# Patient Record
Sex: Male | Born: 1995 | Hispanic: Yes | Marital: Single | Smoking: Former smoker
Health system: Southern US, Community
[De-identification: ages and names within clinical notes are randomized; demographics above are authoritative.]

## PROBLEM LIST (undated history)

## (undated) DIAGNOSIS — R519 Headache, unspecified: Secondary | ICD-10-CM

## (undated) DIAGNOSIS — F319 Bipolar disorder, unspecified: Secondary | ICD-10-CM

---
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
## 2015-06-22 ENCOUNTER — Ambulatory Visit (INDEPENDENT_AMBULATORY_CARE_PROVIDER_SITE_OTHER): Payer: Self-pay | Admitting: Family Medicine

## 2015-06-22 VITALS — BP 108/64 | HR 91 | Temp 98.4°F | Resp 16 | Ht 68.0 in | Wt 144.8 lb

## 2015-06-22 DIAGNOSIS — N179 Acute kidney failure, unspecified: Secondary | ICD-10-CM

## 2015-06-22 DIAGNOSIS — R5381 Other malaise: Secondary | ICD-10-CM

## 2015-06-22 DIAGNOSIS — J029 Acute pharyngitis, unspecified: Secondary | ICD-10-CM

## 2015-06-22 LAB — POCT URINALYSIS DIPSTICK
Glucose, UA: NEGATIVE
KETONES UA: 15
Leukocytes, UA: NEGATIVE
Nitrite, UA: NEGATIVE
PROTEIN UA: 30
UROBILINOGEN UA: 1
pH, UA: 5

## 2015-06-22 LAB — POCT CBC
Granulocyte percent: 69.8 %G (ref 37–80)
HEMATOCRIT: 53.4 % (ref 43.5–53.7)
Hemoglobin: 17.4 g/dL (ref 14.1–18.1)
LYMPH, POC: 2.6 (ref 0.6–3.4)
MCH, POC: 30.2 pg (ref 27–31.2)
MCHC: 32.7 g/dL (ref 31.8–35.4)
MCV: 92.4 fL (ref 80–97)
MID (CBC): 1.1 — AB (ref 0–0.9)
MPV: 8.1 fL (ref 0–99.8)
PLATELET COUNT, POC: 296 10*3/uL (ref 142–424)
POC GRANULOCYTE: 8.4 — AB (ref 2–6.9)
POC LYMPH %: 21.4 % (ref 10–50)
POC MID %: 8.8 % (ref 0–12)
RBC: 5.77 M/uL (ref 4.69–6.13)
RDW, POC: 14.1 %
WBC: 12.1 10*3/uL — AB (ref 4.6–10.2)

## 2015-06-22 LAB — POCT RAPID STREP A (OFFICE): RAPID STREP A SCREEN: NEGATIVE

## 2015-06-22 NOTE — Progress Notes (Addendum)
[facility] [address], [place] [address] [phone]  Date:  06/22/2015   Name:  Todd [name]   DOB:  11/12/1996   MRN:  [id number]  PCP:  No primary care provider on file.    Chief Complaint: Dizziness; Generalized Body Aches; and Sore Throat   History of Present Illness:  Todd [name] is a 19 y.o. very pleasant male patient who presents with the following:  Here today with illness- he notes onset of ST since this am, and notes "cramps everywhere."  He also noted popping in his right shoulder when he moved it today.  His muscles will cramp up intermittently It hurts to swallow or spit out mucus due ot his ST No vomiting, no diarrhea He did eat today- appetite seemed to be ok He is generally in good health No medications tried at home so far.   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  It was quite hot.   He notes some tenderness in his epigastrium and RUQ  There are no active problems to display for this patient.   History reviewed. No pertinent past medical history.  History reviewed. No pertinent past surgical history.  History  Substance Use Topics  . Smoking status: Light Tobacco Smoker  . Smokeless tobacco: Not on file  . Alcohol Use: Not on file    Family History  Problem Relation Age of Onset  . Hyperlipidemia Father   . Diabetes Maternal Grandmother     No Known Allergies  Medication list has been reviewed and updated.  No current outpatient prescriptions on file prior to visit.   No current facility-administered medications on file prior to visit.    Review of Systems:  As per HPI- otherwise negative.   Physical Examination: Filed Vitals:   06/22/15 1746  BP: 108/64  Pulse: 91  Temp: 98.4 F (36.9 C)  Resp: 16   Filed Vitals:   06/22/15 1746  Height:  (1.727 m)  Weight: 144 lb 12.8 oz (65.681 kg)   Body mass index is  22.02 kg/(m^2). Ideal Body Weight: Weight in (lb) to have BMI = 25: 164.1  GEN: WDWN, NAD, Non-toxic, A & O x 3, looks well, here with his mom today HEENT: Atraumatic, Normocephalic. Neck supple. No masses, No LAD.  Bilateral TM wnl, oropharynx normal.  PEERL,EOMI.   Ears and Nose: No external deformity. CV: RRR, No M/G/R. No JVD. No thrill. No extra heart sounds. PULM: CTA B, no wheezes, crackles, rhonchi. No retractions. No resp. distress. No accessory muscle use. ABD: S, ND, +BS. No rebound. No HSM.  He indicates minimal tenderness in the epigastric area and RUQ EXTR: No c/c/e NEURO Normal gait.  PSYCH: Normally interactive. Conversant. Not depressed or anxious appearing.  Calm demeanor.   Results for orders placed or performed in visit on 06/22/15  POCT rapid strep A  Result Value Ref Range   Rapid Strep A Screen Negative Negative  POCT CBC  Result Value Ref Range   WBC 12.1 (A) 4.6 - 10.2 K/uL   Lymph, poc 2.6 0.6 - 3.4   POC LYMPH PERCENT 21.4 10 - 50 %L   MID (cbc) 1.1 (A) 0 - 0.9   POC MID % 8.8 0 - 12 %M   POC Granulocyte 8.4 (A) 2 - 6.9   Granulocyte percent 69.8 37 - 80 %G   RBC 5.77 4.69 -  6.13 M/uL   Hemoglobin 17.4 14.1 - 18.1 g/dL   HCT, POC 16.153.4 09.643.5 - 53.7 %   MCV 92.4 80 - 97 fL   MCH, POC 30.2 27 - 31.2 pg   MCHC 32.7 31.8 - 35.4 g/dL   RDW, POC 04.514.1 %   Platelet Count, POC 296 142 - 424 K/uL   MPV 8.1 0 - 99.8 fL  POCT urinalysis dipstick  Result Value Ref Range   Color, UA yellow    Clarity, UA cloudy    Glucose, UA neg    Bilirubin, UA mod    Ketones, UA 15    Spec Grav, UA >=1.030    Blood, UA trace    pH, UA 5.0    Protein, UA 30    Urobilinogen, UA 1.0    Nitrite, UA neg    Leukocytes, UA Negative Negative    Assessment and Plan: Sore throat - Plan: POCT rapid strep A  Malaise - Plan: POCT CBC, POCT urinalysis dipstick, Basic metabolic panel  Here today with ST, as well as muscle cramps. 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  He is taking PO normally so IV not needed, but encouraged hydration.   Check BMP for renal function Note to be OOW tomorrow- if not doing well in the am they are to RTC to see me  Signed Abbe AmsterdamJessica Camari Wisham, MD  Called 7/7- received his BMP, called him,  He is overall feeling better.  Rested today, still has a ST.  Asked him to come and see me Saturday am so I can recheck his BMP and he agreed     Chemistry      Component Value Date/Time   NA 140 06/22/2015 1826   K 4.9 06/22/2015 1826   CL 97 06/22/2015 1826   CO2 28 06/22/2015 1826   BUN 24* 06/22/2015 1826   CREATININE 1.88* 06/22/2015 1826      Component Value Date/Time   CALCIUM 10.8* 06/22/2015 1826     He came in for a recheck on 7/9- feeling much better.  Aches and cramps are gone. He is trying to hydrate and get enough rest  Repeat BMP is normal- called to let him know. Follow-up if any further concerns     Chemistry      Component Value Date/Time   NA 139 06/25/2015 1403   K 4.5 06/25/2015 1403   CL 105 06/25/2015 1403   CO2 26 06/25/2015 1403   BUN 11 06/25/2015 1403   CREATININE 0.85 06/25/2015 1403      Component Value Date/Time   CALCIUM 9.1 06/25/2015 1403

## 2015-06-22 NOTE — Patient Instructions (Signed)
I think that you are dehydrated- please rest and drink plenty of fluids including gatorade. If you are not feeling better tomorrow please come back and see me in the morning Otherwise I will be in touch when I get the rest of your lab results

## 2015-06-23 ENCOUNTER — Telehealth: Payer: Self-pay

## 2015-06-23 LAB — BASIC METABOLIC PANEL
BUN: 24 mg/dL — AB (ref 6–23)
CHLORIDE: 97 meq/L (ref 96–112)
CO2: 28 meq/L (ref 19–32)
Calcium: 10.8 mg/dL — ABNORMAL HIGH (ref 8.4–10.5)
Creat: 1.88 mg/dL — ABNORMAL HIGH (ref 0.50–1.35)
GLUCOSE: 74 mg/dL (ref 70–99)
POTASSIUM: 4.9 meq/L (ref 3.5–5.3)
Sodium: 140 mEq/L (ref 135–145)

## 2015-06-23 NOTE — Telephone Encounter (Signed)
Patient would like to be called on his mothers phone as soon as his lab results are in because his phone is currently turned off. Her number is [phone].

## 2015-06-25 ENCOUNTER — Other Ambulatory Visit: Payer: Self-pay

## 2015-06-25 DIAGNOSIS — N179 Acute kidney failure, unspecified: Secondary | ICD-10-CM

## 2015-06-25 LAB — BASIC METABOLIC PANEL
BUN: 11 mg/dL (ref 6–23)
CHLORIDE: 105 meq/L (ref 96–112)
CO2: 26 meq/L (ref 19–32)
CREATININE: 0.85 mg/dL (ref 0.50–1.35)
Calcium: 9.1 mg/dL (ref 8.4–10.5)
GLUCOSE: 129 mg/dL — AB (ref 70–99)
POTASSIUM: 4.5 meq/L (ref 3.5–5.3)
Sodium: 139 mEq/L (ref 135–145)

## 2015-06-25 NOTE — Addendum Note (Signed)
Addended by: Abbe AmsterdamOPLAND, Adonus Uselman C on: 06/25/2015 01:29 PM   Modules accepted: Orders

## 2016-02-16 ENCOUNTER — Ambulatory Visit (INDEPENDENT_AMBULATORY_CARE_PROVIDER_SITE_OTHER): Payer: Self-pay | Admitting: Internal Medicine

## 2016-02-16 ENCOUNTER — Encounter: Payer: Self-pay | Admitting: Internal Medicine

## 2016-02-16 VITALS — BP 116/70 | HR 82 | Ht 67.0 in | Wt 150.0 lb

## 2016-02-16 DIAGNOSIS — Z23 Encounter for immunization: Secondary | ICD-10-CM

## 2016-02-16 NOTE — Patient Instructions (Addendum)
Call [employer] and see if you can get in to learn an instrument. Introduced to [employer] for possible mentoring.
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
## 2016-02-20 ENCOUNTER — Ambulatory Visit: Payer: No Typology Code available for payment source | Admitting: Internal Medicine

## 2016-02-20 ENCOUNTER — Encounter: Payer: Self-pay | Admitting: Internal Medicine

## 2016-03-01 ENCOUNTER — Other Ambulatory Visit (INDEPENDENT_AMBULATORY_CARE_PROVIDER_SITE_OTHER): Payer: Self-pay

## 2016-03-01 DIAGNOSIS — Z0000 Encounter for general adult medical examination without abnormal findings: Secondary | ICD-10-CM

## 2016-03-02 LAB — LIPID PANEL W/O CHOL/HDL RATIO
CHOLESTEROL TOTAL: 164 mg/dL (ref 100–169)
HDL: 48 mg/dL (ref >39)
LDL Calculated: 106 mg/dL (ref 0–109)
TRIGLYCERIDES: 48 mg/dL (ref 0–89)
VLDL CHOLESTEROL CAL: 10 mg/dL (ref 5–40)

## 2016-03-15 ENCOUNTER — Encounter (HOSPITAL_COMMUNITY): Payer: Self-pay | Admitting: *Deleted

## 2016-03-15 ENCOUNTER — Ambulatory Visit (INDEPENDENT_AMBULATORY_CARE_PROVIDER_SITE_OTHER): Payer: Self-pay | Admitting: Licensed Clinical Social Worker

## 2016-03-15 ENCOUNTER — Emergency Department (HOSPITAL_COMMUNITY)
Admission: EM | Admit: 2016-03-15 | Discharge: 2016-03-16 | Disposition: A | Discharge status: Other Acute Inpatient Hospital | Payer: Self-pay

## 2016-03-15 DIAGNOSIS — F23 Brief psychotic disorder: Secondary | ICD-10-CM

## 2016-03-15 DIAGNOSIS — F29 Unspecified psychosis not due to a substance or known physiological condition: Secondary | ICD-10-CM

## 2016-03-15 DIAGNOSIS — F17210 Nicotine dependence, cigarettes, uncomplicated: Secondary | ICD-10-CM | POA: Insufficient documentation

## 2016-03-15 LAB — CBC WITH DIFFERENTIAL/PLATELET
Basophils Absolute: 0 10*3/uL (ref 0.0–0.1)
Basophils Relative: 0 %
EOS ABS: 0.1 10*3/uL (ref 0.0–0.7)
Eosinophils Relative: 2 %
HEMATOCRIT: 42.7 % (ref 39.0–52.0)
HEMOGLOBIN: 14.4 g/dL (ref 13.0–17.0)
LYMPHS ABS: 3.5 10*3/uL (ref 0.7–4.0)
Lymphocytes Relative: 49 %
MCH: 30.7 pg (ref 26.0–34.0)
MCHC: 33.7 g/dL (ref 30.0–36.0)
MCV: 91 fL (ref 78.0–100.0)
MONOS PCT: 8 %
Monocytes Absolute: 0.5 10*3/uL (ref 0.1–1.0)
NEUTROS ABS: 2.9 10*3/uL (ref 1.7–7.7)
NEUTROS PCT: 41 %
Platelets: 234 10*3/uL (ref 150–400)
RBC: 4.69 MIL/uL (ref 4.22–5.81)
RDW: 12.3 % (ref 11.5–15.5)
WBC: 7 10*3/uL (ref 4.0–10.5)

## 2016-03-15 LAB — COMPREHENSIVE METABOLIC PANEL
ALBUMIN: 4.4 g/dL (ref 3.5–5.0)
ALK PHOS: 60 U/L (ref 38–126)
ALT: 15 U/L — ABNORMAL LOW (ref 17–63)
AST: 21 U/L (ref 15–41)
Anion gap: 9 (ref 5–15)
BUN: 12 mg/dL (ref 6–20)
CO2: 25 mmol/L (ref 22–32)
Calcium: 9.1 mg/dL (ref 8.9–10.3)
Chloride: 104 mmol/L (ref 101–111)
Creatinine, Ser: 1 mg/dL (ref 0.61–1.24)
GFR calc non Af Amer: >60 mL/min (ref >60)
GLUCOSE: 79 mg/dL (ref 65–99)
POTASSIUM: 3.5 mmol/L (ref 3.5–5.1)
SODIUM: 138 mmol/L (ref 135–145)
TOTAL PROTEIN: 6.8 g/dL (ref 6.5–8.1)
Total Bilirubin: 0.8 mg/dL (ref 0.3–1.2)

## 2016-03-15 LAB — RAPID URINE DRUG SCREEN, HOSP PERFORMED
Amphetamines: NOT DETECTED
BARBITURATES: NOT DETECTED
Benzodiazepines: NOT DETECTED
Cocaine: NOT DETECTED
Opiates: NOT DETECTED
TETRAHYDROCANNABINOL: NOT DETECTED

## 2016-03-15 LAB — ACETAMINOPHEN LEVEL

## 2016-03-15 LAB — SALICYLATE LEVEL: Salicylate Lvl: <4 mg/dL (ref 2.8–30.0)

## 2016-03-15 LAB — ETHANOL: Alcohol, Ethyl (B): <5 mg/dL (ref <5)

## 2016-03-15 MED ORDER — IBUPROFEN 400 MG PO TABS: 600.0000 mg | ORAL_TABLET | Freq: Three times a day (TID) | ORAL | Status: DC | PRN

## 2016-03-15 MED ORDER — ACETAMINOPHEN 325 MG PO TABS: 650.0000 mg | ORAL_TABLET | ORAL | Status: DC | PRN

## 2016-03-15 MED ORDER — ZOLPIDEM TARTRATE 5 MG PO TABS: 5.0000 mg | ORAL_TABLET | Freq: Every evening | ORAL | Status: DC | PRN

## 2016-03-15 MED ORDER — LORAZEPAM 1 MG PO TABS: 1.0000 mg | ORAL_TABLET | Freq: Three times a day (TID) | ORAL | Status: DC | PRN

## 2016-03-15 NOTE — ED Notes (Addendum)
Pt is accompanied by his mother and the patient states that he has been hearing voices and the voice of God and states that he is an Chief Technology Officerangel.  Pt states the voices are telling him to be happy.  Pt denies SI or HI.   Pt states he has been hearing voices for a while. Per interpretor, mother states that for one week her son has stated he has been hearing GOD and telling them he is the angel and they are demons.  No violence

## 2016-03-15 NOTE — ED Provider Notes (Signed)
CSN: [id number]     Arrival date & time 03/15/16  1833 History   First MD Initiated Contact with Patient 03/15/16 1938     Chief Complaint  Patient presents with  . Hallucinations     (Consider location/radiation/quality/duration/timing/severity/associated sxs/prior Treatment) HPI  20 year old male presents with his mother for psychiatric evaluation. History taken from the patient who speaks [place] as well as the mother who speaks Spanish through the interpreter phone. Patient has apparently been seeing God over the last 1 week. He states that he himself is an Chief Technology Officer. He has been describing his family as demons. Patient has been hearing voices, both God and other voices. Mom states this is all new over the last 1 week. No anger or violence. No suicidal or homicidal thoughts. No prior psychiatric issues. Has not had fevers, chills, headaches, or other infectious symptoms.  History reviewed. No pertinent past medical history. History reviewed. No pertinent past surgical history. Family History  Problem Relation Age of Onset  . Hyperlipidemia Father   . Diabetes Maternal Grandmother   . Alcohol abuse Paternal Grandfather    Social History  Substance Use Topics  . Smoking status: Light Tobacco Smoker -- 0.00 packs/day    Types: Cigarettes  . Smokeless tobacco: Never Used  . Alcohol Use: 0.0 oz/week    0 Standard drinks or equivalent per week     Comment: Beer and Tequila on weekends.  3 beers and 4 shots of Tequila over 2 weekends per month    Review of Systems  Constitutional: Negative for fever.  Respiratory: Negative for shortness of breath.   Cardiovascular: Negative for chest pain.  Gastrointestinal: Negative for vomiting and abdominal pain.  Musculoskeletal: Negative for neck pain.  Neurological: Negative for headaches.  Psychiatric/Behavioral: Positive for hallucinations. Negative for suicidal ideas and self-injury.  All other systems reviewed and are  negative.     Allergies  Review of patient's allergies indicates no known allergies.  Home Medications   Prior to Admission medications   Not on File   BP 128/70 mmHg  Pulse 67  Temp(Src) 98 F (36.7 C) (Oral)  Resp 20  SpO2 100% Physical Exam  Constitutional: He is oriented to person, place, and time. He appears well-developed and well-nourished.  HENT:  Head: Normocephalic and atraumatic.  Right Ear: External ear normal.  Left Ear: External ear normal.  Nose: Nose normal.  Eyes: EOM are normal. Pupils are equal, round, and reactive to light. Right eye exhibits no discharge. Left eye exhibits no discharge.  Neck: Normal range of motion. Neck supple.  Cardiovascular: Normal rate, regular rhythm, normal heart sounds and intact distal pulses.   Pulmonary/Chest: Effort normal and breath sounds normal.  Abdominal: Soft. There is no tenderness.  Musculoskeletal: He exhibits no edema.  Neurological: He is alert and oriented to person, place, and time.  Skin: Skin is warm and dry.  Psychiatric: He has a normal mood and affect. His speech is normal. He expresses no homicidal and no suicidal ideation.  Patient is not having active hallucinations  Nursing note and vitals reviewed.   ED Course  Procedures (including critical care time) Labs Review Labs Reviewed  ACETAMINOPHEN LEVEL - Abnormal; Notable for the following:    Acetaminophen (Tylenol), Serum <10 (*)    All other components within normal limits  COMPREHENSIVE METABOLIC PANEL - Abnormal; Notable for the following:    ALT 15 (*)    All other components within normal limits  ETHANOL  SALICYLATE LEVEL  CBC  WITH DIFFERENTIAL/PLATELET  URINE RAPID DRUG SCREEN, HOSP PERFORMED    Imaging Review No results found. I have personally reviewed and evaluated these images and lab results as part of my medical decision-making.   EKG Interpretation None      MDM   Final diagnoses:  Acute psychosis    Patient is  currently calm and resting comfortably in the stretcher. However his history from patient and mother is concerning for an acute psychotic break. This appears to be his first episode. Lab work is unremarkable. Very low suspicion for a brain tumor, meningitis, or other acute infection or medical cause. Will consult psychiatry and he will likely need to be admitted for acute psychosis. He is currently voluntary.    Pricilla LovelessScott Eldridge Marcott, MD 03/15/16 [phone]
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
## 2016-03-15 NOTE — ED Notes (Signed)
Provided patient with blue paper scrubs, Malaw[place]urkey sandwich meal with applesauce, water, and juice. Mother had left for a while, but has returned. Guidelines for [facility] given to patient. Patient shared with mother.

## 2016-03-15 NOTE — ED Notes (Addendum)
Brought pt back to triage room and ask him "whats going on today"  And pt stated "dont worry about it."  Advised pt he could either work with us and allow us to help him or we could get others involved.

## 2016-03-15 NOTE — ED Notes (Signed)
TTS at bedside at this time. Patient agreeable to mother and sister being in room during psych evaluation.

## 2016-03-15 NOTE — ED Notes (Signed)
Dr. Goldston at bedside at this time.  

## 2016-03-16 ENCOUNTER — Inpatient Hospital Stay
Admission: RE | Admit: 2016-03-16 | Discharge: 2016-03-24 | DRG: 885 | Disposition: A | Discharge status: Home / Self Care | Payer: Self-pay | Source: Intra-hospital | Attending: Psychiatry | Admitting: Psychiatry

## 2016-03-16 DIAGNOSIS — R441 Visual hallucinations: Secondary | ICD-10-CM | POA: Diagnosis present

## 2016-03-16 DIAGNOSIS — F32A Depression, unspecified: Secondary | ICD-10-CM

## 2016-03-16 DIAGNOSIS — F17210 Nicotine dependence, cigarettes, uncomplicated: Secondary | ICD-10-CM | POA: Diagnosis present

## 2016-03-16 DIAGNOSIS — F302 Manic episode, severe with psychotic symptoms: Secondary | ICD-10-CM | POA: Diagnosis present

## 2016-03-16 DIAGNOSIS — F17200 Nicotine dependence, unspecified, uncomplicated: Secondary | ICD-10-CM | POA: Diagnosis present

## 2016-03-16 DIAGNOSIS — Z62810 Personal history of physical and sexual abuse in childhood: Secondary | ICD-10-CM | POA: Diagnosis present

## 2016-03-16 DIAGNOSIS — Z833 Family history of diabetes mellitus: Secondary | ICD-10-CM

## 2016-03-16 DIAGNOSIS — F329 Major depressive disorder, single episode, unspecified: Secondary | ICD-10-CM

## 2016-03-16 DIAGNOSIS — Z818 Family history of other mental and behavioral disorders: Secondary | ICD-10-CM

## 2016-03-16 DIAGNOSIS — F1290 Cannabis use, unspecified, uncomplicated: Secondary | ICD-10-CM | POA: Diagnosis present

## 2016-03-16 DIAGNOSIS — F312 Bipolar disorder, current episode manic severe with psychotic features: Principal | ICD-10-CM | POA: Diagnosis present

## 2016-03-16 DIAGNOSIS — F1210 Cannabis abuse, uncomplicated: Secondary | ICD-10-CM | POA: Insufficient documentation

## 2016-03-16 DIAGNOSIS — R440 Auditory hallucinations: Secondary | ICD-10-CM | POA: Diagnosis present

## 2016-03-16 DIAGNOSIS — Z811 Family history of alcohol abuse and dependence: Secondary | ICD-10-CM

## 2016-03-16 DIAGNOSIS — F29 Unspecified psychosis not due to a substance or known physiological condition: Secondary | ICD-10-CM

## 2016-03-16 MED ORDER — MAGNESIUM HYDROXIDE 400 MG/5ML PO SUSP: 30.0000 mL | Freq: Every day | ORAL | Status: DC | PRN

## 2016-03-16 MED ORDER — RISPERIDONE 1 MG PO TABS
2.0000 mg | ORAL_TABLET | Freq: Every day | ORAL | Status: DC
  Administered 2016-03-17 – 2016-03-18 (×3): 2 mg via ORAL
  Filled 2016-03-16 (×4): qty 2

## 2016-03-16 MED ORDER — HALOPERIDOL 2 MG PO TABS: 2.0000 mg | ORAL_TABLET | Freq: Four times a day (QID) | ORAL | Status: DC | PRN

## 2016-03-16 MED ORDER — LORAZEPAM 1 MG PO TABS: 1.0000 mg | ORAL_TABLET | Freq: Four times a day (QID) | ORAL | Status: DC | PRN

## 2016-03-16 MED ORDER — ALUM & MAG HYDROXIDE-SIMETH 200-200-20 MG/5ML PO SUSP: 30.0000 mL | ORAL | Status: DC | PRN

## 2016-03-16 MED ORDER — ACETAMINOPHEN 325 MG PO TABS
650.0000 mg | ORAL_TABLET | Freq: Four times a day (QID) | ORAL | Status: DC | PRN
  Administered 2016-03-22: 650 mg via ORAL
  Filled 2016-03-16: qty 2

## 2016-03-16 NOTE — ED Notes (Signed)
Patient instructed to change into paper scrubs.

## 2016-03-16 NOTE — BH Assessment (Signed)
Patient has been accepted to [facility].  Accepting physician is Dr. Lucianne MussKumar.  Attending Physician will be Dr. Jennet MaduroPucilowska.  Patient has been assigned to room 304, by [facility] Charge Nurse [place]hy[place]is .  Call report to [phone].  Representative/Transfer Coordinator is Dyanne Ihaalvin  [facility] Staff Lindie Spruce(Meghan, TTS/Social Worker) made aware of acceptance.

## 2016-03-16 NOTE — ED Notes (Signed)
Faxed IVC paperwork, called [facility] for transport.

## 2016-03-16 NOTE — ED Notes (Signed)
Called [employer] to order regular meal breakfast tray for patient.

## 2016-03-16 NOTE — Progress Notes (Signed)
   THERAPY PROGRESS NOTE  Session Time: 29mn  Participation Level: Minimal  Behavioral Response: Casual and Well GroomedAlertEuthymic  Type of Therapy: Family Therapy  Treatment Goals addressed: Diagnosis: Brief assessment for hallucinations/delusions  Interventions: Supportive  Summary: Todd Schoffstallis a 20y.o. male who presents with a euthymic mood and flat affect. He arrived at the appointment with his mother and then his father later joined the session. Mother reported that she had tricked JLatviainto attending the appointment by lying about the appointment purpose. She shared that she has seen "major changes" in Todd Canyonand that she is worried about his recent behaviors. She shared that he has been withdrawn and "in his own world" over the past few months. She reported that starting on Friday, March 24, he began acting very strange, saying that he was an aBuilding services engineerand that he can see demons inside of people. Mother shared that he said that he can see God. Mother expressed concerns that these behaviors are due to drugs, as JCaswellis currently on probation for possession of marijuana and Xanax. Todd [name] and Todd [name] then spoke separately. JVedhconfirmed that he thinks he is an aBuilding services engineer sees God, and can see demons inside of others. He reported that he hears voices, which he recognizes as people that he knows, both alive and dead. He did not answer in regards to whether or not the voices are giving him commands. Todd [name] that he "feels the power" and that he feels very good. He denied feeling upset or scared about these new feelings and experiences. Todd [name] not answer questions about suicidality or homicidality, saying only, "Don't worry about it." Todd [name] held constant eye contact with Todd [name] and occasionally smiled, although he had mostly very flat affect. Mother and father then spoke with Todd [name] separately with Todd [name]'s permission. Mother and father both asserted that they felt this was a result of  drugs but appeared receptive to Todd [name] feedback that Todd [name]'s current symptoms are more likely a psychotic episode or the beginning of schizophrenia. Parents agreed to take Todd [name] Todd Pineto be evaluated.  After the session, Todd [name] family returned to clinic because Todd Sessionshad recommended that JRhyderbe taken to the ED. Todd [name] confirmed that he should be evaluated at ED, and mother confirmed that she would take him.  Suicidal/Homicidal: NA: Uknown, refused to answer  Therapist Response: Todd [name] first met with JGenerosoand his mother. Todd [name] used basic supportive counseling techniques as mother shared about her concerns. Todd [name] then met separately with JSpenserto assess symptoms. Todd [name] assessed for visual and auditory hallucinations, both confirmed. Todd [name] assessed for suicidal and homicidal ideations, which Todd [name] not directly answer. Todd [name] assessed for drug use, which JShylerwould not answer. Todd [name] met with mother and father. Todd [name] shared concerns that JLeviwas experiencing a crisis and [facility]   Plan: Return again in ? weeks.  Diagnosis: Axis I: Psychotic Disorder NOS    Axis II: No diagnosis    Todd [name] Todd [name] 03/16/2016

## 2016-03-16 NOTE — ED Notes (Signed)
Security wanding patient at this time 

## 2016-03-16 NOTE — Progress Notes (Signed)
Writer faxed patient's IVC papers [facility]  Melbourne Abtsatia Kempton Milne, LCSWA Disposition staff 03/16/2016 5:41 PM

## 2016-03-16 NOTE — ED Notes (Signed)
Mother at bedside.

## 2016-03-16 NOTE — ED Notes (Addendum)
Patient is being non-compliant. Patient had put clothes in bags, and was wearing paper scrubs. Clothes remained in the room. Patient redressed. When RN attempted to discuss with patient that patient needs to change back into paper scrubs, patient refuses to speak. Remains nonverbal.  Attempted to call mother. Phone number listed in chart goes to voicemail. Message left.

## 2016-03-16 NOTE — ED Notes (Signed)
[facility] has arrived to [facility]; IVC paper work has been turned over to [employer]Sheriff Perry on arrival.

## 2016-03-17 LAB — HEMOGLOBIN A1C: Hgb A1c MFr Bld: 5.2 % (ref 4.0–6.0)

## 2016-03-17 NOTE — Tx Team (Signed)
Initial Interdisciplinary Treatment Plan   PATIENT STRESSORS: "parents think I'm not acting normal"   PATIENT STRENGTHS: Active sense of humor Average or above average intelligence Supportive family/friends   PROBLEM LIST: Problem List/Patient Goals Date to be addressed Date deferred Reason deferred Estimated date of resolution  Visual and Auditory Hallucinations 03/16/16     Psychosis 03/16/16     "I want time to write and be alone" 03/16/16                                          DISCHARGE CRITERIA:  Improved stabilization in mood, thinking, and/or behavior Motivation to continue treatment in a less acute level of care  PRELIMINARY DISCHARGE PLAN: Outpatient therapy  PATIENT/FAMIILY INVOLVEMENT: This treatment plan has been presented to and reviewed with the patient, Edman CircleJuan Francisco Hernandez Avalos.  The patient and family have been given the opportunity to ask questions and make suggestions.  Montgomery Favor B Crandall Harvel 03/17/2016, 3:26 AM
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
## 2016-03-17 NOTE — Progress Notes (Signed)
D: Pt received from [facility]. Pt had no skin issues. Gym shorts with string was found on pt under scrubs during assessment.  Patient alert and oriented x4. Patient denies SI/HI. Pt endorses visual hallucinations " God, he's beautiful, bright and shiny." Pt also mentioned see "Brooke DareKing little G" who pt identified as a rapper. Pt indicated he was hearing voices but when asked stated "don't worry about it." Pt affect is flat. Pt avoids eye contact, and seems distant when communicating. Pt possibly thought blocking, as pt has delayed responses to questions. Pt also laughs at inappropriate times, and does not identify the source. Pt gives answer mostly in the for of "yes/no" and elaborated very little beyond it. Pt was preoccupied with being able to have his notebook when he first came on the unit. Pt appears to have some insight into the visual hallucinations, but also stated "I'll still see them" when talking about treatment. When questioned about having access to firearms pt stated "not yet, but I will" and pt would not elaborate on what the meant. Pt had no complaints A: Skin and contraband performed with Loyola MastKristen RN. String was removed from pt shorts, and placed with belongings to go into locker. Reviewed admission information with pt. Educated pt on unit policies. Oriented pt to unit. Offered sandwich tray. Offered active listening and support. Provided therapeutic communication. Administered scheduled medications.  R: Pt calm pleasant and cooperative. Pt continues to have no complaints. Pt medication compliant. Will continue Q15 min. checks. Safety maintained.

## 2016-03-17 NOTE — Plan of Care (Signed)
Problem: Alteration in thought process Goal: STG-Patient is able to follow short directions Outcome: Progressing Patient was able to talk about his meals and asked about snack

## 2016-03-17 NOTE — Progress Notes (Signed)
D:  Patient is alert but questionably oriented on the unit this shift.  Patient's orientation is difficult to assess as he is unwilling or unable to respond appropriately to this writer's questions.  Patient did not attend group this shift but did go outside during social work group.  Patient denies suicidal ideation or homicidal ideation currently.  Patient endorses "a little" auditory or visual hallucinations at the present time.   A:  Emotional support and encouragement are provided.  Patient is maintained on q.15 minute safety checks.  Patient is informed to notify staff with questions or concerns. R:  Patient is cooperative with treatment plan today.  Patient is calm and cooperative on the unit at this time.  Patient interacts minimally with others on the unit this shift.  Patient contracts for safety at this time.  Patient remains safe at this time.
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
## 2016-03-17 NOTE — [facility] Group Notes (Signed)
[facility] Group Notes:  (Nursing/MHT/Case Management/Adjunct)  Date:  03/17/2016  Time:  9:19 AM  Type of Therapy:  Community   Participation Level:  Did Not Attend  Todd [name] De'Chelle Abdoulaye Drum 03/17/2016, 9:19 AM 

## 2016-03-17 NOTE — [facility] Group Notes (Signed)
[facility] LCSW Group Therapy  03/17/2016 3:10 PM  Type of Therapy:  Group Therapy  Participation Level:  Did Not Attend  Modes of Intervention:  Discussion, Education, Socialization and Support  Summary of Progress/Problems: Pt will identify unhealthy thoughts and how they impact their emotions and behavior. Pt will be encouraged to discuss these thoughts, emotions and behaviors with the group.   Cristie Mckinney L Alford Gamero MSW, LCSWA  03/17/2016, 3:10 PM  

## 2016-03-17 NOTE — Plan of Care (Signed)
Problem: Alteration in thought process Goal: LTG-Patient behavior demonstrates decreased signs psychosis (Patient behavior demonstrates decreased signs of psychosis to the point the patient is safe to return home and continue treatment in an outpatient setting.)  Outcome: Not Progressing Pt continues to endorse visual hallucinations and auditory hallucinations        

## 2016-03-17 NOTE — Plan of Care (Signed)
Problem: Ineffective individual coping Goal: LTG: Patient will report a decrease in negative feelings Outcome: Progressing Patient reports feeling better today than he did yesterday     

## 2016-03-18 ENCOUNTER — Encounter: Payer: Self-pay | Admitting: Psychiatry

## 2016-03-18 DIAGNOSIS — F32A Depression, unspecified: Secondary | ICD-10-CM

## 2016-03-18 DIAGNOSIS — F1210 Cannabis abuse, uncomplicated: Secondary | ICD-10-CM | POA: Insufficient documentation

## 2016-03-18 DIAGNOSIS — F329 Major depressive disorder, single episode, unspecified: Secondary | ICD-10-CM

## 2016-03-18 DIAGNOSIS — F29 Unspecified psychosis not due to a substance or known physiological condition: Secondary | ICD-10-CM

## 2016-03-18 LAB — PROLACTIN: PROLACTIN: 34.6 ng/mL — AB (ref 4.0–15.2)

## 2016-03-18 MED ORDER — RISPERIDONE 1 MG PO TABS
1.0000 mg | ORAL_TABLET | Freq: Every day | ORAL | Status: DC
  Administered 2016-03-18 – 2016-03-19 (×2): 1 mg via ORAL
  Filled 2016-03-18 (×2): qty 1

## 2016-03-18 MED ORDER — CITALOPRAM HYDROBROMIDE 20 MG PO TABS
20.0000 mg | ORAL_TABLET | Freq: Every day | ORAL | Status: DC
  Administered 2016-03-18 – 2016-03-23 (×6): 20 mg via ORAL
  Filled 2016-03-18 (×6): qty 1
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
## 2016-03-18 NOTE — [facility] Group Notes (Signed)
[facility] LCSW Group Therapy  03/18/2016 3:23 PM  Type of Therapy:  Group Therapy  Participation Level:  Minimal  Participation Quality:  Attentive  Affect:  Flat  Cognitive:  Alert  Insight:  Limited  Engagement in Therapy:  Limited  Modes of Intervention:  Discussion, Education, Socialization and Support  Summary of Progress/Problems: Self esteem: Patients discussed self esteem and how it impacts them. They discussed what aspects in their lives has influenced their self esteem. They were challenged to identify changes that are needed in order to improve self esteem. Pt attended a group and stayed the entire time. Pt sat quietly and listened to other group members.   Giomar Gusler L Shaconda Hajduk MSW, LCSWA  03/18/2016, 3:23 PM   

## 2016-03-18 NOTE — [facility] Group Notes (Signed)
[facility] Group Notes:  (Nursing/MHT/Case Management/Adjunct)  Date:  03/18/2016  Time:  5:50 PM  Type of Therapy:  Psychoeducational Skills  Participation Level:  Did Not Attend  Caleb Decock Travis Niall Illes 03/18/2016, 5:50 PM 

## 2016-03-18 NOTE — Plan of Care (Signed)
Problem: Alteration in thought process Goal: LTG-Patient behavior demonstrates decreased signs psychosis (Patient behavior demonstrates decreased signs of psychosis to the point the patient is safe to return home and continue treatment in an outpatient setting.)  Outcome: Not Progressing Pt continues to endorse visual hallucinations and auditory hallucinations

## 2016-03-18 NOTE — [facility] Group Notes (Signed)
[facility] Group Notes:  (Nursing/MHT/Case Management/Adjunct)  Date:  03/18/2016  Time:  2:59 AM  Type of Therapy:  Group Therapy  Participation Level:  Did Not Attend  Summary of Progress/Problems:  Todd [name] [name] 03/18/2016, 2:59 AM

## 2016-03-18 NOTE — Plan of Care (Signed)
Problem: Ineffective individual coping Goal: LTG: Patient will report a decrease in negative feelings Outcome: Progressing Patient with sad affect, cooperative behavior. Patient heard talking to self in room.

## 2016-03-18 NOTE — Progress Notes (Signed)
D: Observed pt in day room visiting with mother and sister. Patient alert and oriented x4. Patient denies SI/HI. Pt endorses hallucinations of "God and little king G." Pt endorses auditory hallucinations stating that say "nothing bad" and when asked to elaborate stated "Don't worry about it." Pt affect is flat. [name] mother had questions about pt medication and discharge plan. Pt mother speaks Spanish , but pt's sister was able to translate for mother. Pt gave permission to writer to share information with mother. Pt stayed in room most of evening. Pt stated that "pills are making me tired." Pt rated depression 6/10 and anxiety 6/10 related to "being alone."  A: Offered active listening and support. Provided therapeutic communication. Administered scheduled medications. Discussed medication with mother and gave patient education handout about risperdal. Gave mother number to unit and advised to call during week when treatment plan would be further developed. Encouraged to call unit with any questions or concerns.  R: Pt pleasant and cooperative. Pt interacted minimally with staff and peers. Pt medication compliant. Will continue Q15 min. checks. Safety maintained.

## 2016-03-19 ENCOUNTER — Encounter: Payer: Self-pay | Admitting: Psychiatry

## 2016-03-19 DIAGNOSIS — F17200 Nicotine dependence, unspecified, uncomplicated: Secondary | ICD-10-CM | POA: Diagnosis present

## 2016-03-19 DIAGNOSIS — F302 Manic episode, severe with psychotic symptoms: Secondary | ICD-10-CM

## 2016-03-19 MED ORDER — RISPERIDONE 3 MG PO TABS
3.0000 mg | ORAL_TABLET | Freq: Two times a day (BID) | ORAL | Status: DC
  Administered 2016-03-20 – 2016-03-22 (×5): 3 mg via ORAL
  Filled 2016-03-19 (×5): qty 1

## 2016-03-19 MED ORDER — RISPERIDONE 1 MG PO TABS
2.0000 mg | ORAL_TABLET | Freq: Once | ORAL | Status: AC
  Administered 2016-03-19: 2 mg via ORAL
  Filled 2016-03-19: qty 2

## 2016-03-19 NOTE — Progress Notes (Signed)
Recreation Therapy Notes  At approximately 2:50 pm, LRT attempted assessment. Patient sleeping and would not wake up when name was called.  Todd [name],Todd [name], LRT/CTRS 03/19/2016 3:15 PM

## 2016-03-19 NOTE — Progress Notes (Signed)
Recreation Therapy Notes  Date: 04.03.17 Time: 9:55 am Location: Craft Room  Group Topic: Coping Skills  Goal Area(s) Addresses:  Patient will verbalize one positive emotion experienced in group. Patient will participate in healthy coping skill.  Behavioral Response: Attentive, Interactive  Intervention: Coloring  Activity: Patients were given coloring sheets and instructed to color. While they were coloring, LRT asked patients to think about the emotions they were experiencing and what they were thinking about.  Education: LRT educated patient on healthy coping skills.  Education Outcome: Acknowledges education/In group clarification offered  Clinical Observations/Feedback: Patient colored coloring sheet. Patient contributed to group discussion by stating what emotion he felt while he was coloring.  Jacquelynn CreeGreene,Donnie Gedeon M, LRT/CTRS 03/19/2016 4:34 PM

## 2016-03-19 NOTE — Progress Notes (Signed)
D: Patient appears flat. He denies SI/HI. He does endorse AH but can not explain them at this time. When asked about VH he stated not at this time. He denies any pain.  A: Medication given with education. Encouragement was provided.  R: Patient was compliant with medication. He has remained calm and cooperative. Safety maintained with 15 min checks.

## 2016-03-19 NOTE — Plan of Care (Signed)
Problem: Ineffective individual coping Goal: STG: Patient will remain free from self harm Outcome: Progressing Patient has remained free from harm since admission.      

## 2016-03-19 NOTE — Progress Notes (Signed)
Patient noted with flat affect, poor eye contact and was isolative to room most of the day except for meals and medications. Prompted patient three times to attend groups but he did not respond and remained under covers in bed. He stated that he was still hearing voices but refused to tell writer what the voices were about/saying. He however denied SI and contracted for safety.

## 2016-03-19 NOTE — Plan of Care (Signed)
Problem: Ineffective individual coping Goal: STG: Pt will be able to identify effective and ineffective STG: Pt will be able to identify effective and ineffective coping patterns  Outcome: Not Progressing Patient laid in bed and did not attend groups even after much encouragement.

## 2016-03-19 NOTE — Progress Notes (Signed)
[facility] MD Progress Note  03/19/2016 5:14 PM Todd [name] Todd [name]  MRN:  [id number]  Subjective: [name] is still psychotic, delusional, and hallucinating. He continues his conversations with God and believes has special powers. He does admit that with medication her thinking is somewhat clearer. He tolerates Risperdal well. He participates in programming some. There are no somatic complaints. His appetite is good and he is asking for double portions.   Principal Problem: <principal problem not specified> Diagnosis:   Patient Active Problem List   Diagnosis Date Noted  . Depression [F32.9] 03/18/2016  . Cannabis abuse [F12.10] 03/18/2016  . Psychoses [F29]   . Psychosis [F29] 03/16/2016   Total Time spent with patient: 20 minutes  Past Psychiatric History:  none.  Past Medical History: History reviewed. No pertinent past medical history. History reviewed. No pertinent past surgical history. Family History:  Family History  Problem Relation Age of Onset  . Hyperlipidemia Father   . Diabetes Maternal Grandmother   . Alcohol abuse Paternal Grandfather    Family Psychiatric  History:  father with bipolar. Social History:  History  Alcohol Use  . 0.0 oz/week  . 0 Standard drinks or equivalent per week    Comment: Beer and Tequila on weekends.  3 beers and 4 shots of Tequila over 2 weekends per month     History  Drug Use  . Yes    Comment: Marijuana:  starts and stops.  When smoking, daily.  Tested every 2 weeks due to taking prescription drug abuse--stopped in [place].  Goes throught 05/2016. Has used MJ since 6th grade.  Went to [employer] a lot daily.    Social History   Social History  . Marital Status: Single    Spouse Name: N/A  . Number of Children: 0  . 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 Smoking status: Light Tobacco Smoker -- 0.00 packs/day    Types:  Cigarettes  . Smokeless tobacco: Never Used     Comment: Smokes 1 pack every few months  . Alcohol Use: 0.0 oz/week    0 Standard drinks or equivalent per week     Comment: Beer and Tequila on weekends.  3 beers and 4 shots of Tequila over 2 weekends per month  . Drug Use: Yes     Comment: Marijuana:  starts and stops.  When smoking, daily.  Tested every 2 weeks due to taking prescription drug abuse--stopped in [place].  Goes throught 05/2016. Has used MJ since 6th grade.  Went to [employer] a lot daily.  Marland Kitchen Sexual Activity: Yes    Birth Control/ Protection: Condom   Other Topics Concern  . None   Social History Narrative   Originally from [place]   Came to [employer]. When 20 years old.     Lives with parents and 20 yo sister.    Additional Social History:                         Sleep: Fair  Appetite:  Good  Current Medications: Current Facility-Administered Medications  Medication Dose Route Frequency Provider Last Rate Last Dose  . acetaminophen (TYLENOL) tablet 650 mg  650 mg Oral Q6H PRN Darliss Ridgel, MD      . alum & mag hydroxide-simeth (MAALOX/MYLANTA) 200-200-20 MG/5ML suspension 30 mL  30 mL Oral Q4H PRN Darliss Ridgel,  MD      . citalopram (CELEXA) tablet 20 mg  20 mg Oral Daily Darliss Ridgel, MD   20 mg at 03/19/16 0905  . haloperidol (HALDOL) tablet 2 mg  2 mg Oral Q6H PRN Darliss Ridgel, MD      . LORazepam (ATIVAN) tablet 1 mg  1 mg Oral Q6H PRN Darliss Ridgel, MD      . magnesium hydroxide (MILK OF MAGNESIA) suspension 30 mL  30 mL Oral Daily PRN Darliss Ridgel, MD      . risperiDONE (RISPERDAL) tablet 1 mg  1 mg Oral Daily Darliss Ridgel, MD   1 mg at 03/19/16 0905  . risperiDONE (RISPERDAL) tablet 2 mg  2 mg Oral QHS Darliss Ridgel, MD   2 mg at 03/18/16 2203    Lab Results: No results found for this or any previous visit (from the past 48 hour(s)).  Blood Alcohol level:  Lab Results  Component Value Date   ETH <5 03/15/2016    Physical  Findings: AIMS: Facial and Oral Movements Muscles of Facial Expression: None, normal Lips and Perioral Area: None, normal Jaw: None, normal Tongue: None, normal,Extremity Movements Upper (arms, wrists, hands, fingers): None, normal Lower (legs, knees, ankles, toes): None, normal, Trunk Movements Neck, shoulders, hips: None, normal, Overall Severity Severity of abnormal movements (highest score from questions above): None, normal Incapacitation due to abnormal movements: None, normal Patient's awareness of abnormal movements (rate only patient's report): No Awareness, Dental Status Current problems with teeth and/or dentures?: No Does patient usually wear dentures?: No  CIWA:  CIWA-Ar Total: 0 COWS:  COWS Total Score: 0  Musculoskeletal: Strength & Muscle Tone: within normal limits Gait & Station: normal Patient leans: N/A  Psychiatric Specialty Exam: Review of Systems  Psychiatric/Behavioral: Positive for hallucinations.  All other systems reviewed and are negative.   Blood pressure 109/70, pulse 80, temperature 97.9 F (36.6 C), temperature source Oral, resp. rate 20, height  (1.727 m), weight 63.504 kg (140 lb).Body mass index is 21.29 kg/(m^2).  General Appearance: Fairly Groomed  Patent attorney::  Fair  Speech:  Clear and Coherent  Volume:  Normal  Mood:  Anxious  Affect:  Appropriate  Thought Process:  Disorganized  Orientation:  Full (Time, Place, and Person)  Thought Content:  Delusions, Hallucinations: Auditory and Paranoid Ideation  Suicidal Thoughts:  No  Homicidal Thoughts:  No  Memory:  Immediate;   Fair Recent;   Fair Remote;   Fair  Judgement:  Impaired  Insight:  Lacking  Psychomotor Activity:  Normal  Concentration:  Fair  Recall:  [employer] of Knowledge:Fair  Language: Fair  Akathisia:  No  Handed:  Right  AIMS (if indicated):     Assets:  Communication Skills Desire for Improvement Housing Physical Health Resilience Social  Support Vocational/Educational  ADL's:  Intact  Cognition: WNL  Sleep:  Number of Hours: 6.75   Treatment Plan Summary: Daily contact with patient to assess and evaluate symptoms and progress in treatment and Medication management   [name] is a 20 year old single Hispanic male with no significant past psychiatric history who was brought to the emergency room after being seen at [facility] with active psychotic symptoms. Toxicology screen was negative in the emergency room. The patient has never been hospitalized before and has no past psychiatric history.  1. Psychosis. The patient was started on Risperdal 2 mg by mouth nightly for psychosis. We will increase Risperdal to 3 mg  twice daily as he continues to be psychotic.  2. Metabolic syndrome screening. Total cholesterol and Hemoglobin A1c are normal. Prolactin 34.6.   3. Cannabis use disorder, Alcohol use disorder, mild, R/O Benzo Use Disorder: Toxicology screen was negative in the emergency room and the patient was not intoxicated at the time of admission. The patient was advised to abstain from alcohol and all illicit drugs as they may worsen psychosis and mood symptoms. We'll need to address outpatient substance abuse treatment at the time of discharge. He is currently being drug tested on a regular basis for probation.  4. Disposition: The patient does have a stable living situation with his parents in the [place] area. He will need psychotropic medication management follow-up appointment after discharge.   Kristine LineaJolanta Rindi Beechy, MD 03/19/2016, 5:14 PM

## 2016-03-20 MED ORDER — PALIPERIDONE PALMITATE 156 MG/ML IM SUSP
156.0000 mg | Freq: Once | INTRAMUSCULAR | Status: AC
  Administered 2016-03-24: 156 mg via INTRAMUSCULAR
  Filled 2016-03-20 (×2): qty 1

## 2016-03-20 MED ORDER — PALIPERIDONE PALMITATE 234 MG/1.5ML IM SUSP
234.0000 mg | INTRAMUSCULAR | Status: DC
  Administered 2016-03-20: 234 mg via INTRAMUSCULAR
  Filled 2016-03-20 (×2): qty 1.5

## 2016-03-20 NOTE — [facility] Group Notes (Signed)
[facility]  03/19/2016 5:13 PM  Type of Therapy:  Group Therapy  Participation Level:  Did Not Attend   Glennon MacLaws, Kimorah Ridolfi P, MSW, LCSW 03/20/2016, 5:13 PM

## 2016-03-20 NOTE — Progress Notes (Signed)
D: Information concrete. Limited insight into behavior  Patient stated slept fairlast night .Stated appetitefairand energy level hyper. Stated concentration is good . Stated on Depression scale 5, hopeless 5 and anxiety 8 . ( low 0-10 high) Denies suicidal  homicidal ideations  .  No auditory hallucinations  No pain concerns . Appropriate ADL'S. Interacting with peers and staff.  A: Encourage patient participation with unit programming . Instruction  Given on  Medication , verbalize understanding. R: Voice no other concerns. Staff continue to monitor

## 2016-03-20 NOTE — Plan of Care (Signed)
Problem: Alteration in thought process Goal: LTG-Patient has not harmed self or others in at least 2 days Outcome: Progressing Appropriate behavior  Noted on unit. attending  Unit programing

## 2016-03-20 NOTE — [facility] Group Notes (Signed)
[facility]  03/20/2016 5:13 PM  Type of Therapy:  Group Therapy  Participation Level:  Did Not Attend    Glennon MacLaws, Taelyn Broecker P, MSW, LCSW 03/20/2016, 5:13 PM

## 2016-03-20 NOTE — Progress Notes (Signed)
Recreation Therapy Notes  Date: 04.04.17 Time: 3:10 pm Location: Craft Room  Group Topic: Goal Setting  Goal Area(s) Addresses:  Patient will be able to identify one supportive statement per peer in group. Patient will verbalize benefit of setting goals. Patient will verbalize benefit of supporting peers. Patient will identify benefit of feeling supported.  Behavioral Response: Did not attend  Intervention: Step By Step  Activity: Patients were given a foot worksheet and instructed to write a goal towards their recovery inside the foot. Patients then passed their papers around and wrote supportive statements on their peer's papers.  Education: LRT educated patients on healthy ways to celebrate achieving their goals.  Education Outcome: Patient did not attend group.  Clinical Observations/Feedback: Patient did not attend group.  Jacquelynn CreeGreene,Seabron Iannello M, LRT/CTRS 03/20/2016 4:32 PM

## 2016-03-20 NOTE — Progress Notes (Signed)
D: Patient denies SI/HI/AVH.  Patient affect flat but his mood is pleasant.  Patient did not attend evening group. Patient remained in his room throughout the shift. No distress noted. A: Support and encouragement offered. Scheduled medications given to pt. Q 15 min checks continued for patient safety. R: Patient receptive. Patient remains safe on the unit.

## 2016-03-20 NOTE — Progress Notes (Signed)
[facility] MD Progress Note  03/20/2016 1:43 PM Antonino Nienhuis Kervin Bones  MRN:  [id number]  Subjective:  Mr. [name] is still psychotic. He continues to have conversations with God and is on a mission to save [place] all. His thinking however is much better. Today he is able to hold a sensible conversation to discuss his medications and follow up. I offered the injectable Gean Birchwood to assure compliance and to lower the cost as he has no health insurance. Today for the first time he is out of bed and out of his room. His hygiene is adequate. He does not interact with peers yet. He agreed to take [place] injection and ask about side effects. Second injection will be on Saturday and is stable she can be discharged then.  Principal Problem: Bipolar I disorder, single manic episode, severe, with psychosis (HCC) Diagnosis:   Patient Active Problem List   Diagnosis Date Noted  . Tobacco use disorder [F17.200] 03/19/2016  . Cannabis use disorder, mild, abuse [F12.10] 03/18/2016  . Bipolar I disorder, single manic episode, severe, with psychosis (HCC) [F30.2] 03/16/2016   Total Time spent with patient: 20 minutes  Past Psychiatric History: New-onset psychosis.  Past Medical History: History reviewed. No pertinent past medical history. History reviewed. No pertinent past surgical history. Family History:  Family History  Problem Relation Age of Onset  . Hyperlipidemia Father   . Diabetes Maternal Grandmother   . Alcohol abuse Paternal Grandfather    Family Psychiatric  History: Father with bipolar. Social History:  History  Alcohol Use  . 0.0 oz/week  . 0 Standard drinks or equivalent per week    Comment: Beer and Tequila on weekends.  3 beers and 4 shots of Tequila over 2 weekends per month     History  Drug Use  . Yes    Comment: Marijuana:  starts and stops.  When smoking, daily.  Tested every 2 weeks due to taking prescription drug abuse--stopped in [place].  Goes  throught 05/2016. Has used MJ since 6th grade.  Went to [employer] a lot daily.    Social History   Social History  . Marital Status: Single    Spouse Name: N/A  . Number of Children: 0  . 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 Smoking status: Light Tobacco Smoker -- 0.00 packs/day    Types: Cigarettes  . Smokeless tobacco: Never Used     Comment: Smokes 1 pack every few months  . Alcohol Use: 0.0 oz/week    0 Standard drinks or equivalent per week     Comment: Beer and Tequila on weekends.  3 beers and 4 shots of Tequila over 2 weekends per month  . Drug Use: Yes     Comment: Marijuana:  starts and stops.  When smoking, daily.  Tested every 2 weeks due to taking prescription drug abuse--stopped in [place].  Goes throught 05/2016. Has used MJ since 6th grade.  Went to [employer] a lot daily.  Marland Kitchen Sexual Activity: Yes    Birth Control/ Protection: Condom   Other Topics Concern  . None   Social History Narrative   Originally from [place]   Came to [employer]. When 20 years old.     Lives with parents and 110 yo sister.    Additional Social History:  Sleep: Fair  Appetite:  Good  Current Medications: Current Facility-Administered Medications  Medication Dose Route Frequency Provider Last Rate Last Dose  . acetaminophen (TYLENOL) tablet 650 mg  650 mg Oral Q6H PRN Darliss RidgelAarti K Kapur, MD      . alum & mag hydroxide-simeth (MAALOX/MYLANTA) 200-200-20 MG/5ML suspension 30 mL  30 mL Oral Q4H PRN Darliss RidgelAarti K Kapur, MD      . citalopram (CELEXA) tablet 20 mg  20 mg Oral Daily Darliss RidgelAarti K Kapur, MD   20 mg at 03/20/16 0852  . magnesium hydroxide (MILK OF MAGNESIA) suspension 30 mL  30 mL Oral Daily PRN Darliss RidgelAarti K Kapur, MD      . paliperidone (INVEGA SUSTENNA) injection 234 mg  234 mg Intramuscular Q28 days Nicholis Stepanek B Jakylan Ron, MD      . risperiDONE (RISPERDAL) tablet 3 mg  3 mg  Oral BID Shari ProwsJolanta B Yvonnie Schinke, MD   3 mg at 03/20/16 [id number]    Lab Results: No results found for this or any previous visit (from the past 48 hour(s)).  Blood Alcohol level:  Lab Results  Component Value Date   ETH <5 03/15/2016    Physical Findings: AIMS: Facial and Oral Movements Muscles of Facial Expression: None, normal Lips and Perioral Area: None, normal Jaw: None, normal Tongue: None, normal,Extremity Movements Upper (arms, wrists, hands, fingers): None, normal Lower (legs, knees, ankles, toes): None, normal, Trunk Movements Neck, shoulders, hips: None, normal, Overall Severity Severity of abnormal movements (highest score from questions above): None, normal Incapacitation due to abnormal movements: None, normal Patient's awareness of abnormal movements (rate only patient's report): No Awareness, Dental Status Current problems with teeth and/or dentures?: No Does patient usually wear dentures?: No  CIWA:  CIWA-Ar Total: 0 COWS:  COWS Total Score: 0  Musculoskeletal: Strength & Muscle Tone: within normal limits Gait & Station: normal Patient leans: N/A  Psychiatric Specialty Exam: Review of Systems  Psychiatric/Behavioral: Positive for hallucinations.  All other systems reviewed and are negative.   Blood pressure 107/57, pulse 68, temperature 97.8 F (36.6 C), temperature source Oral, resp. rate 20, height 5\' 8"  (1.727 m), weight 63.504 kg (140 lb).Body mass index is 21.29 kg/(m^2).  General Appearance: Casual  Eye Contact::  Fair  Speech:  Clear and Coherent  Volume:  Normal  Mood:  Euphoric  Affect:  Appropriate  Thought Process:  Disorganized  Orientation:  Full (Time, Place, and Person)  Thought Content:  Delusions, Hallucinations: Auditory and Paranoid Ideation  Suicidal Thoughts:  No  Homicidal Thoughts:  No  Memory:  Immediate;   Fair Recent;   Fair Remote;   Fair  Judgement:  Poor  Insight:  Shallow  Psychomotor Activity:  Normal  Concentration:   Fair  Recall:  [employer]  Fund of Knowledge:Fair  Language: Fair  Akathisia:  No  Handed:  Right  AIMS (if indicated):     Assets:  Communication Skills Desire for Improvement Housing Physical Health Resilience Social Support Vocational/Educational  ADL's:  Intact  Cognition: WNL  Sleep:  Number of Hours: 6.75   Treatment Plan Summary: Daily contact with patient to assess and evaluate symptoms and progress in treatment and Medication management   Mr. Pasty ArchHernandez Avalos is a 20 year old single Hispanic male with no significant past psychiatric history who was brought to the emergency room [facility] symptoms. Toxicology screen was negative in the emergency room. The patient has never been hospitalized before and has no past psychiatric history.  1. Psychosis.  The patient was started on Risperdal. We will give Invega sustenna 234 mg today to improve compliance. Next dose on 03/23/2016. We continue oral Risperdal.   2. Metabolic syndrome screening. Total cholesterol and Hemoglobin A1c are normal. Prolactin 34.6.   3. Substance abuse. Toxicology screen was negative in the emergency room and the patient was not intoxicated at the time of admission. The patient was advised to abstain from alcohol and all illicit drugs as they may worsen psychosis and mood symptoms. We'll need to address outpatient substance abuse treatment at the time of discharge. He is currently being drug tested on a regular basis for probation.  4. Disposition. He will be discharged to home with his parents. He will follow up with [facility].   Kristine Linea, MD 03/20/2016, 1:43 PM
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
## 2016-03-20 NOTE — BHH Suicide Risk Assessment (Signed)
B[facility] INPATIENT:  Family/Significant Other Suicide Prevention Education  Suicide Prevention Education:  Patient Refusal for Family/Significant Other Suicide Prevention Education: The patient Todd [name] has refused to provide written consent for family/significant other to be provided Family/Significant Other Suicide Prevention Education during admission and/or prior to discharge.  Physician notified.  Sallee LangeCunningham, Miroslav Gin C 03/20/2016, 3:22 PM

## 2016-03-20 NOTE — Progress Notes (Signed)
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 - sometimes doens't want to go to work, wants to do something he loves, dad's behavior sometimes isn't good; doesn't know what to do with life)  Coping Skills:   Isolate, Substance Abuse, Avoidance, Exercise, Art/Dance, Talking, Music, Sports  Personal Challenges: Communication, Concentration, Decision-Making, Expressing Yourself, Problem-Solving, Relationships, Self-Esteem/Confidence, Stress Management, Time Management, Trusting Others, Work Nutritional therapisterformance  Leisure Interests (2+):  Music - Listen, Individual - Other (Comment) (Have a good talk or laugh, play soccer)  Awareness of Community Resources:  Yes  Community Resources:  Gym, Other (Comment) Therapist, music(Recreation Center)  Current Use: No  If no, Barriers?: Other (Comment) (Time)  Patient Strengths:  Smile, personality  Patient Identified Areas of Improvement:  Personality  Current Recreation Participation:  Playing soccer  Patient Goal for Hospitalization:  To get himself together  [place]  [place]idence[place]   Current [place] (including self-harm):  No  Current HI:  No  Consent to Intern Participation: N/A   Jacquelynn CreeGreene,Rosali Augello M, LRT/CTRS 03/20/2016, 1:44 PM

## 2016-03-21 MED ORDER — PSEUDOEPHEDRINE HCL 30 MG PO TABS: 30.0000 mg | ORAL_TABLET | Freq: Two times a day (BID) | ORAL | Status: DC

## 2016-03-21 MED ORDER — FLUTICASONE PROPIONATE 50 MCG/ACT NA SUSP
2.0000 | Freq: Two times a day (BID) | NASAL | Status: DC
  Administered 2016-03-21 – 2016-03-24 (×7): 2 via NASAL
  Filled 2016-03-21: qty 16

## 2016-03-21 MED ORDER — PSEUDOEPHEDRINE HCL 30 MG PO TABS
30.0000 mg | ORAL_TABLET | Freq: Two times a day (BID) | ORAL | Status: DC
  Administered 2016-03-21 – 2016-03-24 (×6): 30 mg via ORAL
  Filled 2016-03-21 (×8): qty 1

## 2016-03-21 NOTE — Progress Notes (Signed)
[name] MD Progress Note  03/21/2016 1:40 PM Todd [name]  MRN:  [id number]  Subjective:  [name] is still hallucinate and the believes has special powers but is able to discuss it with some insight. He is sedated from morning Risperdal today. I will discontinue morning dose and continue afternoon dose. He is awaiting second injection of [place] on Saturday. He is mostly in his room. He does not interact with his peers. He does not participate in programming much.  Principal Problem: Bipolar I disorder, single manic episode, severe, with psychosis (HCC) Diagnosis:   Patient Active Problem List   Diagnosis Date Noted  . Tobacco use disorder [F17.200] 03/19/2016  . Cannabis use disorder, mild, abuse [F12.10] 03/18/2016  . Bipolar I disorder, single manic episode, severe, with psychosis (HCC) [F30.2] 03/16/2016   Total Time spent with patient: 20 minutes  Past Psychiatric History: Present episode psychosis.  Past Medical History: History reviewed. No pertinent past medical history. History reviewed. No pertinent past surgical history. Family History:  Family History  Problem Relation Age of Onset  . Hyperlipidemia Father   . Diabetes Maternal Grandmother   . Alcohol abuse Paternal Grandfather    Family Psychiatric  History: Father with bipolar. Social History:  History  Alcohol Use  . 0.0 oz/week  . 0 Standard drinks or equivalent per week    Comment: Beer and Tequila on weekends.  3 beers and 4 shots of Tequila over 2 weekends per month     History  Drug Use  . Yes    Comment: Marijuana:  starts and stops.  When smoking, daily.  Tested every 2 weeks due to taking prescription drug abuse--stopped in [place].  Goes throught 05/2016. Has used MJ since 6th grade.  Went to [employer] a lot daily.    Social History   Social History  . Marital Status: Single    Spouse Name: N/A  . Number of Children: 0  . 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 Smoking status: Light Tobacco Smoker -- 0.00 packs/day    Types: Cigarettes  . Smokeless tobacco: Never Used     Comment: Smokes 1 pack every few months  . Alcohol Use: 0.0 oz/week    0 Standard drinks or equivalent per week     Comment: Beer and Tequila on weekends.  3 beers and 4 shots of Tequila over 2 weekends per month  . Drug Use: Yes     Comment: Marijuana:  starts and stops.  When smoking, daily.  Tested every 2 weeks due to taking prescription drug abuse--stopped in [place].  Goes throught 05/2016. Has used MJ since 6th grade.  Went to [employer] a lot daily.  Marland Kitchen Sexual Activity: Yes    Birth Control/ Protection: Condom   Other Topics Concern  . None   Social History Narrative   Originally from [place]   Came to [employer]. When 20 years old.     Lives with parents and 90 yo sister.    Additional Social History:                         Sleep: Fair  Appetite:  Fair  Current Medications: Current Facility-Administered Medications  Medication Dose Route Frequency Provider Last Rate Last Dose  . acetaminophen (TYLENOL) tablet 650 mg  650 mg Oral Q6H PRN Darliss Ridgel,  MD      . alum & mag hydroxide-simeth (MAALOX/MYLANTA) 200-200-20 MG/5ML suspension 30 mL  30 mL Oral Q4H PRN Darliss RidgelAarti K Kapur, MD      . citalopram (CELEXA) tablet 20 mg  20 mg Oral Daily Darliss RidgelAarti K Kapur, MD   20 mg at 03/21/16 0936  . magnesium hydroxide (MILK OF MAGNESIA) suspension 30 mL  30 mL Oral Daily PRN Darliss RidgelAarti K Kapur, MD      . Melene Muller[START ON 03/24/2016] paliperidone (INVEGA SUSTENNA) injection 156 mg  156 mg Intramuscular Once Jaskirat Zertuche B Miray Mancino, MD      . paliperidone (INVEGA SUSTENNA) injection 234 mg  234 mg Intramuscular Q28 days Shari ProwsJolanta B Jevon Shells, MD   234 mg at 03/20/16 1659  . risperiDONE (RISPERDAL) tablet 3 mg  3 mg Oral BID Shari ProwsJolanta B Keano Guggenheim, MD   3 mg at 03/21/16 [id number]    Lab Results: No  results found for this or any previous visit (from the past 48 hour(s)).  Blood Alcohol level:  Lab Results  Component Value Date   ETH <5 03/15/2016    Physical Findings: AIMS: Facial and Oral Movements Muscles of Facial Expression: None, normal Lips and Perioral Area: None, normal Jaw: None, normal Tongue: None, normal,Extremity Movements Upper (arms, wrists, hands, fingers): None, normal Lower (legs, knees, ankles, toes): None, normal, Trunk Movements Neck, shoulders, hips: None, normal, Overall Severity Severity of abnormal movements (highest score from questions above): None, normal Incapacitation due to abnormal movements: None, normal Patient's awareness of abnormal movements (rate only patient's report): No Awareness, Dental Status Current problems with teeth and/or dentures?: No Does patient usually wear dentures?: No  CIWA:  CIWA-Ar Total: 0 COWS:  COWS Total Score: 0  Musculoskeletal: Strength & Muscle Tone: within normal limits Gait & Station: normal Patient leans: N/A  Psychiatric Specialty Exam: Review of Systems  Psychiatric/Behavioral: Positive for hallucinations.  All other systems reviewed and are negative.   Blood pressure 110/64, pulse 83, temperature 98.2 F (36.8 C), temperature source Oral, resp. rate 20, height 5\' 8"  (1.727 m), weight 63.504 kg (140 lb).Body mass index is 21.29 kg/(m^2).  General Appearance: Casual  Eye Contact::  Minimal  Speech:  Slow  Volume:  Decreased  Mood:  Dysphoric  Affect:  Blunt  Thought Process:  Disorganized  Orientation:  Full (Time, Place, and Person)  Thought Content:  Delusions, Hallucinations: Auditory and Paranoid Ideation  Suicidal Thoughts:  No  Homicidal Thoughts:  No  Memory:  Immediate;   Fair Recent;   Fair Remote;   Fair  Judgement:  Poor  Insight:  Lacking  Psychomotor Activity:  Decreased  Concentration:  Fair  Recall:  Fis[employer]Fair  Fund of Knowledge:Fair  Language: Fair  Akathisia:  No  Handed:   Right  AIMS (if indicated):     Assets:  Communication Skills Desire for Improvement Housing Physical Health Resilience Social Support  ADL's:  Intact  Cognition: WNL  Sleep:  Number of Hours: 7.5   Treatment Plan Summary: Daily contact with patient to assess and evaluate symptoms and progress in treatment and Medication management   Mr. Pasty ArchHernandez Avalos is a 20 year old single Hispanic male with no significant past psychiatric history who was brought to the emergency room after being seen [name] with active psychotic symptoms. Toxicology screen was negative in the emergency room. The patient has never been hospitalized before and has no past psychiatric history.  1. Psychosis. The patient was started on Risperdal. We will give Invega sustenna 234 mg today  to improve compliance. Next dose on 03/23/2016. We discontinue morning Risperdal and continue 3 mg at bedtime.   2. Metabolic syndrome screening. Total cholesterol and Hemoglobin A1c are normal. Prolactin 34.6.   3. Substance abuse. Toxicology screen was negative in the emergency room and the patient was not intoxicated at the time of admission. The patient was advised to abstain from alcohol and all illicit drugs as they may worsen psychosis and mood symptoms. We'll need to address outpatient substance abuse treatment at the time of discharge. He is currently being drug tested on a regular basis for probation.  4. Disposition. He will be discharged to home with his parents. He will follow up with [facility].  Kristine Linea, MD 03/21/2016, 1:40 PM

## 2016-03-21 NOTE — Plan of Care (Signed)
Problem: Ineffective individual coping Goal: LTG: Patient will report a decrease in negative feelings Outcome: Progressing Appropriate behavior  On unit

## 2016-03-21 NOTE — Progress Notes (Signed)
D: Observed pt in room. Patient alert and oriented x4. Patient denies SI/HI. Pt states that he only sees god now "when he wants." Pt went on to vaguely describe that he has visions of god "when I picture it" but not at other times. Pt stated auditory hallucinations "are going away" and have not been present this evening. Pt affect is appropriate with Clinical research associatewriter. Pt joked appropriately with Clinical research associatewriter. Pt is mostly isolative to room and does not socialize with peers. When asked, pt stated "Just how I am." Pt talked with writer more than previous nights, and elaborated more than previous yes/no answers. Pt stated his mood is "good..I feel more alive." Pt rated depression 4/10 and anxiety 4/10 due to "missing my mom, dad, and work and being bored." Pt had no other complaints other than desire to leave. A: Offered active listening and support. Provided therapeutic communication. Administered scheduled medications. Encouraged pt to try to attend 3 groups tomorrow. R: Pt stated "I'll try" in response to going to groups. Pt pleasant and cooperative. Pt medication compliant. Will continue Q15 min. checks. Safety maintained.

## 2016-03-21 NOTE — [facility] Group Notes (Signed)
[facility] LCSW Group Therapy  03/21/2016 2:46 PM  Type of Therapy:  Group Therapy  Participation Level:  Did Not Attend  Modes of Intervention:  Activity, Discussion, Education, Socialization and Support  Summary of Progress/Problems: Emotional Regulation: Patients will identify both negative and positive emotions. They will discuss emotions they have difficulty regulating and how they impact their lives. Patients will be asked to identify healthy coping skills to combat unhealthy reactions to negative emotions.     Todd [name] MSW, LCSWA  03/21/2016, 2:46 PM

## 2016-03-21 NOTE — [facility] Group Notes (Signed)
[facility] Group Notes:  (Nursing/MHT/Case Management/Adjunct)  Date:  03/21/2016  Time:  6:23 AM  Type of Therapy:  Group Therapy  Participation Level:  Did Not Attend    Summary of Progress/Problems:  Todd [name] 03/21/2016, 6:23 AM

## 2016-03-21 NOTE — Plan of Care (Signed)
Problem: Alteration in thought process Goal: LTG-Patient has not harmed self or others in at least 2 days Outcome: Progressing Patient remains appropriate  Behavior

## 2016-03-21 NOTE — [facility] Group Notes (Signed)
[facility] LCSW Aftercare Discharge Planning Group Note   03/21/2016 11:18 AM  Participation Quality: Did not attend.   Todd [name] L Marcus Schwandt MSW, LCSWA    

## 2016-03-21 NOTE — Progress Notes (Signed)
Recreation Therapy Notes  Date: 04.05.17 Time: 3:00 pm Location: Craft Room  Group Topic: Self-esteem  Goal Area(s) Addresses:  Patient will be able to identify benefit of self-esteem. Patient will be able to identify ways to increase self-esteem.  Behavioral Response: Attentive, Interactive  Intervention: Self-Portrait  Activity: Patients were given a blank face worksheet and instructed to draw their self-portrait of how they were feeling. Patients got a different worksheet and were instructed to write their first name and something positive about themselves. Patients passed their worksheets around and wrote positive traits about peers. Patient drew their self-portrait of how they felt after they read the positive comments from peers.  Education: LRT educated patients on ways to increase their self-esteem.  Education Outcome: Acknowledges education/In group clarification offered   Clinical Observations/Feedback: Patient completed activity by drawing both self-portraits, and writing positive trait about self and peers. Patient contributed to group discussion by stating ways he can increase his self-esteem.  Jacquelynn CreeGreene,Evi Mccomb M, LRT/CTRS 03/21/2016 2:33 PM

## 2016-03-21 NOTE — Tx Team (Addendum)
Interdisciplinary Treatment Plan Update (Adult)  Date:  03/21/2016 Time Reviewed:  4:11 PM  Progress in Treatment: Attending groups: No. Participating in groups:  No. Taking medication as prescribed:  Yes. Tolerating medication:  Yes. Family/Significant othe contact made:  No, will contact:  if patient provides consent. Patient understands diagnosis:  Yes. Discussing patient identified problems/goals with staff:  Yes. Medical problems stabilized or resolved:  Yes. Denies suicidal/homicidal ideation: Yes. Issues/concerns per patient self-inventory:  Yes. Other:  New problem(s) identified: No, Describe:  none reported  Discharge Plan or Barriers: Patient will stabilize on medication and discharge home with outpatient mental health follow up.   Reason for Continuation of Hospitalization: Hallucinations Mania Medication stabilization  Comments: Patient is started on Abilify and will receive his second injection Saturday 03/24/16 and discharge  Estimated length of stay: up to 4 days, expected discharge Saturday 03/24/16  New goal(s):  Review of initial/current patient goals per problem list:   1.  Goal(s): Participate in aftercare plan   Met:  Yes  Target date: by discharge  As evidenced by: patient will participate in aftercare plan AEB aftercare provider and housing plan identified at discharge 03/20/16: Patient can return home once stabilized on meds and will follow up at [facility]. Goal met.   2.  Goal (s): Decrease psychosis    Met:  No  Target date: by discharge  As evidenced by: patient demonstrates decreased symptoms of psychosis 03/20/16: Patient starting meds and will continue to be monitored.  3.  Goal(s): Decrease mania   Met:  No  Target date: by discharge  As evidenced by: patient demonstrates decreased symptoms of mania 03/20/16: Patient starting Abilify and continues to be monitored for symptoms of mania    Attendees: Physician:  Orson Slick, MD  4/5/20174:11 PM  Nursing:   Polly Cobia, RN 4/5/20174:11 PM  Other:  Carmell Austria, LCSW 4/5/20174:11 PM  Other:  Garald Braver, Psych D 4/5/20174:11 PM  Other:  Everitt Amber, Max 4/5/20174:11 PM  Other:  4/5/20174:11 PM  Other:  4/5/20174:11 PM  Other:  4/5/20174:11 PM  Other:  4/5/20174:11 PM  Other:  4/5/20174:11 PM  Other:  4/5/20174:11 PM  Other:   4/5/20174:11 PM   Scribe for Treatment Team:   Keene Breath, MSW, LCSW  03/21/2016, 4:11 PM

## 2016-03-21 NOTE — Plan of Care (Signed)
Problem: [facility] Participation in Recreation Therapeutic Interventions Goal: STG-Patient will demonstrate improved self esteem by identif STG: Self-Esteem - Within 4 treatment sessions, patient will verbalize at least 5 positive affirmation statements in each of 2 treatment sessions to increase self-esteem post d/c.  Outcome: Progressing Treatment Session 1; Completed 1 out of 2: At approximately 2:20 pm, LRT met with patient in patient room. Patient verbalized 5 positive affirmation statements. Patient reported it felt "good". LRT encouraged patient to continue saying positive affirmation statements. Intervention Used: I Am statements  Leonette [facility], LRT/CTRS 04.05.17 2:49 pm Goal: STG-Other Recreation Therapy Goal (Specify) STG: Stress Management - Within 4 treatment sessions, patient will verbalize understanding of the stress management techniques in each of 2 treatment sessions to increase stress management skills post d/c.  Outcome: Progressing Treatment Session 1; Completed 1 out of 2: At approximately 2:20 pm, LRT met with patient in patient room. LRT educated and provided patient with handouts on stress management techniques. Patient verbalized understanding. LRT encouraged patient to read over and practice the stress management techniques. Intervention Used: Stress Management handouts  Leonette [facility], LRT/CTRS 04.05.17 2:50 pm

## 2016-03-21 NOTE — Plan of Care (Signed)
Problem: Alteration in thought process Goal: LTG-Patient behavior demonstrates decreased signs psychosis (Patient behavior demonstrates decreased signs of psychosis to the point the patient is safe to return home and continue treatment in an outpatient setting.)  Outcome: Progressing Pt stated that he hasn't had any auditory hallucinations, but sometimes see's "god...when I want to."

## 2016-03-21 NOTE — Progress Notes (Signed)
D: Continue to voice of wanting to go home . Limited interaction with his peers and staff. Affect cheerful on approach Appetite good and voice no concerns around  Sleep    Denies Depression  And auditory  Hallucinations. No Religious preoccupation noted A: Encourage patient participation with unit programming . Instruction  Given on  Medication , verbalize understanding. R: Voice no other concerns. Staff continue to monitor

## 2016-03-21 NOTE — [facility] Group Notes (Signed)
[facility] Group Notes:  (Nursing/MHT/Case Management/Adjunct)  Date:  03/21/2016  Time:  4:02 PM  Type of Therapy:  Psychoeducational Skills  Participation Level:  Active  Participation Quality:  Appropriate  Affect:  Appropriate  Cognitive:  Appropriate  Insight:  Appropriate  Engagement in Group:  Engaged and Improving  Modes of Intervention:  Discussion and Education  Summary of Progress/Problems:  Todd [name] M Todd [name] 03/21/2016, 4:02 PM

## 2016-03-22 MED ORDER — RISPERIDONE 3 MG PO TABS
3.0000 mg | ORAL_TABLET | Freq: Every day | ORAL | Status: DC
  Administered 2016-03-23: 3 mg via ORAL
  Filled 2016-03-22: qty 1

## 2016-03-22 MED ORDER — DIPHENHYDRAMINE HCL 25 MG PO CAPS
50.0000 mg | ORAL_CAPSULE | Freq: Once | ORAL | Status: AC
Start: 2016-03-22 — End: 2016-03-22
  Administered 2016-03-22: 50 mg via ORAL
  Filled 2016-03-22: qty 2

## 2016-03-22 NOTE — Tx Team (Signed)
Interdisciplinary Treatment Plan Update (Adult)  Date:  03/22/2016 Time Reviewed:  3:37 PM  Progress in Treatment: Attending groups: Yes. Participating in groups:  Yes. Taking medication as prescribed:  Yes. Tolerating medication:  Yes. Family/Significant othe contact made:  No, will contact:  if patient provides consent. Patient understands diagnosis:  Yes. Discussing patient identified problems/goals with staff:  Yes. Medical problems stabilized or resolved:  Yes. Denies suicidal/homicidal ideation: Yes. Issues/concerns per patient self-inventory:  Yes. Other:  New problem(s) identified: No, Describe:  none reported  Discharge Plan or Barriers: Patient will stabilize on medication and discharge home with outpatient mental health follow up.   Reason for Continuation of Hospitalization: Hallucinations Mania Medication stabilization  Comments: Patient is started on Abilify and will receive his second injection Saturday 03/24/16 and discharge  Estimated length of stay: up to 2 days, expected discharge Saturday 03/24/16  New goal(s):  Review of initial/current patient goals per problem list:   1.  Goal(s): Participate in aftercare plan   Met:  Yes  Target date: by discharge  As evidenced by: patient will participate in aftercare plan AEB aftercare provider and housing plan identified at discharge 03/20/16: Patient can return home once stabilized on meds and will follow up at [facility]. Goal met.   2.  Goal (s): Decrease psychosis    Met:  No  Target date: by discharge  As evidenced by: patient demonstrates decreased symptoms of psychosis 03/20/16: Patient starting meds and will continue to be monitored. 03/22/16: Patient plans to receive second injectable on Saturday and discharge home.   3.  Goal(s): Decrease mania   Met:  No  Target date: by discharge  As evidenced by: patient demonstrates decreased symptoms of mania 03/20/16: Patient starting Abilify and continues to be  monitored for symptoms of mania 03/22/16: Patient reports he is feeling better and agreeable to injectable medication with 2nd injection on Saturday and possible discharge.    Attendees: Physician:  Orson Slick, MD 4/6/20173:37 PM  Nursing:   Elige Radon, RN 4/6/20173:37 PM  Other:  Carmell Austria, LCSW 4/6/20173:37 PM  Other:  Abran Cantor, RN  4/6/20173:37 PM  Other:  Everitt Amber, Nichols 4/6/20173:37 PM  Patient: Todd [name] 4/6/20173:37 PM  Other:  4/6/20173:37 PM  Other:  4/6/20173:37 PM  Other:  4/6/20173:37 PM  Other:  4/6/20173:37 PM  Other:  4/6/20173:37 PM  Other:   4/6/20173:37 PM   Scribe for Treatment Team:   Keene Breath, MSW, LCSW  03/22/2016, 3:37 PM

## 2016-03-22 NOTE — Plan of Care (Signed)
Problem: Alteration in thought process Goal: STG-Patient is able to discuss thoughts with staff Outcome: Progressing Encourage participation  With unit programing

## 2016-03-22 NOTE — Progress Notes (Signed)
D: Pt denies SI/HI/AVH. Pt is pleasant and cooperative affect is bright. Pt appears less anxious and he is interacting with peers and staff appropriately.  A: Pt was offered support and encouragement. Pt was given scheduled medications. Pt was encouraged to attend groups. Q 15 minute checks were done for safety.  R:Pt attends groups and interacts well with peers and staff. Pt is taking medication. Pt has no complaints.Pt receptive to treatment and safety maintained on unit.

## 2016-03-22 NOTE — Progress Notes (Signed)
Recreation Therapy Notes  Date: 04.06.17 Time: 1:00 pm Location: Craft Room  Group Topic: Leisure Education  Goal Area(s) Addresses:  Patient will identify things they are grateful for. Patient will identify how being grateful can influence decision making.  Behavioral Response: Attentive, Interactive  Intervention: Grateful Wheel  Activity: Patients were given an I Am Grateful For worksheet and instructed to write 2-3 things they are grateful for under each category.  Education:LRT educated patients on why it is important to think about things they are grateful for.  Education Outcome: In group clarification offered  Clinical Observations/Feedback: Patient completed activity by writing things he was grateful for. Patient contributed to group discussion by stating things he was grateful for.  Jacquelynn CreeGreene,Lonita Debes M, LRT/CTRS 03/22/2016 2:31 PM

## 2016-03-22 NOTE — [facility] Group Notes (Signed)
[facility] Group Notes:  (Nursing/MHT/Case Management/Adjunct)  Date:  03/22/2016  Time:  10:13 PM  Type of Therapy:  Evening Wrap-up Group  Participation Level:  Active  Participation Quality:  Appropriate and Attentive  Affect:  Appropriate  Cognitive:  Alert and Appropriate  Insight:  Appropriate, Good and Improving  Engagement in Group:  Developing/Improving, Engaged and Improving  Modes of Intervention:  Discussion  Summary of Progress/Problems: Patient appeared very bright in group, states he met his goal to attend all groups today and is looking forward to discharge.  Josue Kass Nanta Annalysia Willenbring 03/22/2016, 10:13 PM

## 2016-03-22 NOTE — [facility] Group Notes (Signed)
[facility] Group Notes:  (Nursing/MHT/Case Management/Adjunct)  Date:  03/22/2016  Time:  4:54 AM  Type of Therapy:  Group Therapy  Participation Level:  Did Not Attend   Summary of Progress/Problems:  Todd [name] 03/22/2016, 4:54 AM

## 2016-03-22 NOTE — [facility] Group Notes (Signed)
[facility] Group Notes:  (Nursing/MHT/Case Management/Adjunct)  Date:  03/22/2016  Time:  3:50 PM  Type of Therapy:  Movement Therapy  Participation Level:  Did Not Attend  Arnez Stoneking De'Chelle Paisyn Guercio 03/22/2016, 3:50 PM

## 2016-03-22 NOTE — Progress Notes (Signed)
[facility] MD Progress Note  03/22/2016 2:19 PM Todd [name]  MRN:  [id number]  Subjective:  Todd [name] reports further improvement. Hallucinations almost disappeared. He is no longer preoccupied with religious themes or grandiose plans. He sleeps and eats well. She accepts medications and tolerates them well. There are no somatic complaints. His family visited yesterday again and they are very happy with his progress. At this point the patient is awaiting second injection of [place] on Saturday. The patient participated in treatment team meeting today. He was able to discuss discharge planning and follow-up treatment. He has been participating in groups now.  Principal Problem: Bipolar I disorder, single manic episode, severe, with psychosis (HCC) Diagnosis:   Patient Active Problem List   Diagnosis Date Noted  . Tobacco use disorder [F17.200] 03/19/2016  . Cannabis use disorder, mild, abuse [F12.10] 03/18/2016  . Bipolar I disorder, single manic episode, severe, with psychosis (HCC) [F30.2] 03/16/2016   Total Time spent with patient: 20 minutes  Past Psychiatric History: New onset psychosis.  Past Medical History: History reviewed. No pertinent past medical history. History reviewed. No pertinent past surgical history. Family History:  Family History  Problem Relation Age of Onset  . Hyperlipidemia Father   . Diabetes Maternal Grandmother   . Alcohol abuse Paternal Grandfather    Family Psychiatric  History: Father with bipolar. Social History:  History  Alcohol Use  . 0.0 oz/week  . 0 Standard drinks or equivalent per week    Comment: Beer and Tequila on weekends.  3 beers and 4 shots of Tequila over 2 weekends per month     History  Drug Use  . Yes    Comment: Marijuana:  starts and stops.  When smoking, daily.  Tested every 2 weeks due to taking prescription drug abuse--stopped in [place].  Goes throught 05/2016. Has used MJ since 6th grade.  Went  to [employer] a lot daily.    Social History   Social History  . Marital Status: Single    Spouse Name: N/A  . Number of Children: 0  . 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 Smoking status: Light Tobacco Smoker -- 0.00 packs/day    Types: Cigarettes  . Smokeless tobacco: Never Used     Comment: Smokes 1 pack every few months  . Alcohol Use: 0.0 oz/week    0 Standard drinks or equivalent per week     Comment: Beer and Tequila on weekends.  3 beers and 4 shots of Tequila over 2 weekends per month  . Drug Use: Yes     Comment: Marijuana:  starts and stops.  When smoking, daily.  Tested every 2 weeks due to taking prescription drug abuse--stopped in [place].  Goes throught 05/2016. Has used MJ since 6th grade.  Went to [employer] a lot daily.  Marland Kitchen Sexual Activity: Yes    Birth Control/ Protection: Condom   Other Topics Concern  . None   Social History Narrative   Originally from [place]   Came to [employer]. When 20 years old.     Lives with parents and 71 yo sister.    Additional Social History:                         Sleep: Fair  Appetite:  Good  Current Medications: Current Facility-Administered Medications  Medication Dose Route  Frequency Provider Last Rate Last Dose  . acetaminophen (TYLENOL) tablet 650 mg  650 mg Oral Q6H PRN Darliss RidgelAarti K Kapur, MD      . alum & mag hydroxide-simeth (MAALOX/MYLANTA) 200-200-20 MG/5ML suspension 30 mL  30 mL Oral Q4H PRN Darliss RidgelAarti K Kapur, MD      . citalopram (CELEXA) tablet 20 mg  20 mg Oral Daily Darliss RidgelAarti K Kapur, MD   20 mg at 03/22/16 0846  . fluticasone (FLONASE) 50 MCG/ACT nasal spray 2 spray  2 spray Each Nare BID Shari ProwsJolanta B Axtyn Woehler, MD   2 spray at 03/22/16 0846  . magnesium hydroxide (MILK OF MAGNESIA) suspension 30 mL  30 mL Oral Daily PRN Darliss RidgelAarti K Kapur, MD      . Melene Muller[START ON 03/24/2016] paliperidone (INVEGA SUSTENNA) injection 156 mg  156 mg  Intramuscular Once Tahtiana Rozier B Kimberlin Scheel, MD      . paliperidone (INVEGA SUSTENNA) injection 234 mg  234 mg Intramuscular Q28 days Shari ProwsJolanta B Jamye Balicki, MD   234 mg at 03/20/16 1659  . pseudoephedrine (SUDAFED) tablet 30 mg  30 mg Oral BID AC & HS Shawnie Nicole B Marchetta Navratil, MD   30 mg at 03/22/16 0846  . risperiDONE (RISPERDAL) tablet 3 mg  3 mg Oral BID Shari ProwsJolanta B Moses Odoherty, MD   3 mg at 03/22/16 0846    Lab Results: No results found for this or any previous visit (from the past 48 hour(s)).  Blood Alcohol level:  Lab Results  Component Value Date   ETH <5 03/15/2016    Physical Findings: AIMS: Facial and Oral Movements Muscles of Facial Expression: None, normal Lips and Perioral Area: None, normal Jaw: None, normal Tongue: None, normal,Extremity Movements Upper (arms, wrists, hands, fingers): None, normal Lower (legs, knees, ankles, toes): None, normal, Trunk Movements Neck, shoulders, hips: None, normal, Overall Severity Severity of abnormal movements (highest score from questions above): None, normal Incapacitation due to abnormal movements: None, normal Patient's awareness of abnormal movements (rate only patient's report): No Awareness, Dental Status Current problems with teeth and/or dentures?: No Does patient usually wear dentures?: No  CIWA:  CIWA-Ar Total: 0 COWS:  COWS Total Score: 0  Musculoskeletal: Strength & Muscle Tone: within normal limits Gait & Station: normal Patient leans: N/A  Psychiatric Specialty Exam: Review of Systems  Psychiatric/Behavioral: Positive for hallucinations.  All other systems reviewed and are negative.   Blood pressure 116/56, pulse 79, temperature 98.2 F (36.8 C), temperature source Oral, resp. rate 18, height 5\' 8"  (1.727 m), weight 63.504 kg (140 lb).Body mass index is 21.29 kg/(m^2).  General Appearance: Casual  Eye Contact::  Good  Speech:  Clear and Coherent  Volume:  Normal  Mood:  Euphoric  Affect:  Appropriate  Thought  Process:  Goal Directed  Orientation:  Full (Time, Place, and Person)  Thought Content:  Delusions, Hallucinations: Auditory and Paranoid Ideation  Suicidal Thoughts:  No  Homicidal Thoughts:  No  Memory:  Immediate;   Fair Recent;   Fair Remote;   Fair  Judgement:  Poor  Insight:  Shallow  Psychomotor Activity:  Normal  Concentration:  Fair  Recall:  [employer]  Fund of Knowledge:Fair  Language: Fair  Akathisia:  No  Handed:  Right  AIMS (if indicated):     Assets:  Communication Skills Desire for Improvement Housing Physical Health Resilience Social Support Transportation Vocational/Educational  ADL's:  Intact  Cognition: WNL  Sleep:  Number of Hours: 7   Treatment Plan Summary: Daily contact with patient to assess and  evaluate symptoms and progress in treatment and Medication management   Mr. Ricahrd Schwager is a 20 year old single Hispanic male with no significant past psychiatric history who was brought to the emergency room after being seen at [facility] with active psychotic symptoms. Toxicology screen was negative in the emergency room. The patient has never been hospitalized before and has no past psychiatric history.  1. Psychosis. The patient was started on Risperdal. We will give Invega sustenna 234 mg today to improve compliance. Next dose on 03/23/2016. We discontinue morning Risperdal and continue 3 mg at bedtime.   2. Metabolic syndrome screening. Total cholesterol and Hemoglobin A1c are normal. Prolactin 34.6.   3. Substance abuse. Toxicology screen was negative in the emergency room and the patient was not intoxicated at the time of admission. The patient was advised to abstain from alcohol and all illicit drugs as they may worsen psychosis and mood symptoms. We'll need to address outpatient substance abuse treatment at the time of discharge. He is currently being drug tested on a regular basis for probation.  4. Disposition. He will be discharged to home with his  parents. He will follow up with [facility].   Kristine Linea, MD 03/22/2016, 2:19 PM

## 2016-03-22 NOTE — [facility] Group Notes (Signed)
[facility] LCSW Group Therapy  03/22/2016 3:47 PM  Type of Therapy:  Group Therapy  Participation Level:  Minimal  Participation Quality:  Attentive   Affect:  Flat    Cognitive:  Alert   Insight:  Limited   Engagement in Therapy: Limited   Modes of Intervention:  Discussion, Education, Socialization and Support  Summary of Progress/Problems: Balance in life: Patients will discuss the concept of balance and how it looks and feels to be unbalanced. Pt will identify areas in their life that is unbalanced and ways to become more balanced. Pt attended grop and stayed the entire time. Pt sat quietly and listened to other group members share.     Daisy Floroandace L Connie Lasater MSW, LCSWA  03/22/2016, 3:47 PM

## 2016-03-22 NOTE — Plan of Care (Signed)
Problem: [facility] Participation in Recreation Therapeutic Interventions Goal: STG-Patient will demonstrate improved self esteem by identif STG: Self-Esteem - Within 4 treatment sessions, patient will verbalize at least 5 positive affirmation statements in each of 2 treatment sessions to increase self-esteem post d/c.  Outcome: Completed/Met Date Met:  03/22/16 Treatment Session 2; Completed 2 out of 2: At approximately 12:00 pm, LRT met with patient in patient room. Patient verbalized 5 positive affirmation statements. Patient reported it felt "good". LRT encouraged patient to continue saying positive affirmation statements. Intervention Used: I Am statements  Leonette Monarch, LRT/CTRS 04.06.17 12:11 pm Goal: STG-Other Recreation Therapy Goal (Specify) STG: Stress Management - Within 4 treatment sessions, patient will verbalize understanding of the stress management techniques in each of 2 treatment sessions to increase stress management skills post d/c.  Outcome: Completed/Met Date Met:  03/22/16 Treatment Session 2; Completed 2 out of 2: At approximately 12:00 pm, LRT met with patient in patient room. Patient reported he read over and practiced the stress management techniques. Patient verbalized understanding and reported the techniques were helpful. LRT encouraged patient to continue practicing the stress management techniques. Intervention Used: Stress Management handouts  Leonette Monarch, LRT/CTRS 04.06.17 12:13 pm

## 2016-03-22 NOTE — Progress Notes (Signed)
D: Patient stated slept good last night .Stated appetite fair energy level  Is highl. Stated concentration is good . Stated on Depression scale 2 , hopeless 0 and anxiety 1 .( low 0-10 high) Denies suicidal  homicidal ideations  .  No auditory hallucinations  No pain concerns . Appropriate ADL'S. Interacting with peers and staff.  Voice of wanting to be more active and less sleeping in room . Statted  He needed to get out of bed and attend group A: Encourage patient participation with unit programming . Instruction  Given on  Medication , verbalize understanding. R: Voice no other concerns. Staff continue to monitor

## 2016-03-22 NOTE — Plan of Care (Signed)
Problem: Alteration in thought process Goal: LTG-Patient behavior demonstrates decreased signs psychosis (Patient behavior demonstrates decreased signs of psychosis to the point the patient is safe to return home and continue treatment in an outpatient setting.)  Outcome: Progressing Decreased psychosis.      

## 2016-03-23 MED ORDER — RISPERIDONE 3 MG PO TABS: 3.0000 mg | ORAL_TABLET | Freq: Every day | ORAL | Status: DC

## 2016-03-23 MED ORDER — PALIPERIDONE PALMITATE 234 MG/1.5ML IM SUSP: 234.0000 mg | INTRAMUSCULAR | Status: DC

## 2016-03-23 NOTE — Progress Notes (Signed)
[facility]  03/23/2016 11:12 AM Todd FisherJuan Francisco Pasty ArchHernandez [name]  MRN:  [id number]  Subjective:  Todd [name] reports further improvement. Very infrequently he hears voices. He is no longer preoccupied with religion. He started participating in group. He is awaiting second injection of [place] Sustenna tomorrow. Anticipated discharge tomorrow  Principal Problem: Bipolar I disorder, single manic episode, severe, with psychosis (HCC) Diagnosis:   Patient Active Problem List   Diagnosis Date Noted  . Tobacco use disorder [F17.200] 03/19/2016  . Cannabis use disorder, mild, abuse [F12.10] 03/18/2016  . Bipolar I disorder, single manic episode, severe, with psychosis (HCC) [F30.2] 03/16/2016   Total Time spent with patient: 20 minutes  Past Psychiatric History: New onset psychosis.  Past Medical History: History reviewed. No pertinent past medical history. History reviewed. No pertinent past surgical history. Family History:  Family History  Problem Relation Age of Onset  . Hyperlipidemia Father   . Diabetes Maternal Grandmother   . Alcohol abuse Paternal Grandfather    Family Psychiatric  History: Father with bipolar. Social History:  History  Alcohol Use  . 0.0 oz/week  . 0 Standard drinks or equivalent per week    Comment: Beer and Tequila on weekends.  3 beers and 4 shots of Tequila over 2 weekends per month     History  Drug Use  . Yes    Comment: Marijuana:  starts and stops.  When smoking, daily.  Tested every 2 weeks due to taking prescription drug abuse--[place] in [place].  Goes throught 05/2016. Has used MJ since 6th grade.  Went to [employer]--smoked a lot daily.    Social History   Social History  . Marital Status: Single    Spouse Name: N/A  . Number of Children: 0  . 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 Smoking status: Light Tobacco Smoker -- 0.00 packs/day    Types:  Cigarettes  . Smokeless tobacco: Never Used     Comment: Smokes 1 pack every few months  . Alcohol Use: 0.0 oz/week    0 Standard drinks or equivalent per week     Comment: Beer and Tequila on weekends.  3 beers and 4 shots of Tequila over 2 weekends per month  . Drug Use: Yes     Comment: Marijuana:  starts and stops.  When smoking, daily.  Tested every 2 weeks due to taking prescription drug abuse--[place] in [place].  Goes throught 05/2016. Has used MJ since 6th grade.  Went to [employer]--smoked a lot daily.  Marland Kitchen. Sexual Activity: Yes    Birth Control/ Protection: Condom   Other Topics Concern  . None   Social History Narrative   [place]   [place] 20 [place]     Lives with parents and 20 yo sister.    Additional Social History:                         Sleep: Fair  Appetite:  Fair  Current Medications: Current Facility-Administered Medications  Medication Dose Route Frequency Provider Last Rate Last Dose  . acetaminophen (TYLENOL) tablet 650 mg  650 mg Oral Q6H PRN Darliss RidgelAarti K Kapur, MD   650 mg at 03/22/16 2137  . alum & mag hydroxide-simeth (MAALOX/MYLANTA) 200-200-20 MG/5ML suspension 30 mL  30 mL Oral Q4H PRN Darliss RidgelAarti K Kapur, MD      .  citalopram (CELEXA) tablet 20 mg  20 mg Oral Daily Darliss Ridgel, MD   20 mg at 03/23/16 0931  . fluticasone (FLONASE) 50 MCG/ACT nasal spray 2 spray  2 spray Each Nare BID Shari Prows, MD   2 spray at 03/23/16 0931  . magnesium hydroxide (MILK OF MAGNESIA) suspension 30 mL  30 mL Oral Daily PRN Darliss Ridgel, MD      . Melene Muller ON 03/24/2016] paliperidone (INVEGA SUSTENNA) injection 156 mg  156 mg Intramuscular Once Akosua Constantine B Zvi Duplantis, MD      . paliperidone (INVEGA SUSTENNA) injection 234 mg  234 mg Intramuscular Q28 days Shari Prows, MD   234 mg at 03/20/16 1659  . pseudoephedrine (SUDAFED) tablet 30 mg  30 mg Oral BID AC & HS Krystian Younglove B Davinity Fanara, MD   30 mg at 03/23/16 0820  .  risperiDONE (RISPERDAL) tablet 3 mg  3 mg Oral QHS Brieana Shimmin B Geraldo Haris, MD        Lab Results: No results found for this or any previous visit (from the past 48 hour(s)).  Blood Alcohol level:  Lab Results  Component Value Date   ETH <5 03/15/2016    Physical Findings: AIMS: Facial and Oral Movements Muscles of Facial Expression: None, normal Lips and Perioral Area: None, normal Jaw: None, normal Tongue: None, normal,Extremity Movements Upper (arms, wrists, hands, fingers): None, normal Lower (legs, knees, ankles, toes): None, normal, Trunk Movements Neck, shoulders, hips: None, normal, Overall Severity Severity of abnormal movements (highest score from questions above): None, normal Incapacitation due to abnormal movements: None, normal Patient's awareness of abnormal movements (rate only patient's report): No Awareness, Dental Status Current problems with teeth and/or dentures?: No Does patient usually wear dentures?: No  CIWA:  CIWA-Ar Total: 0 COWS:  COWS Total Score: 0  Musculoskeletal: Strength & Muscle Tone: within normal limits Gait & Station: normal Patient leans: N/A  Psychiatric Specialty Exam: Review of Systems  Psychiatric/Behavioral: Positive for hallucinations.  All other systems reviewed and are negative.   Blood pressure 110/57, pulse 67, temperature 98.9 F (37.2 C), temperature source Oral, resp. rate 18, height  (1.727 m), weight 63.504 kg (140 lb).Body mass index is 21.29 kg/(m^2).  General Appearance: Casual  Eye Contact::  Good  Speech:  Clear and Coherent  Volume:  Normal  Mood:  Euthymic  Affect:  Appropriate  Thought Process:  Goal Directed  Orientation:  Full (Time, Place, and Person)  Thought Content:  Hallucinations: Auditory  Suicidal Thoughts:  No  Homicidal Thoughts:  No  Memory:  Immediate;   Fair Recent;   Fair Remote;   Fair  Judgement:  Impaired  Insight:  Shallow  Psychomotor Activity:  Normal  Concentration:  Fair   Recall:  [employer] of Knowledge:Fair  Language: Fair  Akathisia:  No  Handed:  Right  AIMS (if indicated):     Assets:  Communication Skills Desire for Improvement Housing Physical Health Resilience Social Support Transportation Vocational/Educational  ADL's:  Intact  Cognition: WNL  Sleep:  Number of Hours: 7   Treatment Plan Summary: Daily contact with patient to assess and evaluate symptoms and progress in treatment and Medication management   Todd [name] is a 20 year old single Hispanic male with no significant past psychiatric history who was brought to the emergency room after being seen at [facility] with active psychotic symptoms. Toxicology screen was negative in the emergency room. The patient has never been hospitalized before and has no past psychiatric  history.   1. Mood/Psychosis. The patient was started on Celexa for presumed depression and Risperdal for psychosis. We gave Invega sustenna 234 mg to improve compliance. Next dose on 03/24/2016. We discontinue morning Risperdal and continue 3 mg at bedtime. We discontinued Celexa as there are no depressive symptoms present.  2. Metabolic syndrome screening. Total cholesterol and Hemoglobin A1c are normal. Prolactin 34.6.   3. Substance abuse. Toxicology screen was negative in the emergency room and the patient was not intoxicated at the time of admission. The patient was advised to abstain from alcohol and all illicit drugs as they may worsen psychosis and mood symptoms. We'll need to address outpatient substance abuse treatment at the time of discharge. He is currently being drug tested on a regular basis for probation.   4. Disposition. He will be discharged to home with his parents. He will follow up with [facility].   Kristine Linea, MD 03/23/2016, 11:12 AM

## 2016-03-23 NOTE — Plan of Care (Signed)
Problem: Ineffective individual coping Goal: STG: Patient will remain free from self harm Outcome: Progressing Patient remains free from self harm currently     

## 2016-03-23 NOTE — Progress Notes (Signed)
D:  Patient is alert and oriented on the unit this shift.  Patient attended and minimally participated in groups today.  Patient denies suicidal ideation or homicidal ideation currently.  When asked about auditory or visual hallucinations, patient replies "not really."   A:  Scheduled medications are administered to patient as per MD orders.  Emotional support and encouragement are provided.  Patient is maintained on q.15 minute safety checks.  Patient is informed to notify staff with questions or concerns. R:  No adverse medication reactions are noted.  Patient is cooperative with medication administration and treatment plan today.  Patient is receptive, calm and cooperative on the unit at this time.  Patient interacts minimally with others on the unit this shift.  Patient contracts for safety at this time.  Patient remains safe at this time.
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
## 2016-03-23 NOTE — Plan of Care (Signed)
Problem: Alteration in thought process Goal: LTG-Patient behavior demonstrates decreased signs psychosis (Patient behavior demonstrates decreased signs of psychosis to the point the patient is safe to return home and continue treatment in an outpatient setting.)  Outcome: Progressing Patient denies psychosis.

## 2016-03-23 NOTE — Progress Notes (Signed)
D: Pt denies SI/HI/AVH. Pt is pleasant and cooperative, affect is flat and sad but brightens upon approach. Pt appears less anxious and he is interacting with peers and staff appropriately.  A: Pt was offered support and encouragement. Pt was given scheduled medications. Pt was encouraged to attend groups. Q 15 minute checks were done for safety.  R:Pt attends groups and interacts well with peers and staff. Pt is taking medication. Pt has no complaints.Pt receptive to treatment and safety maintained on unit.   

## 2016-03-23 NOTE — [facility] Group Notes (Signed)
[facility] Group Notes:  (Nursing/MHT/Case Management/Adjunct)  Date:  03/23/2016  Time:  4:00 PM  Type of Therapy:  Group Therapy  Participation Level:  Did Not Attend  Participation Quality:Summary of Progress/Problems:  Todd [name] 03/23/2016, 4:00 PM

## 2016-03-23 NOTE — B[facility] Suicide Risk Assessment (Signed)
[facility] INPATIENT:  Family/Significant Other Suicide Prevention Education  Suicide Prevention Education:  Contact Attempts: Loney Lohmelda Avalos (mother) [phone] through the use of an interpreter [phone] ID [phone]) has been identified by the patient as the family member/significant other with whom the patient will be residing, and identified as the person(s) who will aid the patient in the event of a mental health crisis.  With written consent from the patient, two attempts were made to provide suicide prevention education, prior to and/or following the patient's discharge.  We were unsuccessful in providing suicide prevention education.  A suicide education pamphlet was given to the patient to share with family/significant other.  Date and time of first attempt:03/23/16 11:50am Date and time of second attempt:03/23/16 11:53am  Lulu Ridingngle, Maddex Garlitz T, MSW, LCSW 03/23/2016, 11:52 AM

## 2016-03-23 NOTE — Plan of Care (Signed)
Problem: Ineffective individual coping Goal: STG-Increase in ability to manage activities of daily living Outcome: Progressing Patient is able to manage his activities of daily living currently

## 2016-03-23 NOTE — B[facility] Group Notes (Signed)
[facility] LCSW Group Therapy  03/23/2016 10:42 AM  Type of Therapy:  Group Therapy  Participation Level:  Minimal  Participation Quality:  Attentive  Affect:  Flat  Cognitive:  Alert  Insight:  Limited  Engagement in Therapy:  Limited  Modes of Intervention:  Discussion, Education, Socialization and Support  Summary of Progress/Problems: Feelings around Relapse. Group members discussed the meaning of relapse and shared personal stories of relapse, how it affected them and others, and how they perceived themselves during this time. Group members were encouraged to identify triggers, warning signs and coping skills used when facing the possibility of relapse. Social supports were discussed and explored in detail. Pt attended group and stayed the entire time. Pt sat quietly and listened to other group members share.    Patton Rabinovich L Giana Castner MSW, LCSWA  03/23/2016, 10:42 AM   

## 2016-03-23 NOTE — Progress Notes (Signed)
Recreation Therapy Notes  Date: 04.07.17 Time: 1:00 pm Location: Craft Room  Group Topic: Communication, Problem Solving, Teamwork  Goal Area(s) Addresses:  Patient will work in teams towards shared goal. Patient will verbalize skills needed to make activity successful. Patient will verbalize benefit of using skills identified to reach post d/c goals.  Behavioral Response: Attentive, Interactive  Intervention: Landing Pad  Activity: Patients were divided into teams and instructed to build a landing pad out of 15 straws and approximately 3 feet of tape that would catch a golf ball that was dropped from approximately 4 feet.  Education: LRT educated patients on healthy support systems.  Education Outcome: Acknowledges education/In group clarification offered   Clinical Observations/Feedback: Patient worked with peers to build landing pad. Patient used effective communication, teamwork, and problem solving. Patient contributed to group discussion by stating that his team was successful, why communication is important.  Jacquelynn CreeGreene,Ianmichael Amescua M, LRT/CTRS 03/23/2016 3:03 PM

## 2016-03-23 NOTE — [facility] Suicide Risk Assessment (Signed)
[facility] Suicide Risk Assessment   Principal Problem: Bipolar I disorder, single manic episode, severe, with psychosis [facility](HCC) Discharge Diagnoses:  Patient Active Problem List   Diagnosis Date Noted  . Tobacco use disorder [F17.200] 03/19/2016  . Cannabis use disorder, mild, abuse [F12.10] 03/18/2016  . Bipolar I disorder, single manic episode, severe, with psychosis (HCC) [F30.2] 03/16/2016    Total Time spent with patient: 30 minutes  Musculoskeletal: Strength & Muscle Tone: within normal limits Gait & Station: normal Patient leans: N/A  Psychiatric Specialty Exam: Review of Systems  All other systems reviewed and are negative.   Blood pressure 110/57, pulse 67, temperature 98.9 F (37.2 C), temperature source Oral, resp. rate 18, height 5\' 8"  (1.727 m), weight 63.504 kg (140 lb).Body mass index is 21.29 kg/(m^2).  General Appearance: Casual  Eye Contact::  Good  Speech:  Clear and Coherent409  Volume:  Normal  Mood:  Euthymic  Affect:  Appropriate  Thought Process:  Goal Directed  Orientation:  Full (Time, Place, and Person)  Thought Content:  WDL  Suicidal Thoughts:  No  Homicidal Thoughts:  No  Memory:  Immediate;   Fair Recent;   Fair Remote;   Fair  Judgement:  Fair  Insight:  Shallow  Psychomotor Activity:  Normal  Concentration:  Fair  Recall:  FiservFair  Fund of Knowledge:Fair  Language: Fair  Akathisia:  No  Handed:  Right  AIMS (if indicated):     Assets:  Communication Skills Desire for Improvement Housing Physical Health Resilience Social Support Transportation Vocational/Educational  Sleep:  6 hours  Cognition: WNL  ADL's:  Intact   Mental Status Per Nursing Assessment::   On Admission:  NA (Denies)  Demographic Factors:  Male and Adolescent or young adult  Loss Factors: NA  Historical Factors: Family history of mental illness or substance abuse and Impulsivity  Risk Reduction Factors:   Sense of responsibility to family,  Employed, Living with another person, especially a relative and Positive social support  Continued Clinical Symptoms:  Bipolar Disorder:   Mixed State Alcohol/Substance Abuse/Dependencies  Cognitive Features That Contribute To Risk:  None    Suicide Risk:  Minimal: No identifiable suicidal ideation.  Patients presenting with no risk factors but with morbid ruminations; may be classified as minimal risk based on the severity of the depressive symptoms  Follow-up Information    Follow up with [facility]. Go on 03/26/2016.   Why:  For follow-up care appt, walkin on Monday 03/26/16 at 8:00am.   Contact information:   [address] [place], [place] Ph [phone] Fax [phone] (walk in hours M-F 8am-4pm)       Plan Of Care/Follow-up recommendations:  Activity:  as tolerated. Diet:  regular. Other:  keep follow up appointments.  Kristine LineaJolanta Armen Waring, MD 03/23/2016, 7:30 PM

## 2016-03-24 MED ORDER — PALIPERIDONE PALMITATE 234 MG/1.5ML IM SUSP: 234.0000 mg | INTRAMUSCULAR | Status: DC

## 2016-03-24 MED ORDER — RISPERIDONE 3 MG PO TABS: 3.0000 mg | ORAL_TABLET | Freq: Every day | ORAL | Status: DC

## 2016-03-24 NOTE — Progress Notes (Signed)
D: Observed pt interacting in dayroom. Patient alert and oriented x4. Patient denies SI/HI/AVH. Pt affect is appropriate. When asked about AVH pt "states not really" when asked to elaborate he stated "If I ask for it, it will come". Pt euphorically stated "I feel alive, I feel good." When asked about mood. Pt has no complaints. Pt asked Clinical research associatewriter a lot of questions about career and school. A: Offered active listening and support. Provided therapeutic communication. Administered scheduled medications. Encouraged pt to continue attending groups and actively participate in care. R: Pt pleasant and cooperative. Pt medication compliant. Will continue Q15 min. checks. Safety maintained.

## 2016-03-24 NOTE — Plan of Care (Signed)
Problem: Alteration in thought process Goal: LTG-Patient behavior demonstrates decreased signs psychosis (Patient behavior demonstrates decreased signs of psychosis to the point the patient is safe to return home and continue treatment in an outpatient setting.)  Outcome: Progressing Pt denies AVH and is not showing any signs or responding to internal stimuli.

## 2016-03-24 NOTE — Progress Notes (Signed)
  [facility] :  Will you be returning to the same living situation after discharge:  Yes,  home At discharge, do you have transportation home?: Yes,  parent  Do you have the ability to pay for your medications: Yes,  mental health   Release of information consent forms completed and in the chart;  Patient's signature needed at discharge.  Patient to Follow up at: Follow-up Information    Follow up with [facility]. Go on 03/26/2016.   Why:  For follow-up care appt, walkin on Monday 03/26/16 at 8:00am.   Contact information:   [address] [place], [place] [phone] Fax [phone] (walk in hours M-F 8am-4pm)       Next level of care provider has access to [facility] Link:no  Safety Planning and Suicide Prevention discussed: Yes,  with patient   Have you used any form of tobacco in the last 30 days? (Cigarettes, Smokeless Tobacco, Cigars, and/or Pipes): No  Has patient been referred to the Quitline?: N/A patient is not a smoker  Patient has been referred for addiction treatment: Pt. refused referral  Rondall AllegraCandace L Irvin Bastin MSW, LCSWA  03/24/2016, 9:11 AM

## 2016-03-24 NOTE — Progress Notes (Addendum)
Patient with appropriate affect, cooperative behavior with meals, meds and plan of care. No SI/HI/AVH at this time. Patient to discharge today when discharge plan and transportation in place. Administered Invega IM as ordered. Patient acknowledges return of all belongings. Awaiting parents to pickup in private car for transportation. Verbalized understanding rt recommended discharge plan of care.

## 2016-03-24 NOTE — [facility] Group Notes (Signed)
[facility] Group Notes:  (Nursing/MHT/Case Management/Adjunct)  Date:  03/24/2016  Time:  10:39 AM  Type of Therapy:  goal setting   Participation Level:  Did Not Attend  Todd [name] C [name] 03/24/2016, 10:39 AM

## 2016-03-26 NOTE — Progress Notes (Signed)
Recreation Therapy Notes  INPATIENT RECREATION TR PLAN  Patient Details Name: Todd [name] MRN: [id number] DOB: Mar 24, 1996 Today's Date: 03/26/2016  Rec Therapy Plan Is patient appropriate for Therapeutic Recreation?: Yes Treatment times per week: At least once a week TR Treatment/Interventions: 1:1 session, Group participation (Comment) (Appropriate participation in daily recreation therapy tx)  Discharge Criteria Pt will be discharged from therapy if:: Treatment goals are met, Discharged Treatment plan/goals/alternatives discussed and agreed upon by:: Patient/family  Discharge Summary Short term goals set: See Care Plan Short term goals met: Complete Progress toward goals comments: One-to-one attended Which groups?: Coping skills, Leisure education, Social skills, Self-esteem One-to-one attended: Self-esteem, stress management Reason goals not met: N/A Therapeutic equipment acquired: None Reason patient discharged from therapy: Discharge from hospital Pt/family agrees with progress & goals achieved: Yes Date patient discharged from therapy: 03/24/16   Leonette [facility], LRT/CTRS 03/26/2016, 11:13 AM

## 2016-05-28 ENCOUNTER — Other Ambulatory Visit: Payer: Self-pay

## 2016-05-28 ENCOUNTER — Encounter: Payer: Self-pay | Admitting: Internal Medicine

## 2016-05-28 ENCOUNTER — Ambulatory Visit (INDEPENDENT_AMBULATORY_CARE_PROVIDER_SITE_OTHER): Payer: Self-pay | Admitting: Internal Medicine

## 2016-05-28 VITALS — BP 122/62 | HR 84 | Resp 16 | Ht 67.0 in | Wt 169.0 lb

## 2016-05-28 DIAGNOSIS — N63 Unspecified lump in breast: Secondary | ICD-10-CM

## 2016-05-28 DIAGNOSIS — N6310 Unspecified lump in the right breast, unspecified quadrant: Secondary | ICD-10-CM

## 2016-05-28 NOTE — Progress Notes (Signed)
   Subjective:    Patient ID: [name], male    DOB: 08/11/1996, 20 y.o.   MRN: [id number]  HPI   Noted a breast mass on right side under the nipple.  A bit tender.  Does seem to have enlarged since first noted [facility]   Was on Risperdal for 2 weeks, then stopped about 1 month ago.    Reportedly carrying the diagnosis of Bipolar Disorder now.  He does not recall hearing a diagnosis of schizophrenia or schizoaffective. Does admit to being awake without sleep for days and not being tired.  Also, hypersexual. Describes being powerful in his mind--describes delusions of grandeur before medication.  Had planned to give all his clothes away.   Current outpatient prescriptions:  .  paliperidone (INVEGA SUSTENNA) 234 MG/1.5ML SUSP injection, Inject 234 mg into the muscle every 28 (twenty-eight) days., Disp: 0.9 mL, Rfl: 1 .  risperiDONE (RISPERDAL) 3 MG tablet, Take 1 tablet (3 mg total) by mouth at bedtime. (Patient not taking: Reported on 05/28/2016), Disp: 30 tablet, Rfl: 0   No Known Allergies    Review of Systems     Objective:   Physical Exam  Appears to have normal interaction  Has gained significant weight--was very thin previously Left breast area without abnormality. Right breast with 1.5 cm mass below areola.  Very mildy tender. No overlying redness or swelling. Right axilla without mass or adenopathy       Assessment & Plan:  1.  Right breast cyst vs. Gynecomastia:  The latter can occur with either of his recent/current psychotropics. Will check ultrasound to confirm cyst vs. Gynecomastia. Discussed pt. Should not consider stopping his med at this time--to wait to discuss with psychiatry until after we get results.  2.  Bipolar I:  Appears to be doing well.

## 2016-05-28 NOTE — Addendum Note (Signed)
Addended by: Daphine DeutscherPEARSON, Canesha Tesfaye P on: 05/28/2016 05:44 PM   Modules accepted: Orders

## 2016-06-01 ENCOUNTER — Other Ambulatory Visit: Payer: Self-pay | Admitting: Internal Medicine

## 2016-06-01 ENCOUNTER — Ambulatory Visit
Admission: RE | Admit: 2016-06-01 | Discharge: 2016-06-01 | Disposition: A | Discharge status: Home / Self Care | Payer: No Typology Code available for payment source | Source: Ambulatory Visit | Attending: Internal Medicine | Admitting: Internal Medicine

## 2016-06-01 ENCOUNTER — Ambulatory Visit: Admission: RE | Admit: 2016-06-01 | Payer: No Typology Code available for payment source | Source: Ambulatory Visit

## 2016-06-01 DIAGNOSIS — N6310 Unspecified lump in the right breast, unspecified quadrant: Secondary | ICD-10-CM

## 2016-06-15 NOTE — Progress Notes (Signed)
Patient informed about the breast tissue and stated that his dose of the medication has since been decreased. He does not know his psychiatrists name but will call back on Monday with the information.

## 2016-06-26 NOTE — Progress Notes (Signed)
Patient came into the office today and signed a release for [facility] and gave the name of the provider he is seeing there. See other notes please.

## 2016-08-28 ENCOUNTER — Encounter: Payer: Self-pay | Admitting: Internal Medicine

## 2016-08-28 ENCOUNTER — Ambulatory Visit (INDEPENDENT_AMBULATORY_CARE_PROVIDER_SITE_OTHER): Payer: Self-pay | Admitting: Internal Medicine

## 2016-08-28 VITALS — BP 120/78 | HR 60 | Temp 98.0°F | Resp 14 | Ht 67.0 in | Wt 178.0 lb

## 2016-08-28 DIAGNOSIS — N4829 Other inflammatory disorders of penis: Secondary | ICD-10-CM

## 2016-08-28 LAB — GLUCOSE, POCT (MANUAL RESULT ENTRY): POC GLUCOSE: 114 mg/dL — AB (ref 70–99)

## 2016-08-28 MED ORDER — FLUCONAZOLE 150 MG PO TABS: ORAL_TABLET | ORAL | 0 refills | Status: DC

## 2016-08-28 MED ORDER — CLOTRIMAZOLE 1 % EX CREA: 1.0000 "application " | TOPICAL_CREAM | Freq: Two times a day (BID) | CUTANEOUS | 0 refills | Status: DC

## 2016-08-28 NOTE — Patient Instructions (Signed)
Call if no improvement in next 4-5 days or if worsens

## 2016-08-28 NOTE — Progress Notes (Signed)
   Subjective:    Patient ID: Todd [name], male    DOB: 10/06/1996, 20 y.o.   MRN: [id number]  HPI  Developed itchiness and rednessNo penile discharge.  No dysuria.  No testicular pain   Not involved in anything recently where h beneath foreskin for about 1 week, then developed swelling of foreskin--states has had the problem for about 3 weeks.   e was sweating more or wearing tight nonbreathable clothing.  Wears boxer briefs or loose cotton boxers. Was sexually active.  No intercourse in one month, because of this.  Has been sexually active with women he meets at parties, but has always used a condom. Has not treated the area with anything.   Has not tried anything for the itching.  Current Meds  Medication Sig  . paliperidone (INVEGA SUSTENNA) 234 MG/1.5ML SUSP injection Inject 234 mg into the muscle every 28 (twenty-eight) days. (Patient taking differently: Inject 117 mg into the muscle every 28 (twenty-eight) days. )   No Known Allergies       Review of Systems     Objective:   Physical Exam  GU:  Uncircumcised penis.  NT down shaft.  External foreskin appears normal.  When retract foreskin, the foreskin in contact with glans penis is irritated, inflammed, and wet, as is glans.  Mild swelling of glans. No discharge from penile urethra.   Testicles without erythema, swelling or tenderness. No inguinal adenopathy Swab taken from penile urethra for GC/Chlamydia.        Assessment & Plan:  1.  Balanitis:  Appears likely to be yeast infection, but with risky sexual behavior, will work up for STDs  GC/Chlamydia, HIV, RPR. Also, check fingerstick glucose. Fluconazole 150 mg daily for 2 days.  To call if does not take care of this.

## 2016-08-29 LAB — RPR QUALITATIVE: RPR Ser Ql: NONREACTIVE

## 2016-08-29 LAB — HIV ANTIBODY (ROUTINE TESTING W REFLEX): HIV SCREEN 4TH GENERATION: NONREACTIVE

## 2016-08-30 LAB — GC/CHLAMYDIA PROBE AMP
Chlamydia trachomatis, NAA: NEGATIVE
NEISSERIA GONORRHOEAE BY PCR: NEGATIVE

## 2016-09-04 ENCOUNTER — Other Ambulatory Visit (INDEPENDENT_AMBULATORY_CARE_PROVIDER_SITE_OTHER): Payer: Self-pay

## 2016-09-04 DIAGNOSIS — R739 Hyperglycemia, unspecified: Secondary | ICD-10-CM

## 2016-09-05 LAB — HGB A1C W/O EAG: HEMOGLOBIN A1C: 5.3 % (ref 4.8–5.6)

## 2016-09-06 ENCOUNTER — Other Ambulatory Visit: Payer: Self-pay

## 2017-01-30 ENCOUNTER — Ambulatory Visit (INDEPENDENT_AMBULATORY_CARE_PROVIDER_SITE_OTHER): Payer: Self-pay | Admitting: Internal Medicine

## 2017-01-30 ENCOUNTER — Encounter: Payer: Self-pay | Admitting: Internal Medicine

## 2017-01-30 VITALS — BP 120/70 | HR 74 | Resp 12 | Ht 66.75 in | Wt 182.0 lb

## 2017-01-30 DIAGNOSIS — N480 Leukoplakia of penis: Secondary | ICD-10-CM

## 2017-01-30 DIAGNOSIS — F3110 Bipolar disorder, current episode manic without psychotic features, unspecified: Secondary | ICD-10-CM

## 2017-01-30 DIAGNOSIS — B3742 Candidal balanitis: Secondary | ICD-10-CM

## 2017-01-30 MED ORDER — FLUCONAZOLE 150 MG PO TABS
ORAL_TABLET | ORAL | 0 refills | Status: DC
Start: 2017-01-30 — End: 2017-06-01

## 2017-01-30 NOTE — Progress Notes (Signed)
   Subjective:    Patient ID: Todd [name], male    DOB: 09/16/1996, 20 y.o.   MRN: [id number]  HPI   1.  Penile inflammation:  Got better "for a day"  Used Fluconazole and topical Clotrimazole, but keeps coming back.  A1C was 5.3% when presented.  HIV, RPR, GC/Chlamydia all negative.  Was sexually active once with male he did not know well since, but did use a condom.    2.  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 Has not been given Invega in that same time period.  Did not feel well on the medication:  Was fatigued and sleepy all the time.  Gained a lot of weight.  His psychiatrist cut back on the mediation, but he ultimately stopped. He would be willing to be evaluated by Samul DadaN. Knight, LCSW to see how he is doing currently  No outpatient prescriptions have been marked as taking for the 01/30/17 encounter (Office Visit) with Julieanne MansonElizabeth Riyan Gavina, MD.    No Known Allergies   Review of Systems     Objective:   Physical Exam  Penis with erythema on dorsal glans and underside of foreskin in same area. Wet.  Swab taken did not show pseudohyphae or budding yeast on wet prep or KOH No other areas of inflammation.  No penile discharge.      Assessment & Plan:  Still appears to be a mild balanitis as in September Treat with 3 days of oral Fluconazole and with topical terinafine this time To follow up in 1 week.  If no improvement, consider derm referral. He denies any other topical or exposure. Had not used a condom in some time and only once since last seen, so unlikely contact derm reaction as well.

## 2017-01-30 NOTE — Patient Instructions (Signed)
Terbinafine cream 1%:  Apply twice daily to affected area for 14 days.

## 2017-02-06 ENCOUNTER — Other Ambulatory Visit (INDEPENDENT_AMBULATORY_CARE_PROVIDER_SITE_OTHER): Payer: Self-pay | Admitting: Internal Medicine

## 2017-02-06 DIAGNOSIS — N481 Balanitis: Secondary | ICD-10-CM

## 2017-02-06 NOTE — Progress Notes (Signed)
Here for labs to evaluate for recurrent balanitis. HIV, RPR, BMP, urine GC/chlamydia States inflammation on glans penis and foreskin is almost gone--seems the Fluconazole really helped.  Still using terbinafine cream  Checked glans penis:  The foreskin is now normal appearing, but top of glans seems to have increased erythema--has spread bilaterally around top circumference.  He states that is new today.  Her forgot to place Terbinafine. Discussed needs to place twice daily for 14 days.

## 2017-02-07 LAB — BASIC METABOLIC PANEL
BUN / CREAT RATIO: 16 (ref 9–20)
BUN: 14 mg/dL (ref 6–20)
CO2: 23 mmol/L (ref 18–29)
CREATININE: 0.89 mg/dL (ref 0.76–1.27)
Calcium: 9.7 mg/dL (ref 8.7–10.2)
Chloride: 98 mmol/L (ref 96–106)
GFR calc non Af Amer: 123 (ref >59)
GFR, EST AFRICAN AMERICAN: 142 (ref >59)
GLUCOSE: 94 mg/dL (ref 65–99)
Potassium: 4.5 mmol/L (ref 3.5–5.2)
Sodium: 140 mmol/L (ref 134–144)

## 2017-02-07 LAB — RPR QUALITATIVE: RPR: NONREACTIVE

## 2017-02-07 LAB — SPECIMEN STATUS REPORT

## 2017-02-09 LAB — GC/CHLAMYDIA PROBE AMP
CHLAMYDIA, DNA PROBE: NEGATIVE
NEISSERIA GONORRHOEAE BY PCR: NEGATIVE

## 2017-02-09 LAB — SPECIMEN STATUS REPORT

## 2017-02-11 ENCOUNTER — Telehealth: Payer: Self-pay | Admitting: Licensed Clinical Social Worker

## 2017-02-11 NOTE — Telephone Encounter (Signed)
LCSW called pt to check in regarding needs. Pt reported that he has been feeling good ever since he stopped his psychotropic meds. He declined counseling at this time, as he is working. He reported that he will call LCSW if he needs anything in the future.

## 2017-02-20 ENCOUNTER — Ambulatory Visit: Payer: Self-pay | Admitting: Internal Medicine

## 2017-02-22 ENCOUNTER — Encounter: Payer: Self-pay | Admitting: Internal Medicine

## 2017-02-22 ENCOUNTER — Ambulatory Visit (INDEPENDENT_AMBULATORY_CARE_PROVIDER_SITE_OTHER): Payer: Self-pay | Admitting: Internal Medicine

## 2017-02-22 VITALS — BP 112/58 | HR 65 | Ht 67.0 in | Wt 181.0 lb

## 2017-02-22 DIAGNOSIS — F302 Manic episode, severe with psychotic symptoms: Secondary | ICD-10-CM

## 2017-02-22 DIAGNOSIS — S76912A Strain of unspecified muscles, fascia and tendons at thigh level, left thigh, initial encounter: Secondary | ICD-10-CM

## 2017-02-22 DIAGNOSIS — N481 Balanitis: Secondary | ICD-10-CM

## 2017-02-22 NOTE — Progress Notes (Signed)
   Subjective:    Patient ID: Todd [name], male    DOB: 12/26/1995, 21 y.o.   MRN: [id number]  HPI  States he has been treating irritated dorsal area of penis beneath foreskin on glans.  Was better when using Fluconazole and when had twice daily Terbinfafine.  Did not apply cream last night and seems worse today. HIV testing did not get done with previous visit--somehow order did not get received by [facility].  Left leg pain:  Was playing soccer a couple of days ago and kicking the ball with his left leg, which is not usual for him.  Now with pain in his quadriceps.  Bipolar Disorder:  Does not want to get continued care with psych.  Feels he knows that when he is delusional/psychotic, he will be able to realize what is real and not.  Discussed the problem is that he likely will not, he is recognizing his behavior and thoughts previously were a problem now that he is not in a manic or depressed mode, but will likely not be able to make that determination during an episode.  Meds:   Terbinafine cream 1% twice daily to affected area for 14 days.  No Known Allergies        Review of Systems     Objective:   Physical Exam  NAD Minimal what appears to be superficial erythema on dorsum of glans at base only. Perhaps a bit on overlying foreskin as well.  No other genital lesion and no penile discharge.  Tender over left thigh adductors and quadriceps. No palpable abnormality or overlying erythema.        Assessment & Plan:  1.  Penile inflammation/balantis:  Looks improved, but should be resolved at this point.  Referral to Dermatology for further evaluation and treatment.  Recheck HIV as has had unprotected sex since last check with someone not well known to him.  2.  Bipolar Disorder:  Encouraged patient to at least keep in touch with his psychiatrist in case he has another depressed or manic episode.  Discussed medication to prevent behaviors that could be life  threatening or cause significant hardship for him is a good idea.  He still feels he will be able to recognize delusional or paranoid thinking should it recur in the future.  3.  Mild left thigh muscle strain.  Rest, warm pack at this point, ibuprofen and gentle stretching.

## 2017-02-22 NOTE — Patient Instructions (Signed)
Ibuprofen 400-800 mg every 8 hours as needed for thigh pain. Stretches of thigh twice daily for 10 minutes.

## 2017-02-23 LAB — HIV ANTIBODY (ROUTINE TESTING W REFLEX): HIV SCREEN 4TH GENERATION: NONREACTIVE

## 2017-05-30 ENCOUNTER — Encounter (HOSPITAL_COMMUNITY): Payer: Self-pay | Admitting: Emergency Medicine

## 2017-05-30 ENCOUNTER — Emergency Department (HOSPITAL_COMMUNITY)
Admission: EM | Admit: 2017-05-30 | Discharge: 2017-05-31 | Disposition: A | Discharge status: Home / Self Care | Payer: No Typology Code available for payment source | Attending: Emergency Medicine | Admitting: Emergency Medicine

## 2017-05-30 DIAGNOSIS — Z711 Person with feared health complaint in whom no diagnosis is made: Secondary | ICD-10-CM | POA: Insufficient documentation

## 2017-05-30 DIAGNOSIS — Z87891 Personal history of nicotine dependence: Secondary | ICD-10-CM | POA: Insufficient documentation

## 2017-05-30 LAB — BASIC METABOLIC PANEL
Anion gap: 10 (ref 5–15)
BUN: 15 mg/dL (ref 6–20)
CHLORIDE: 102 mmol/L (ref 101–111)
CO2: 23 mmol/L (ref 22–32)
CREATININE: 1.02 mg/dL (ref 0.61–1.24)
Calcium: 9.5 mg/dL (ref 8.9–10.3)
GFR calc non Af Amer: >60 mL/min (ref >60)
Glucose, Bld: 119 mg/dL — ABNORMAL HIGH (ref 65–99)
Potassium: 3.9 mmol/L (ref 3.5–5.1)
SODIUM: 135 mmol/L (ref 135–145)

## 2017-05-30 LAB — URINALYSIS, ROUTINE W REFLEX MICROSCOPIC
Bacteria, UA: NONE SEEN
Bilirubin Urine: NEGATIVE
Glucose, UA: NEGATIVE mg/dL
Hgb urine dipstick: NEGATIVE
KETONES UR: NEGATIVE mg/dL
Leukocytes, UA: NEGATIVE
Nitrite: NEGATIVE
Protein, ur: 30 mg/dL — AB
Specific Gravity, Urine: 1.035 — ABNORMAL HIGH (ref 1.005–1.030)
Squamous Epithelial / LPF: NONE SEEN
pH: 5 (ref 5.0–8.0)

## 2017-05-30 LAB — CBC
HCT: 47.6 % (ref 39.0–52.0)
HEMOGLOBIN: 16.7 g/dL (ref 13.0–17.0)
MCH: 31.8 pg (ref 26.0–34.0)
MCHC: 35.1 g/dL (ref 30.0–36.0)
MCV: 90.7 fL (ref 78.0–100.0)
Platelets: 294 10*3/uL (ref 150–400)
RBC: 5.25 MIL/uL (ref 4.22–5.81)
RDW: 12.7 % (ref 11.5–15.5)
WBC: 8.8 10*3/uL (ref 4.0–10.5)

## 2017-05-30 NOTE — ED Triage Notes (Signed)
Pt presents requesting STD screening, pt reports that he has "red bumps" that come and go. Denies dysuria, abd pain.

## 2017-05-30 NOTE — ED Provider Notes (Signed)
[facility]-EMERGENCY DEPT Provider Note   CSN: [id number] Arrival date & time: 05/30/17  1946     History   Chief Complaint Chief Complaint  Patient presents with  . SEXUALLY TRANSMITTED DISEASE    HPI [facility] Todd [name] is a 21 y.o. male.  21 year old male with a history of bipolar 1 disorder presents to the emergency department for evaluation of his genitals. He states that he noticed some red bumps to his penis earlier today. He states that they are no longer present. No medications taken prior to arrival. Patient denies fever, dysuria, hematuria, penile discharge, N/V. He reports that his last sexual encounter was 2 months ago. He denies concern for STDs. He states that he wants to "make sure I don't have a fungus on my penis".     History reviewed. No pertinent past medical history.  Patient Active Problem List   Diagnosis Date Noted  . Tobacco use disorder 03/19/2016  . Cannabis use disorder, mild, abuse 03/18/2016  . Bipolar I disorder, single manic episode, severe, with psychosis (HCC) 03/16/2016    No past surgical history on file.    Home Medications    Prior to Admission medications   Medication Sig Start Date End Date Taking? Authorizing Provider  fluconazole (DIFLUCAN) 150 MG tablet 1 tab by mouth daily for 3 days Patient not taking: Reported on 02/22/2017 01/30/17   Julieanne Manson, MD  paliperidone (INVEGA SUSTENNA) 234 MG/1.5ML SUSP injection Inject 234 mg into the muscle every 28 (twenty-eight) days. Patient not taking: Reported on 01/30/2017 03/24/16   Shari Prows, MD    Family History Family History  Problem Relation Age of Onset  . Hyperlipidemia Father   . Diabetes Maternal Grandmother   . Alcohol abuse Paternal Grandfather     Social History Social History  Substance Use Topics  . Smoking status: Former Smoker    Packs/day: 0.00  . Smokeless tobacco: Never Used  . Alcohol use 0.0 oz/week     Comment: Beer and Tequila on  weekends.  3 beers and 4 shots of Tequila over 2 weekends per month     Allergies   Patient has no known allergies.   Review of Systems Review of Systems Ten systems reviewed and are negative for acute change, except as noted in the HPI.    Physical Exam Updated Vital Signs BP 129/73 (BP Location: Left Arm)   Pulse (!) 105   Temp 98.1 F (36.7 C) (Oral)   Resp 14   Ht 5\' 7"  (1.702 m)   Wt 81.6 kg (180 lb)   SpO2 100%   BMI 28.19 kg/m   Physical Exam  Constitutional: He is oriented to person, place, and time. He appears well-developed and well-nourished. No distress.  Patient calm and cooperative  HENT:  Head: Normocephalic and atraumatic.  Eyes: Conjunctivae and EOM are normal. No scleral icterus.  Neck: Normal range of motion.  Pulmonary/Chest: Effort normal. No respiratory distress.  Respirations even and unlabored  Genitourinary: Testes normal and penis normal. Right testis shows no mass, no swelling and no tenderness. Right testis is descended. Left testis shows no mass, no swelling and no tenderness. Left testis is descended. Uncircumcised. No penile erythema. No discharge found.  Genitourinary Comments: Normal chaperoned GU exam  Musculoskeletal: Normal range of motion.  Neurological: He is alert and oriented to person, place, and time.  Ambulatory with steady gait.  Skin: Skin is warm and dry. No rash noted. He is not diaphoretic. No erythema. No  pallor.  Psychiatric: He has a normal mood and affect. His behavior is normal.  Patient denies SI/HI  Nursing note and vitals reviewed.    ED Treatments / Results  Labs (all labs ordered are listed, but only abnormal results are displayed) Labs Reviewed  URINALYSIS, ROUTINE W REFLEX MICROSCOPIC - Abnormal; Notable for the following:       Result Value   Specific Gravity, Urine 1.035 (*)    Protein, ur 30 (*)    All other components within normal limits  BASIC METABOLIC PANEL - Abnormal; Notable for the  following:    Glucose, Bld 119 (*)    All other components within normal limits  CBC  HIV ANTIBODY (ROUTINE TESTING)  RPR  GC/CHLAMYDIA PROBE AMP ([place]) NOT AT [facility]    EKG  EKG Interpretation None       Radiology No results found.  Procedures Procedures (including critical care time)  Medications Ordered in ED Medications - No data to display   Initial Impression / Assessment and Plan / ED Course  I have reviewed the triage vital signs and the nursing notes.  Pertinent labs & imaging results that were available during my care of the patient were reviewed by me and considered in my medical decision making (see chart for details).     21 y/o male presents over concern for "a fungus on my penis". Genitalia exam normal. This was chaperoned by RN. No recent sexual encounter or discharge. No pyuria on work up. Doubt STIs at this time. No evidence of UTI or balanitis. Labs reassuring. STD panel is pending.  Per registration, family at time of patient's arrival expressed concern about the patient having a manic episode. Family allegedly reported the patient was here for medical clearance. The patient makes no mention of this to me. He is in NAD. He answers questions appropriately. He does not appear to be responding to external or internal stimuli. He denies SI/HI.  Patient stable for discharge.   Final Clinical Impressions(s) / ED Diagnoses   Final diagnoses:  Physically well but worried    New Prescriptions New Prescriptions   No medications on file     Antony MaduraHumes, Valon Glasscock, Cordelia Poche-C 05/31/17 0000    Shaune PollackIsaacs, Cameron, MD 06/01/17 1351

## 2017-05-30 NOTE — Discharge Instructions (Signed)
Follow-up with your primary care doctor as needed

## 2017-05-31 ENCOUNTER — Emergency Department (HOSPITAL_COMMUNITY)
Admission: EM | Admit: 2017-05-31 | Discharge: 2017-05-31 | Disposition: A | Discharge status: Home / Self Care | Payer: No Typology Code available for payment source

## 2017-05-31 ENCOUNTER — Ambulatory Visit (INDEPENDENT_AMBULATORY_CARE_PROVIDER_SITE_OTHER): Payer: Self-pay | Admitting: Internal Medicine

## 2017-05-31 ENCOUNTER — Encounter (HOSPITAL_COMMUNITY): Payer: Self-pay | Admitting: *Deleted

## 2017-05-31 ENCOUNTER — Inpatient Hospital Stay (HOSPITAL_COMMUNITY)
Admission: RE | Admit: 2017-05-31 | Discharge: 2017-06-07 | DRG: 885 | Disposition: A | Discharge status: Home / Self Care | Payer: No Typology Code available for payment source | Attending: Psychiatry | Admitting: Psychiatry

## 2017-05-31 ENCOUNTER — Encounter: Payer: Self-pay | Admitting: Internal Medicine

## 2017-05-31 VITALS — BP 120/80 | HR 100 | Resp 17 | Ht 67.0 in | Wt 171.0 lb

## 2017-05-31 DIAGNOSIS — Z9114 Patient's other noncompliance with medication regimen: Secondary | ICD-10-CM | POA: Diagnosis not present

## 2017-05-31 DIAGNOSIS — F319 Bipolar disorder, unspecified: Secondary | ICD-10-CM | POA: Diagnosis present

## 2017-05-31 DIAGNOSIS — Z811 Family history of alcohol abuse and dependence: Secondary | ICD-10-CM | POA: Diagnosis not present

## 2017-05-31 DIAGNOSIS — F411 Generalized anxiety disorder: Secondary | ICD-10-CM | POA: Diagnosis present

## 2017-05-31 DIAGNOSIS — Z833 Family history of diabetes mellitus: Secondary | ICD-10-CM | POA: Diagnosis not present

## 2017-05-31 DIAGNOSIS — F302 Manic episode, severe with psychotic symptoms: Secondary | ICD-10-CM

## 2017-05-31 DIAGNOSIS — F312 Bipolar disorder, current episode manic severe with psychotic features: Secondary | ICD-10-CM | POA: Diagnosis present

## 2017-05-31 DIAGNOSIS — Z8349 Family history of other endocrine, nutritional and metabolic diseases: Secondary | ICD-10-CM

## 2017-05-31 DIAGNOSIS — G4700 Insomnia, unspecified: Secondary | ICD-10-CM | POA: Diagnosis present

## 2017-05-31 DIAGNOSIS — F1210 Cannabis abuse, uncomplicated: Secondary | ICD-10-CM | POA: Diagnosis present

## 2017-05-31 DIAGNOSIS — F3013 Manic episode, severe, without psychotic symptoms: Secondary | ICD-10-CM

## 2017-05-31 LAB — HIV ANTIBODY (ROUTINE TESTING W REFLEX): HIV Screen 4th Generation wRfx: NONREACTIVE

## 2017-05-31 LAB — RPR: RPR Ser Ql: NONREACTIVE

## 2017-05-31 MED ORDER — ACETAMINOPHEN 325 MG PO TABS
650.0000 mg | ORAL_TABLET | Freq: Four times a day (QID) | ORAL | Status: DC | PRN
  Administered 2017-06-02 – 2017-06-04 (×2): 650 mg via ORAL
  Filled 2017-05-31 (×2): qty 2

## 2017-05-31 MED ORDER — TRAZODONE HCL 50 MG PO TABS
50.0000 mg | ORAL_TABLET | Freq: Every evening | ORAL | Status: DC | PRN
  Administered 2017-06-02 – 2017-06-05 (×4): 50 mg via ORAL
  Filled 2017-05-31 (×10): qty 1
  Filled 2017-05-31: qty 14
  Filled 2017-05-31 (×5): qty 1
  Filled 2017-05-31: qty 14
  Filled 2017-05-31 (×3): qty 1

## 2017-05-31 MED ORDER — HYDROXYZINE HCL 25 MG PO TABS
25.0000 mg | ORAL_TABLET | Freq: Three times a day (TID) | ORAL | Status: DC | PRN
  Administered 2017-06-02 – 2017-06-04 (×2): 25 mg via ORAL
  Filled 2017-05-31: qty 1
  Filled 2017-05-31: qty 10
  Filled 2017-05-31 (×2): qty 1

## 2017-05-31 MED ORDER — MAGNESIUM HYDROXIDE 400 MG/5ML PO SUSP: 30.0000 mL | Freq: Every day | ORAL | Status: DC | PRN

## 2017-05-31 MED ORDER — ALUM & MAG HYDROXIDE-SIMETH 200-200-20 MG/5ML PO SUSP: 30.0000 mL | ORAL | Status: DC | PRN

## 2017-05-31 NOTE — Patient Instructions (Signed)
Go to Behavioral Health at [address] [place] [facility] [place] or [place] where hospitalized before.

## 2017-05-31 NOTE — Progress Notes (Signed)
   Subjective:    Patient ID: Todd [name], male    DOB: 12/13/1996, 21 y.o.   MRN: [id number]  HPI   Patient brought here by his mother.  He thinks he if fine.  Mom states he is distracted and tired.  He is being rude with her--telling her to get out of his room. Normally, he is very respectful. Much like his episode in March of 2017, he is talking about power and he does state people have angels and demons in them and then quietly states"like you" to physician.  Mom states yesterday he asked if she was hearing all the voices he was hearing.  He states today he was not hearing voices--denies he told her that.  Later, asked if the voices were telling him to do anything, he replied they were not, just a bunch of voices he could not understand.  States he had been hearing the voices for about 2 days.   He has not been working this week and is not sleeping, but states he feels great. Denies any drug use.  Had some alcohol a week ago.  Does go home with women from the club and is sexually active with them, but has not done so recently Not spending a lot of money anywhere.   At end of visit, recognize patient was seen in ED yesterday and family brought up concern for another manic episode, but were told to follow up here today.  Mom shares they took him there for his psychiatric concerns, but the patient was able to control the conversation, likely as he is bilingual, and he told the providers he was there for an STD concern.  The family could not get the providers to understand the main concern per mom.   For some reason, [facility] instead of clinic for help.  Patient gave verbal release for me to speak openly with his mother about how we have discussed multiple times that he needs to be seen regularly by psychiatry and get counseling as well as stay on meds.  Discussed need to speak with psychiatry about trying another medication that  may not cause as much weight gain as Invega.  Denies suicidal or homicidal ideation.  No outpatient prescriptions have been marked as taking for the 05/31/17 encounter (Office Visit) with Julieanne MansonMulberry, Sharlie Shreffler, MD.    No Known Allergies  Review of Systems     Objective:   Physical Exam  Sweaty, fidgety Staring suspiciously at examiner throughout.   Lungs:  CTA CV:  RRR without murmur or rub, radial pulses normal and equal      Assessment & Plan:  Recurrent manic episode off meds with diagnosis of Bipolar I.  Having at least auditory hallucinations.   He has already been seen in ED. [place] family [place] like for patient be seen as [place] is much farther away. He is willing currently (or states he is) to go to [facility] to be [facility] and treated/possibly hospitalized to do so. Encouraged mom to work with patient in his providers being able to share information with her in the future to prevent recurrences due to noncompliance.

## 2017-05-31 NOTE — ED Notes (Signed)
Pt verbalized understanding discharge instructions and denies any further needs or questions at this time. VS stable, ambulatory and steady gait.   

## 2017-05-31 NOTE — H&P (Signed)
Behavioral Health Medical Screening Exam  Todd FisherJuan Francisco Pasty ArchHernandez [name] is an 21 y.o. male.  Total Time spent with patient: 15 minutes  Psychiatric Specialty Exam: Physical Exam  Constitutional: He is oriented to person, place, and time. He appears well-developed and well-nourished. No distress.  HENT:  Head: Normocephalic and atraumatic.  Right Ear: External ear normal.  Left Ear: External ear normal.  Eyes: Conjunctivae are normal. Pupils are equal, round, and reactive to light. Right eye exhibits no discharge. Left eye exhibits no discharge. No scleral icterus.  Neck: Normal range of motion.  Cardiovascular: Regular rhythm and normal heart sounds.  Tachycardia present.   Respiratory: Effort normal and breath sounds normal. No respiratory distress.  Musculoskeletal: Normal range of motion.  Neurological: He is alert and oriented to person, place, and time.  Skin: Skin is warm and dry. He is not diaphoretic.  Psychiatric: His mood appears anxious. His affect is blunt. He is slowed and withdrawn. Thought content is not paranoid and not delusional. Cognition and memory are normal. He expresses impulsivity and inappropriate judgment. He exhibits a depressed mood. He expresses no homicidal and no suicidal ideation.    Review of Systems  Psychiatric/Behavioral: Positive for depression, hallucinations, substance abuse and suicidal ideas. Negative for memory loss. The patient is nervous/anxious and has insomnia.   All other systems reviewed and are negative.   Blood pressure 133/82, pulse (!) 103, temperature 98.6 F (37 C), temperature source Oral, resp. rate 16, SpO2 100 %.There is no height or weight on file to calculate BMI.  General Appearance: Casual and Fairly Groomed  Eye Contact:  Fair  Speech:  Clear and Coherent and Slow  Volume:  Decreased  Mood:  Anxious, Depressed and Irritable  Affect:  Blunt  Thought Process:  Coherent  Orientation:  Full (Time, Place, and Person)   Thought Content:  Logical and Hallucinations: Denies  Suicidal Thoughts:  No  Homicidal Thoughts:  No  Memory:  Immediate;   Fair Recent;   Fair Remote;   Fair  Judgement:  Impaired  Insight:  Lacking  Psychomotor Activity:  Normal  Concentration: Concentration: Fair and Attention Span: Fair  Recall:  Good  Fund of Knowledge:Good  Language: Good  Akathisia:  No  Handed:  Right  AIMS (if indicated):     Assets:  Manufacturing systems engineerCommunication Skills Housing Leisure Time Physical Health Transportation  Sleep:       Musculoskeletal: Strength & Muscle Tone: within normal limits Gait & Station: normal   Blood pressure 133/82, pulse (!) 103, temperature 98.6 F (37 C), temperature source Oral, resp. rate 16, SpO2 100 %.  Recommendations:  Based on my evaluation the patient does not appear to have an emergency medical condition.  Jackelyn PolingJason A Audrea Bolte, NP 05/31/2017, 9:50 PM
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
## 2017-05-31 NOTE — Tx Team (Signed)
Initial Treatment Plan 05/31/2017 10:48 PM [facility] Pasty ArchHernandez [name] [id number]    PATIENT STRESSORS: Medication change or noncompliance   PATIENT STRENGTHS: Average or above average intelligence Communication skills Supportive family/friends   PATIENT IDENTIFIED PROBLEMS: Psychosis  "Increasing faith"  "Being successful"  "Have good habits"               DISCHARGE CRITERIA:  Ability to meet basic life and health needs Improved stabilization in mood, thinking, and/or behavior Motivation to continue treatment in a less acute level of care Need for constant or close observation no longer present Verbal commitment to aftercare and medication compliance  PRELIMINARY DISCHARGE PLAN: Participate in family therapy Return to previous living arrangement Return to previous work or school arrangements  PATIENT/FAMILY INVOLVEMENT: This treatment plan has been presented to and reviewed with the patient, [name].  The patient and family have been given the opportunity to ask questions and make suggestions.  Carleene OverlieMiddleton, Fiana Gladu P, RN 05/31/2017, 10:48 PM

## 2017-05-31 NOTE — Progress Notes (Signed)
Admission Note:  21 year old male who presents as a walk in, in no acute distress, for bizarre behavior.  Patient states "I'm here because my mom thought that I wasn't acting normal".  Patient was calm and cooperative with admission process. Patient denies SI and contracts for safety upon admission. Patient currently denies AVH. Patient appears to be responding to internal stimuli.  At times, patient would be slow to respond.  Patient reports hx of AVH.  Patient reports that he has been off of his medications for "4 months" because "I feel good".  Patient states that he is acting the way he always acts and is just here to please his mother. Patient denies drug and/or alcohol use. Patient currently lives with his parents and identifies his mother as his support system.  [facility] would like to work on "increasing faith", "Being successful", and "Have good health".  Skin was assessed and found to be clear of any abnormal marks.  Patient searched and no contraband found, POC and unit policies explained and understanding verbalized. Consents obtained. Food and fluids offered and accepted. Patient had no additional questions or concerns.

## 2017-06-01 DIAGNOSIS — F29 Unspecified psychosis not due to a substance or known physiological condition: Secondary | ICD-10-CM

## 2017-06-01 DIAGNOSIS — F82 Specific developmental disorder of motor function: Secondary | ICD-10-CM

## 2017-06-01 DIAGNOSIS — Z811 Family history of alcohol abuse and dependence: Secondary | ICD-10-CM

## 2017-06-01 DIAGNOSIS — G4700 Insomnia, unspecified: Secondary | ICD-10-CM

## 2017-06-01 DIAGNOSIS — F319 Bipolar disorder, unspecified: Secondary | ICD-10-CM

## 2017-06-01 DIAGNOSIS — F4010 Social phobia, unspecified: Secondary | ICD-10-CM

## 2017-06-01 LAB — TSH: TSH: 3.342 u[IU]/mL (ref 0.350–4.500)

## 2017-06-01 LAB — LIPID PANEL
CHOL/HDL RATIO: 5.1 ratio
Cholesterol: 182 mg/dL (ref 0–200)
HDL: 36 mg/dL — ABNORMAL LOW (ref >40)
LDL Cholesterol: 131 mg/dL — ABNORMAL HIGH (ref 0–99)
Triglycerides: 75 mg/dL (ref <150)
VLDL: 15 mg/dL (ref 0–40)

## 2017-06-01 MED ORDER — PALIPERIDONE PALMITATE 234 MG/1.5ML IM SUSP
234.0000 mg | INTRAMUSCULAR | Status: DC
  Administered 2017-06-01: 234 mg via INTRAMUSCULAR
  Filled 2017-06-01: qty 1.5

## 2017-06-01 NOTE — Plan of Care (Signed)
Problem: Safety: Goal: Periods of time without injury will increase Outcome: Progressing Pt safe on the unit at this time   

## 2017-06-01 NOTE — [facility] Suicide Risk Assessment (Signed)
[facility]   Nursing information obtained from:  Patient Demographic factors:  Male Current Mental Status:  NA Loss Factors:  NA Historical Factors:  NA Risk Reduction Factors:  Employed, Living with another person, especially a relative, Positive social support  Total Time spent with patient: 45 minutes Principal Problem: Bipolar disorder with psychotic features (HCC) Diagnosis:   Patient Active Problem List   Diagnosis Date Noted  . Bipolar disorder with psychotic features (HCC) [F31.9] 05/31/2017  . Tobacco use disorder [F17.200] 03/19/2016  . Cannabis use disorder, mild, abuse [F12.10] 03/18/2016  . Bipolar I disorder, single manic episode, severe, with psychosis (HCC) [F30.2] 03/16/2016   Subjective Data: Patient is 21 year old male who was admitted due to having manic episodes and psychosis.  He has been noncompliant with medication and slowly decompensating.  He is irritable, labile and unable to do his job.  Please see history and physical for more detail.  Continued Clinical Symptoms:  Alcohol Use Disorder Identification Test Final Score (AUDIT): 0 The "Alcohol Use Disorders Identification Test", Guidelines for Use in Primary Care, Second Edition.  World Science writerHealth Organization [facility](WHO). Score between 0-7:  no or low risk or alcohol related problems. Score between 8-15:  moderate risk of alcohol related problems. Score between 16-19:  high risk of alcohol related problems. Score 20 or above:  warrants further diagnostic evaluation for alcohol dependence and treatment.   CLINICAL FACTORS:   Bipolar Disorder:   Mixed State   Musculoskeletal: Strength & Muscle Tone: within normal limits Gait & Station: normal Patient leans: N/A  Psychiatric Specialty Exam: Physical Exam  ROS  Blood pressure 135/78, pulse (!) 118, temperature 98.6 F (37 C), temperature source Oral, resp. rate 16, height 5\' 7"  (1.702 m), weight 77.1 kg (170 lb), SpO2 100 %.Body  mass index is 26.63 kg/m.   Please see history and physical for more detailed mental status examination.  COGNITIVE FEATURES THAT CONTRIBUTE TO RISK:  Polarized thinking and Thought constriction (tunnel vision)    SUICIDE RISK:   Mild:  Suicidal ideation of limited frequency, intensity, duration, and specificity.  There are no identifiable plans, no associated intent, mild dysphoria and related symptoms, good self-control (both objective and subjective assessment), few other risk factors, and identifiable protective factors, including available and accessible social support.  PLAN OF CARE: Patient requires inpatient treatment and stabilization.  Please see history and physical for plan of care.  I certify that inpatient services furnished can reasonably be expected to improve the patient's condition.   Aviela Blundell T., MD 06/01/2017, 5:18 PM

## 2017-06-01 NOTE — [facility] Group Notes (Signed)
[facility] LCSW Group Therapy Note  06/01/2017  and  11:15 AM  Type of Therapy and Topic:  Group Therapy: Avoiding Self-Sabotaging and Enabling Behaviors  Participation Level:  Did Not Attend despite RN encouraging pt to go to group   Todd Bernatherine C Seraphina Mitchner, LCSW

## 2017-06-01 NOTE — H&P (Signed)
Psychiatric Admission Assessment Adult  Patient Identification: Todd [name] MRN:  [id number] Date of Evaluation:  06/01/2017  Chief Complaint:  bipolar disorder Principal Diagnosis: Bipolar disorder with psychotic features [facility]) Diagnosis:   Patient Active Problem List   Diagnosis Date Noted  . Bipolar disorder with psychotic features (HCC) [F31.9] 05/31/2017  . Tobacco use disorder [F17.200] 03/19/2016  . Cannabis use disorder, mild, abuse [F12.10] 03/18/2016  . Bipolar I disorder, single manic episode, severe, with psychosis (HCC) [F30.2] 03/16/2016   History of Present Illness: Patient is 21 year old male who is [facility] due to having manic episode and psychosis.  He's been noncompliant with his injection the past few months and slowly decompensating.  His been easily irritable and labile.  He lives with his parents but lately he has difficulty doing his job.  Patient endorsed poor sleep, irritability, racing thoughts but denies any suicidal thoughts or homicidal thought.  Patient was seeing psychiatrist at [facility] but stopped treatment a few months ago.  He is guarded, paranoid and minimally cooperative.  He has been not going to work for one week.  At times he appeared responding to internal stimuli.  After some encouragement he is willing to go back on Invega injection.  Associated Signs/Symptoms: Depression Symptoms:  depressed mood, anhedonia, insomnia, psychomotor retardation, fatigue, feelings of worthlessness/guilt, difficulty concentrating, hopelessness, anxiety, disturbed sleep, (Hypo) Manic Symptoms:  Elevated Mood, Impulsivity, Anxiety Symptoms:  Social Anxiety, Psychotic Symptoms:  Paranoia, Guarded and appears responding to internal stimuli PTSD Symptoms: NA Total Time spent with patient: 45 minutes  Past Psychiatric History: Patient was admitted to psychiatric hospital in March 2017 he was discharged in an vague and  Risperdal.  Patient was seeing [facility] but has been noncompliant for past few months.  Is the patient at risk to self? No.  Has the patient been a risk to self in the past 6 months? No.  Has the patient been a risk to self within the distant past? No.  Is the patient a risk to others? No.  Has the patient been a risk to others in the past 6 months? No.  Has the patient been a risk to others within the distant past? No.   Prior Inpatient Therapy: Prior Inpatient Therapy: Yes Prior Therapy Dates: 2017 Prior Therapy Facilty/Provider(s): [facility] Reason for Treatment: Bipolar with psychosis Prior Outpatient Therapy: Prior Outpatient Therapy: Yes Prior Therapy Dates: ongoing Prior Therapy Facilty/Provider(s): [facility] Reason for Treatment: Bipolar Does patient have an ACCT team?: Yes Does patient have Intensive In-House Services?  : No Does patient have [facility] services? : Yes Does patient have P4[facility] services?: No  Alcohol Screening: 1. How often do you have a drink containing alcohol?: Never 9. Have you or someone else been injured as a result of your drinking?: No 10. Has a relative or friend or a doctor or another health worker been concerned about your drinking or suggested you cut down?: No Alcohol Use Disorder Identification Test Final Score (AUDIT): 0 Brief Intervention: AUDIT score less than 7 or less-screening does not suggest unhealthy drinking-brief intervention not indicated Substance Abuse History in the last 12 months:  No. Consequences of Substance Abuse: Negative Previous Psychotropic Medications: Yes  Psychological Evaluations: No  Past Medical History: History reviewed. No pertinent past medical history. History reviewed. No pertinent surgical history. Family History:  Family History  Problem Relation Age of Onset  . Hyperlipidemia Father   . Diabetes Maternal Grandmother   . Alcohol abuse Paternal Grandfather  Family Psychiatric  History: Unknown Tobacco  Screening: Have you used any form of tobacco in the last 30 days? (Cigarettes, Smokeless Tobacco, Cigars, and/or Pipes): No Social History:  History  Alcohol Use  . 0.0 oz/week    Comment: Beer and Tequila on weekends.  3 beers and 4 shots of Tequila over 2 weekends per month     History  Drug Use  . Types: Marijuana    Comment: Marijuana:  starts and stops.  When smoking, daily.  Tested every 2 weeks due to taking prescription drug abuse--stopped in [place].  Goes throught 05/2016. Has used MJ since 6th grade.  Went to [employer]--smoked a lot daily.    Additional Social History: Marital status: Single    Pain Medications: see  MAR Prescriptions: see MAR Over the Counter: see MAR History of alcohol / drug use?: Yes Name of Substance 1: alcohol 1 - Age of First Use: n/a 1 - Amount (size/oz): "till tipsy" 1 - Frequency: n/a 1 - Duration: n/a 1 - Last Use / Amount: last Saturday Name of Substance 2: cannabis 2 - Age of First Use: n/a 2 - Amount (size/oz): n/a 2 - Frequency: once monthly 2 - Duration: n/a 2 - Last Use / Amount: 1 month ago                Allergies:  No Known Allergies Lab Results:  Results for orders placed or performed during the hospital encounter of 05/31/17 (from the past 48 hour(s))  TSH     Status: None   Collection Time: 06/01/17  6:20 AM  Result Value Ref Range   TSH 3.342 0.350 - 4.500 uIU/mL    Comment: Performed by a 3rd Generation assay with a functional sensitivity of <=0.01 uIU/mL. Performed at [facility], [address] [place], [place] [address]   Lipid panel     Status: Abnormal   Collection Time: 06/01/17  6:30 AM  Result Value Ref Range   Cholesterol 182 0 - 200 mg/dL   Triglycerides 75 <191<150 mg/dL   HDL 36 (L) >47>40 mg/dL   Total CHOL/HDL Ratio 5.1 RATIO   VLDL 15 0 - 40 mg/dL   LDL Cholesterol 829131 (H) 0 - 99 mg/dL    Comment:        Total Cholesterol/HDL:CHD Risk Coronary Heart Disease Risk Table                      Men   Women  1/2 Average Risk   3.4   3.3  Average Risk       5.0   4.4  2 X Average Risk   9.6   7.1  3 X Average Risk  23.4   11.0        Use the calculated Patient Ratio above and the CHD Risk Table to determine the patient's CHD Risk.        ATP III CLASSIFICATION (LDL):  <100     mg/dL   Optimal  562-130100-129  mg/dL   Near or Above                    Optimal  130-159  mg/dL   Borderline  [id number]  mg/dL   High  >696>190     mg/dL   Very High Performed at [facility], [address] [place], [place] [address]     Blood Alcohol level:  Lab Results  Component Value Date  ETH <5 03/15/2016    Metabolic Disorder Labs:  Lab Results  Component Value Date   HGBA1C 5.3 09/04/2016   Lab Results  Component Value Date   PROLACTIN 34.6 (H) 03/17/2016   Lab Results  Component Value Date   CHOL 182 06/01/2017   TRIG 75 06/01/2017   HDL 36 (L) 06/01/2017   CHOLHDL 5.1 06/01/2017   VLDL 15 06/01/2017   LDLCALC 131 (H) 06/01/2017   LDLCALC 106 03/01/2016    Current Medications: Current Facility-Administered Medications  Medication Dose Route Frequency Provider Last Rate Last Dose  . acetaminophen (TYLENOL) tablet 650 mg  650 mg Oral Q6H PRN Jackelyn Poling, NP      . alum & mag hydroxide-simeth (MAALOX/MYLANTA) 200-200-20 MG/5ML suspension 30 mL  30 mL Oral Q4H PRN Nira Conn A, NP      . hydrOXYzine (ATARAX/VISTARIL) tablet 25 mg  25 mg Oral TID PRN Nira Conn A, NP      . magnesium hydroxide (MILK OF MAGNESIA) suspension 30 mL  30 mL Oral Daily PRN Nira Conn A, NP      . paliperidone (INVEGA SUSTENNA) injection 234 mg  234 mg Intramuscular Q28 days Cleotis Nipper, MD   234 mg at 06/01/17 1404  . traZODone (DESYREL) tablet 50 mg  50 mg Oral QHS,MR X 1 Nira Conn A, NP       PTA Medications: Prescriptions Prior to Admission  Medication Sig Dispense Refill Last Dose  . paliperidone (INVEGA SUSTENNA) 234 MG/1.5ML SUSP injection Inject 234 mg into  the muscle every 28 (twenty-eight) days. (Patient not taking: Reported on 05/31/2017) 0.9 mL 1 Unknown  . [DISCONTINUED] fluconazole (DIFLUCAN) 150 MG tablet 1 tab by mouth daily for 3 days (Patient not taking: Reported on 05/31/2017) 3 tablet 0 Unknown    Musculoskeletal: Strength & Muscle Tone: within normal limits Gait & Station: Lying on his bed Patient leans: N/A  Psychiatric Specialty Exam: Physical Exam  Review of Systems  Constitutional: Negative.   HENT: Negative.   Respiratory: Negative.   Genitourinary: Negative.   Musculoskeletal: Negative.   Skin: Negative.     Blood pressure 135/78, pulse (!) 118, temperature 98.6 F (37 C), temperature source Oral, resp. rate 16, height 5\' 7"  (1.702 m), weight 77.1 kg (170 lb), SpO2 100 %.Body mass index is 26.63 kg/m.  General Appearance: Fairly Groomed and Guarded  Eye Contact:  Minimal  Speech:  Slow  Volume:  Decreased  Mood:  Dysphoric, Hopeless and Irritable  Affect:  Labile  Thought Process:  Descriptions of Associations: Circumstantial  Orientation:  Full (Time, Place, and Person)  Thought Content:  Paranoid Ideation and Rumination  Suicidal Thoughts:  No  Homicidal Thoughts:  No  Memory:  Immediate;   Fair Recent;   Fair Remote;   Fair  Judgement:  Fair  Insight:  Lacking  Psychomotor Activity:  Decreased  Concentration:  Concentration: Poor and Attention Span: Fair  Recall:  [employer] of Knowledge:  Fair  Language:  Fair  Akathisia:  No  Handed:  Right  AIMS (if indicated):     Assets:  Communication Skills Desire for Improvement Housing  ADL's:  Intact  Cognition:  WNL  Sleep:  Number of Hours: 5.25    Treatment Plan Summary: Daily contact with patient to assess and evaluate symptoms and progress in treatment and Medication management  Admit for crisis management and stabilization. Medication management to reduce symptoms to baseline and improved the patient's overall level of functioning.  We will  start Invega 234 mg intramuscular every 28 days.  Patient had a good response with injection.  Continue trazodone 50 mg for insomnia.  Continue Vistaril 25 mg 3 times a day for anxiety.  Closely monitor the side effects, efficacy and therapeutic response of medication. Treat health problem as indicated. Developed treatment plan to decrease the risk of relapse upon discharge and to reduce the need for readmission. Psychosocial education regarding relapse prevention in self-care. Healthcare followup as needed for medical problems and called consults as indicated.   Increase collateral information. Restart home medication where appropriate Encouraged to participate and verbalize into group milieu therapy.   Observation Level/Precautions:  15 minute checks  Laboratory:  CBC Chemistry Profile GGT HbAIC UDS  Psychotherapy:    Medications:    Consultations:    Discharge Concerns:    Estimated LOS:  Other:     Physician Treatment Plan for Primary Diagnosis: <principal problem not specified> Long Term Goal(s): Improvement in symptoms so as ready for discharge  Short Term Goals: Ability to identify changes in lifestyle to reduce recurrence of condition will improve, Ability to verbalize feelings will improve, Ability to demonstrate self-control will improve and Ability to identify and develop effective coping behaviors will improve  Physician Treatment Plan for Secondary Diagnosis: Active Problems:   Bipolar disorder with psychotic features (HCC)  Long Term Goal(s): Improvement in symptoms so as ready for discharge  Short Term Goals: Ability to verbalize feelings will improve, Ability to identify and develop effective coping behaviors will improve, Ability to maintain clinical measurements within normal limits will improve, Compliance with prescribed medications will improve and Ability to identify triggers associated with substance abuse/mental health issues will improve  I certify that  inpatient services furnished can reasonably be expected to improve the patient's condition.    Zera Markwardt T., MD 6/16/20185:05 PM

## 2017-06-01 NOTE — Progress Notes (Signed)
D: Pt spent majority of night isolated to his room , pt forwarded little information this evening, pt came out and gave his urine sample and asked how long he was going to be here, pt was encouraged to speak with the doctor about that but he was informed the average length of stay was 3-7 days. Pt had a hard time sleeping this evening even though he refused his sleep medication, pt was noted , per 15 min check sheet , sleeping 6.75 hrs during the day .   A: Pt was offered support and encouragement.  Q 15 minute checks were done for safety.  R: safety maintained on unit.

## 2017-06-01 NOTE — Progress Notes (Signed)
Patient awakened for Gean BirchwoodInvega Sustenna injection - patient has slept entire shift except for meals and meeting with MD. Patient came voluntarily up to medication window, but then asked "do I have to take it?" Explained to patient that it was definitely the recommended therapy but that he did have the right to refuse. Patient stated "I refuse it, there's nothing wrong with me, just my parents think there's something wrong with me". RN asked if not taking his medications was something that his parents were worried about. He replied yes, and then agreed to take injection. Injection given in right deltoid and patient returned to bed.

## 2017-06-02 LAB — HEMOGLOBIN A1C
Hgb A1c MFr Bld: 5.4 % (ref 4.8–5.6)
Mean Plasma Glucose: 108 mg/dL

## 2017-06-02 LAB — PROLACTIN: Prolactin: 31.5 ng/mL — ABNORMAL HIGH (ref 4.0–15.2)

## 2017-06-02 LAB — RAPID URINE DRUG SCREEN, HOSP PERFORMED
AMPHETAMINES: NOT DETECTED
Barbiturates: NOT DETECTED
Benzodiazepines: NOT DETECTED
COCAINE: NOT DETECTED
OPIATES: NOT DETECTED
TETRAHYDROCANNABINOL: NOT DETECTED

## 2017-06-02 NOTE — Progress Notes (Signed)
[facility] MD Progress Note  06/02/2017 1:14 PM Todd [name] Todd [name]  MRN:  [id number] Subjective:  I am tired. Objective; patient seen chart reviewed. Patient was admitted due to having manic episodes and psychosis.  He was noncompliant with medication.  We started him on Invega.  He endorse poor sleep and feeling tired but denies any suicidal thoughts. He continues to minimize his symptoms but at times seemed responding to internal stimuli.  He does not go to the groups.  He stays to himself and had limited artist patient in the unit.  However he is not disruptive or aggressive.  He has no tremors or shakes. His thought processes remain circumstantial and he continued to endorse sadness, depression and at times feeling of hopelessness.  Principal Problem: Bipolar disorder with psychotic features [facility]) Diagnosis:   Patient Active Problem List   Diagnosis Date Noted  . Bipolar disorder with psychotic features (HCC) [F31.9] 05/31/2017  . Tobacco use disorder [F17.200] 03/19/2016  . Cannabis use disorder, mild, abuse [F12.10] 03/18/2016  . Bipolar I disorder, single manic episode, severe, with psychosis (HCC) [F30.2] 03/16/2016   Total Time spent with patient: 20 minutes  Past Psychiatric History: reviewed.  Past Medical History: History reviewed. No pertinent past medical history. History reviewed. No pertinent surgical history. Family History:  Family History  Problem Relation Age of Onset  . Hyperlipidemia Father   . Diabetes Maternal Grandmother   . Alcohol abuse Paternal Grandfather    Family Psychiatric  History: reviewed. Social History:  History  Alcohol Use  . 0.0 oz/week    Comment: Beer and Tequila on weekends.  3 beers and 4 shots of Tequila over 2 weekends per month     History  Drug Use  . Types: Marijuana    Comment: Marijuana:  starts and stops.  When smoking, daily.  Tested every 2 weeks due to taking prescription drug abuse--stopped in [place].  Goes  throught 05/2016. Has used MJ since 6th grade.  Went to [employer] a lot daily.    Social History   Social History  . Marital status: Single    Spouse name: N/A  . Number of children: 0  . 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 Smoking status: Former Smoker    Packs/day: 0.00  . Smokeless tobacco: Never Used  . Alcohol use 0.0 oz/week     Comment: Beer and Tequila on weekends.  3 beers and 4 shots of Tequila over 2 weekends per month  . Drug use: Yes    Types: Marijuana     Comment: Marijuana:  starts and stops.  When smoking, daily.  Tested every 2 weeks due to taking prescription drug abuse--stopped in [place].  Goes throught 05/2016. Has used MJ since 6th grade.  Went to [employer] a lot daily.  Marland Kitchen Sexual activity: Yes    Partners: Female    Birth control/ protection: Condom   Other Topics Concern  . None   Social History Narrative   Originally from [place]   Came to [employer]. When 21 years old.     Lives with parents and 58 yo sister.    Additional Social History:    Pain Medications: see  MAR Prescriptions: see MAR Over the Counter: see MAR History of alcohol / drug use?: Yes Name of Substance 1: alcohol 1 - Age of First Use: n/a 1 - Amount (size/oz): "till  tipsy" 1 - Frequency: n/a 1 - Duration: n/a 1 - Last Use / Amount: last Saturday Name of Substance 2: cannabis 2 - Age of First Use: n/a 2 - Amount (size/oz): n/a 2 - Frequency: once monthly 2 - Duration: n/a 2 - Last Use / Amount: 1 month ago                Sleep: Fair  Appetite:  Fair  Current Medications: Current Facility-Administered Medications  Medication Dose Route Frequency Provider Last Rate Last Dose  . acetaminophen (TYLENOL) tablet 650 mg  650 mg Oral Q6H PRN Jackelyn Poling, NP      . alum & mag hydroxide-simeth (MAALOX/MYLANTA) 200-200-20 MG/5ML suspension 30 mL  30 mL Oral Q4H PRN Nira Conn  A, NP      . hydrOXYzine (ATARAX/VISTARIL) tablet 25 mg  25 mg Oral TID PRN Nira Conn A, NP      . magnesium hydroxide (MILK OF MAGNESIA) suspension 30 mL  30 mL Oral Daily PRN Nira Conn A, NP      . paliperidone (INVEGA SUSTENNA) injection 234 mg  234 mg Intramuscular Q28 days Cleotis Nipper, MD   234 mg at 06/01/17 1404  . traZODone (DESYREL) tablet 50 mg  50 mg Oral QHS,MR X 1 Jackelyn Poling, NP        Lab Results:  Results for orders placed or performed during the hospital encounter of 05/31/17 (from the past 48 hour(s))  TSH     Status: None   Collection Time: 06/01/17  6:20 AM  Result Value Ref Range   TSH 3.342 0.350 - 4.500 uIU/mL    Comment: Performed by a 3rd Generation assay with a functional sensitivity of <=0.01 uIU/mL. Performed at [facility], [address], [place], [place] [address]   Lipid panel     Status: Abnormal   Collection Time: 06/01/17  6:30 AM  Result Value Ref Range   Cholesterol 182 0 - 200 mg/dL   Triglycerides 75 <191 mg/dL   HDL 36 (L) >47 mg/dL   Total CHOL/HDL Ratio 5.1 RATIO   VLDL 15 0 - 40 mg/dL   LDL Cholesterol 829 (H) 0 - 99 mg/dL    Comment:        Total Cholesterol/HDL:CHD Risk Coronary Heart Disease Risk Table                     Men   Women  1/2 Average Risk   3.4   3.3  Average Risk       5.0   4.4  2 X Average Risk   9.6   7.1  3 X Average Risk  23.4   11.0        Use the calculated Patient Ratio above and the CHD Risk Table to determine the patient's CHD Risk.        ATP III CLASSIFICATION (LDL):  <100     mg/dL   Optimal  562-130  mg/dL   Near or Above                    Optimal  130-159  mg/dL   Borderline  865-784  mg/dL   High  >696     mg/dL   Very High Performed at [facility], [address], [place], [place] [address]   Prolactin     Status: Abnormal   Collection Time: 06/01/17  6:30 AM  Result Value Ref  Range   Prolactin 31.5 (H) 4.0 - 15.2 ng/mL    Comment: (NOTE) Performed  At: [facility] [address] [place], [place] [id number] Mila HomerHancock William F MD WU:[id number]:[phone] Performed at [facility], [address] [place], [place] [address]   Rapid urine drug screen (hospital performed)     Status: None   Collection Time: 06/01/17  5:14 PM  Result Value Ref Range   Opiates NONE DETECTED NONE DETECTED   Cocaine NONE DETECTED NONE DETECTED   Benzodiazepines NONE DETECTED NONE DETECTED   Amphetamines NONE DETECTED NONE DETECTED   Tetrahydrocannabinol NONE DETECTED NONE DETECTED   Barbiturates NONE DETECTED NONE DETECTED    Comment:        DRUG SCREEN FOR MEDICAL PURPOSES ONLY.  IF CONFIRMATION IS NEEDED FOR ANY PURPOSE, NOTIFY LAB WITHIN 5 DAYS.        LOWEST DETECTABLE LIMITS FOR URINE DRUG SCREEN Drug Class       Cutoff (ng/mL) Amphetamine      1000 Barbiturate      200 Benzodiazepine   200 Tricyclics       300 Opiates          300 Cocaine          300 THC              50 Performed at [facility], [address] [place], [place] [address]     Blood Alcohol level:  Lab Results  Component Value Date   ETH <5 03/15/2016    Metabolic Disorder Labs: Lab Results  Component Value Date   HGBA1C 5.3 09/04/2016   Lab Results  Component Value Date   PROLACTIN 31.5 (H) 06/01/2017   PROLACTIN 34.6 (H) 03/17/2016   Lab Results  Component Value Date   CHOL 182 06/01/2017   TRIG 75 06/01/2017   HDL 36 (L) 06/01/2017   CHOLHDL 5.1 06/01/2017   VLDL 15 06/01/2017   LDLCALC 131 (H) 06/01/2017   LDLCALC 106 03/01/2016    Physical Findings: AIMS: Facial and Oral Movements Muscles of Facial Expression: None, normal Lips and Perioral Area: None, normal Jaw: None, normal Tongue: None, normal,Extremity Movements Upper (arms, wrists, hands, fingers): None, normal Lower (legs, knees, ankles, toes): None, normal, Trunk Movements Neck, shoulders, hips: None, normal, Overall Severity Severity of abnormal  movements (highest score from questions above): None, normal Incapacitation due to abnormal movements: None, normal Patient's awareness of abnormal movements (rate only patient's report): No Awareness, Dental Status Current problems with teeth and/or dentures?: No Does patient usually wear dentures?: No  CIWA:    COWS:     Musculoskeletal: Strength & Muscle Tone: within normal limits Gait & Station: normal Patient leans: N/A  Psychiatric Specialty Exam: Physical Exam  Review of Systems  Constitutional: Negative.   HENT: Negative.   Respiratory: Negative.   Gastrointestinal: Negative.   Genitourinary: Negative.   Musculoskeletal: Negative.   Neurological: Negative.   Psychiatric/Behavioral: Positive for depression.    Blood pressure 118/84, pulse 74, temperature 98.3 F (36.8 C), temperature source Oral, resp. rate 16, height 5\' 7"  (1.702 m), weight 77.1 kg (170 lb), SpO2 100 %.Body mass index is 26.63 kg/m.  General Appearance: Casual and Guarded  Eye Contact:  Fair  Speech:  Slow  Volume:  Decreased  Mood:  Depressed and Dysphoric  Affect:  Constricted and Depressed  Thought Process:  Descriptions of Associations: Circumstantial  Orientation:  Full (Time, Place, and Person)  Thought Content:  Paranoid Ideation  and Rumination  Suicidal Thoughts:  No  Homicidal Thoughts:  No  Memory:  Immediate;   Fair Recent;   Fair Remote;   Fair  Judgement:  Fair  Insight:  Fair  Psychomotor Activity:  Decreased  Concentration:  Concentration: Fair and Attention Span: Fair  Recall:  Good  Fund of Knowledge:  Good  Language:  Good  Akathisia:  No  Handed:  Right  AIMS (if indicated):     Assets:  Communication Skills Desire for Improvement Housing  ADL's:  Intact  Cognition:  WNL  Sleep:  Number of Hours: 4.5     Treatment Plan Summary: Daily contact with patient to assess and evaluate symptoms and progress in treatment and Medication management  Patient was given  Invega Inj yesterday.  He tolerated well and denies any side effects.  Continue trazodone 50 mg at bedtime for insomnia and hydroxyzine 25 mg as needed for anxiety.  Encouraged to participate in group minute therapy.  Increase collateral information.  Social worker start discharge planning. Review blood work results.  He talks negative. Prolactin level 31.5. Lipid panel shows HDL 36 and LDL 131.  TSH and hemoglobin A1c normal. BASIC metabolic panel normal.   Dailey Buccheri T., MD 06/02/2017, 1:14 PM

## 2017-06-02 NOTE — Progress Notes (Signed)
D: Patient's self inventory sheet: patient has good sleep, received sleep medication.good  Appetite, normal energy level, good concentration. Rated depression 0/10, hopeless 0/10, anxiety 3/10. SI/HI/AVH: denies all. Physical complaints are pain in hands and feet. Goal is "my goals". Plans to work on "work be helpful".   A: Medications administered, assessed medication knowledge and education given on medication regimen.  Emotional support and encouragement given patient. R: Denies SI and HI , contracts for safety. Safety maintained with 15 minute checks.
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
## 2017-06-02 NOTE — [facility] Group Notes (Signed)
[facility] LCSW Group Therapy  06/02/2017 11 AM  Type of Therapy:  Group Therapy  Participation Level:  Minimal; as patient was only present for 5 minutes    Carney Bernatherine C Cori Justus, LCSW

## 2017-06-02 NOTE — Progress Notes (Signed)
D: Hamish paces the unit. Minimal conversation. Denies pain, SI/HI, AH/VH at this time. Verbalizes no concern. No behavior issues noted.  A: Staff offered support and encouragement as needed. Due med given as ordered. Routine safety checks maintained. Will continue to monitor patient.  R: Patient is safe on unit.

## 2017-06-02 NOTE — [facility] Suicide Risk Assessment (Signed)
[facility] INPATIENT:  Family/Significant Other Suicide Prevention Education  Suicide Prevention Education:  Patient Refusal for Family/Significant Other Suicide Prevention Education: The patient Todd [name] has refused to provide written consent for family/significant other to be provided Family/Significant Other Suicide Prevention Education during admission and/or prior to discharge.  Physician notified.  Writer provided suicide prevention education directly to patient; conversation included risk factors, warning signs and resources to contact for help. Mobile crisis services explained and  explanations given as to resources which will be included on patient's discharge paperwork.   Carney BernCatherine C Harrill 06/02/2017, 6:25 PM

## 2017-06-03 DIAGNOSIS — F312 Bipolar disorder, current episode manic severe with psychotic features: Principal | ICD-10-CM | POA: Diagnosis present

## 2017-06-03 DIAGNOSIS — F1290 Cannabis use, unspecified, uncomplicated: Secondary | ICD-10-CM

## 2017-06-03 DIAGNOSIS — Z87891 Personal history of nicotine dependence: Secondary | ICD-10-CM

## 2017-06-03 NOTE — Progress Notes (Signed)
Adult Psychoeducational Group Note  Date:  06/03/2017 Time:  9:01 PM  Group Topic/Focus:  Wrap-Up Group:   The focus of this group is to help patients review their daily goal of treatment and discuss progress on daily workbooks.  Participation Level:  Did Not Attend  Participation Quality:  Did not attend  Affect:  Did not attend  Cognitive:  Did not attend  Insight: None  Engagement in Group:  Did not attend  Modes of Intervention:  Did not attend  Additional Comments:  Patient did not attend wrap up group tonight.  Doaa Kendzierski L Edyn Popoca 06/03/2017, 9:01 PM

## 2017-06-03 NOTE — Progress Notes (Signed)
Patient 's parents visited at the beginning of the shift. He has been pacing the hall way since his parent left. Patient did not attend group, he stated " I'M good when asked to go to group". He appeared guarded and seemed irritable. He denied SI/HI and denied hallucinations.Clinical research associatewriter encouraged and supported patient. Q 15 minute checks continue for safety.

## 2017-06-03 NOTE — Progress Notes (Signed)
Patient ID: Todd [name], male   DOB: 10/11/1996, 21 y.o.   MRN: [id number]  DAR: Pt. Denies SI/HI and A/V Hallucinations. He reports sleep is good, appetite is good, energy level is normal, and concentration is good. He rates depression 3/10, hopelessness 5/10, and anxiety 2/10. Patient does not report any pain or discomfort at this time. Support and encouragement provided to the patient. No scheduled medications to administer to patient at this time. Patient is seen in the milieu intermittently pacing. He does present with some thought blocking and this afternoon appeared paranoid. He reported, "I want to move rooms. My roommate is giving me bad vibes." Patient was moved to another room in order to decrease his anxiety. Q15 minute checks are maintained for safety.

## 2017-06-03 NOTE — Progress Notes (Signed)
[facility]  06/03/2017 2:42 PM Todd [name]  MRN:  [id number] Subjective:  Pt states " I am ok. I still have mood swings. But I want my medications to stay the same for now."  Objective;Patient seen and chart reviewed.Discussed patient with treatment team.  Pt today seen in bed , avoids eye contact , continues to attempt to minimize symptoms. Does report mood lability. Pt at times noted as responding to internal stimuli. Pt denies any other issues. Pt continues to need support.    Principal Problem: Severe manic bipolar 1 disorder with psychotic behavior (HCC) Diagnosis:   Patient Active Problem List   Diagnosis Date Noted  . Severe manic bipolar 1 disorder with psychotic behavior (HCC) [F31.2] 06/03/2017  . Tobacco use disorder [F17.200] 03/19/2016  . Cannabis use disorder, mild, abuse [F12.10] 03/18/2016   Total Time spent with patient: 20 minutes  Past Psychiatric History: reviewed.  Past Medical History: Please see H&P.  Family History:  Family History  Problem Relation Age of Onset  . Hyperlipidemia Father   . Diabetes Maternal Grandmother   . Alcohol abuse Paternal Grandfather    Family Psychiatric  History: reviewed. Social History:  History  Alcohol Use  . 0.0 oz/week    Comment: Beer and Tequila on weekends.  3 beers and 4 shots of Tequila over 2 weekends per month     History  Drug Use  . Types: Marijuana    Comment: Marijuana:  starts and stops.  When smoking, daily.  Tested every 2 weeks due to taking prescription drug abuse--[place] in [place].  Goes throught 05/2016. Has used MJ since 6th grade.  Went to [employer]--smoked a lot daily.    Social History   Social History  . Marital status: Single    Spouse name: N/A  . Number of children: 0  . 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 Smoking status: Former Smoker    Packs/day: 0.00  .  Smokeless tobacco: Never Used  . Alcohol use 0.0 oz/week     Comment: Beer and Tequila on weekends.  3 beers and 4 shots of Tequila over 2 weekends per month  . Drug use: Yes    Types: Marijuana     Comment: Marijuana:  starts and stops.  When smoking, daily.  Tested every 2 weeks due to taking prescription drug abuse--sto[place] in [place].  Goes throught 05/2016. Has used MJ since 6th grade.  Went to [employer]--smoked a lot daily.  Marland Kitchen. Sexual activity: Yes    Partners: Female    Birth control/ protection: Condom   Other Topics Concern  . None   Social History Narrative   [place]   [place] 21 [place]     Lives with parents and 311 yo sister.    Additional Social History:    Pain Medications: see  MAR Prescriptions: see MAR Over the Counter: see MAR History of alcohol / drug use?: Yes Name of Substance 1: alcohol 1 - Age of First Use: n/a 1 - Amount (size/oz): "till tipsy" 1 - Frequency: n/a 1 - Duration: n/a 1 - Last Use / Amount: last Saturday Name of Substance 2: cannabis 2 - Age of First Use: n/a 2 - Amount (size/oz): n/a 2 - Frequency: once monthly 2 - Duration: n/a 2 - Last Use / Amount: 1 month ago  Sleep: Fair  Appetite:  Fair  Current Medications: Current Facility-Administered Medications  Medication Dose Route Frequency Provider Last Rate Last Dose  . acetaminophen (TYLENOL) tablet 650 mg  650 mg Oral Q6H PRN Nira Conn A, NP   650 mg at 06/02/17 1408  . alum & mag hydroxide-simeth (MAALOX/MYLANTA) 200-200-20 MG/5ML suspension 30 mL  30 mL Oral Q4H PRN Nira Conn A, NP      . hydrOXYzine (ATARAX/VISTARIL) tablet 25 mg  25 mg Oral TID PRN Jackelyn Poling, NP   25 mg at 06/02/17 2126  . magnesium hydroxide (MILK OF MAGNESIA) suspension 30 mL  30 mL Oral Daily PRN Nira Conn A, NP      . paliperidone (INVEGA SUSTENNA) injection 234 mg  234 mg Intramuscular Q28 days Cleotis Nipper, MD   234 mg at 06/01/17 1404   . traZODone (DESYREL) tablet 50 mg  50 mg Oral QHS,MR X 1 Nira Conn A, NP   50 mg at 06/02/17 2126    Lab Results:  Results for orders placed or performed during the hospital encounter of 05/31/17 (from the past 48 hour(s))  Rapid urine drug screen (hospital performed)     Status: None   Collection Time: 06/01/17  5:14 PM  Result Value Ref Range   Opiates NONE DETECTED NONE DETECTED   Cocaine NONE DETECTED NONE DETECTED   Benzodiazepines NONE DETECTED NONE DETECTED   Amphetamines NONE DETECTED NONE DETECTED   Tetrahydrocannabinol NONE DETECTED NONE DETECTED   Barbiturates NONE DETECTED NONE DETECTED    Comment:        DRUG SCREEN FOR MEDICAL PURPOSES ONLY.  IF CONFIRMATION IS NEEDED FOR ANY PURPOSE, NOTIFY LAB WITHIN 5 DAYS.        LOWEST DETECTABLE LIMITS FOR URINE DRUG SCREEN Drug Class       Cutoff (ng/mL) Amphetamine      1000 Barbiturate      200 Benzodiazepine   200 Tricyclics       300 Opiates          300 Cocaine          300 THC              50 Performed at [facility], [address], [place], [place] [address]     Blood Alcohol level:  Lab Results  Component Value Date   ETH <5 03/15/2016    Metabolic Disorder Labs: Lab Results  Component Value Date   HGBA1C 5.4 06/01/2017   MPG 108 06/01/2017   Lab Results  Component Value Date   PROLACTIN 31.5 (H) 06/01/2017   PROLACTIN 34.6 (H) 03/17/2016   Lab Results  Component Value Date   CHOL 182 06/01/2017   TRIG 75 06/01/2017   HDL 36 (L) 06/01/2017   CHOLHDL 5.1 06/01/2017   VLDL 15 06/01/2017   LDLCALC 131 (H) 06/01/2017   LDLCALC 106 03/01/2016    Physical Findings: AIMS: Facial and Oral Movements Muscles of Facial Expression: None, normal Lips and Perioral Area: None, normal Jaw: None, normal Tongue: None, normal,Extremity Movements Upper (arms, wrists, hands, fingers): None, normal Lower (legs, knees, ankles, toes): None, normal, Trunk Movements Neck, shoulders,  hips: None, normal, Overall Severity Severity of abnormal movements (highest score from questions above): None, normal Incapacitation due to abnormal movements: None, normal Patient's awareness of abnormal movements (rate only patient's report): No Awareness, Dental Status Current problems with teeth and/or dentures?: No Does patient usually wear dentures?: No  CIWA:  COWS:     Musculoskeletal: Strength & Muscle Tone: within normal limits Gait & Station: normal Patient leans: N/A  Psychiatric Specialty Exam: Physical Exam  Nursing note and vitals reviewed.   Review of Systems  Psychiatric/Behavioral: Positive for depression. The patient is nervous/anxious.   All other systems reviewed and are negative.   Blood pressure 102/64, pulse (!) 108, temperature 98.6 F (37 C), temperature source Oral, resp. rate 20, height 5\' 7"  (1.702 m), weight 77.1 kg (170 lb), SpO2 100 %.Body mass index is 26.63 kg/m.  General Appearance: Casual and Guarded  Eye Contact:  Minimal  Speech:  Slow  Volume:  Decreased  Mood:  Depressed and Dysphoric  Affect:  Depressed  Thought Process:  Goal Directed and Descriptions of Associations: Circumstantial  Orientation:  Full (Time, Place, and Person)  Thought Content:  Paranoid Ideation and Rumination  Suicidal Thoughts:  No  Homicidal Thoughts:  No  Memory:  Immediate;   Fair Recent;   Fair Remote;   Fair  Judgement:  Impaired  Insight:  Fair  Psychomotor Activity:  Decreased  Concentration:  Concentration: Fair and Attention Span: Fair  Recall:  Good  Fund of Knowledge:  Fair  Language:  Fair  Akathisia:  No  Handed:  Right  AIMS (if indicated):     Assets:  Communication Skills Desire for Improvement Housing  ADL's:  Intact  Cognition:  WNL  Sleep:  Number of Hours: 6.75   Patient with bipolar do , currently continues to be psychotic and has mood sx. Pt will benefit from continued stay.   Treatment Plan Summary: Daily contact with  patient to assess and evaluate symptoms and progress in treatment, Medication management and Plan see below    Reviewed past medical records,treatment plan.  Will continue to monitor vitals ,medication compliance and treatment side effects while patient is here.  Invega Sustenna 234 mg IM - q28 days - last dose 05/31/2017. Repeat Q28 days. Trazodone 50 mg po qhs for sleep. Will monitor for medical issues as well as call consult as needed.  Reviewed labs PL elevated - will monitor .  CSW will continue working on disposition.  Patient to participate in therapeutic milieu .         Delynn Pursley, MD 06/03/2017, 2:42 PM

## 2017-06-03 NOTE — [facility] Group Notes (Signed)
[facility] LCSW Group Therapy   06/03/2017 1pm Type of Therapy: Group Therapy   Participation Level: Patient invited but did not attend.   Todd AbbotKadijah Rahi [name], MSW, LCSW-A 06/03/2017, 2:28PM

## 2017-06-03 NOTE — Progress Notes (Signed)
Recreation Therapy Notes  INPATIENT RECREATION THERAPY ASSESSMENT  Patient Details Name: Chane Francisco Hernandez Avalos MRN: [id number] DOB: 08/10/1996 Today's Date: 6/18/[address]  Patient Stressors: Other (Comment) (Think a lot)  Pt stated he was here because he wasn't acting normal.  Coping Skills:   Isolate, Avoidance, Exercise, Talking, Music, Sports  Personal Challenges: Communication, Concentration, Decision-Making, Expressing Yourself, Problem-Solving, Relationships, Self-Esteem/Confidence, Social Interaction, Stress Management, Time Management, Trusting Others, Work Nutritional therapisterformance  Leisure Interests (2+):  Sports - Other (Comment) (Soccer)  Awareness of Community Resources:  No  Patient Strengths:  Smart, happy  Patient Identified Areas of Improvement:  Not thinking a lot; concentrate  Current Recreation Participation:  1-2 times a week  Patient Goal for Hospitalization:  "To be good/well"  [place]  [place]  Current [place] (including self-harm):  No  Current HI:  No  Consent to Intern Participation: N/A   Caroll RancherMarjette Torii Royse, LRT/CTRS  Caroll RancherLindsay, Elysabeth Aust A 06/03/2017, 3:18 PM

## 2017-06-03 NOTE — Tx Team (Signed)
Interdisciplinary Treatment and Diagnostic Plan Update  06/03/2017 Time of Session: 9:00 AM [address] Todd [name] MRN: [id number]  Principal Diagnosis: Bipolar disorder with psychotic features [facility])  Secondary Diagnoses: Principal Problem:   Bipolar disorder with psychotic features (HCC)   Current Medications:  Current Facility-Administered Medications  Medication Dose Route Frequency Provider Last Rate Last Dose  . acetaminophen (TYLENOL) tablet 650 mg  650 mg Oral Q6H PRN Nira Conn A, NP   650 mg at 06/02/17 1408  . alum & mag hydroxide-simeth (MAALOX/MYLANTA) 200-200-20 MG/5ML suspension 30 mL  30 mL Oral Q4H PRN Nira Conn A, NP      . hydrOXYzine (ATARAX/VISTARIL) tablet 25 mg  25 mg Oral TID PRN Jackelyn Poling, NP   25 mg at 06/02/17 2126  . magnesium hydroxide (MILK OF MAGNESIA) suspension 30 mL  30 mL Oral Daily PRN Nira Conn A, NP      . paliperidone (INVEGA SUSTENNA) injection 234 mg  234 mg Intramuscular Q28 days Cleotis Nipper, MD   234 mg at 06/01/17 1404  . traZODone (DESYREL) tablet 50 mg  50 mg Oral QHS,MR X 1 Nira Conn A, NP   50 mg at 06/02/17 2126   PTA Medications: Prescriptions Prior to Admission  Medication Sig Dispense Refill Last Dose  . paliperidone (INVEGA SUSTENNA) 234 MG/1.5ML SUSP injection Inject 234 mg into the muscle every 28 (twenty-eight) days. (Patient not taking: Reported on 05/31/2017) 0.9 mL 1 Unknown  . [DISCONTINUED] fluconazole (DIFLUCAN) 150 MG tablet 1 tab by mouth daily for 3 days (Patient not taking: Reported on 05/31/2017) 3 tablet 0 Unknown    Patient Stressors: Medication change or noncompliance  Patient Strengths: Average or above average intelligence Communication skills Supportive family/friends  Treatment Modalities: Medication Management, Group therapy, Case management,  1 to 1 session with clinician, Psychoeducation, Recreational therapy.   Physician Treatment Plan for Primary Diagnosis: Bipolar disorder  with psychotic features (HCC) Long Term Goal(s): Improvement in symptoms so as ready for discharge Improvement in symptoms so as ready for discharge   Short Term Goals: Ability to identify changes in lifestyle to reduce recurrence of condition will improve Ability to verbalize feelings will improve Ability to demonstrate self-control will improve Ability to identify and develop effective coping behaviors will improve Ability to verbalize feelings will improve Ability to identify and develop effective coping behaviors will improve Ability to maintain clinical measurements within normal limits will improve Compliance with prescribed medications will improve Ability to identify triggers associated with substance abuse/mental health issues will improve  Medication Management: Evaluate patient's response, side effects, and tolerance of medication regimen.  Therapeutic Interventions: 1 to 1 sessions, Unit Group sessions and Medication administration.  Evaluation of Outcomes: Progressing  Physician Treatment Plan for Secondary Diagnosis: Principal Problem:   Bipolar disorder with psychotic features (HCC)  Long Term Goal(s): Improvement in symptoms so as ready for discharge Improvement in symptoms so as ready for discharge   Short Term Goals: Ability to identify changes in lifestyle to reduce recurrence of condition will improve Ability to verbalize feelings will improve Ability to demonstrate self-control will improve Ability to identify and develop effective coping behaviors will improve Ability to verbalize feelings will improve Ability to identify and develop effective coping behaviors will improve Ability to maintain clinical measurements within normal limits will improve Compliance with prescribed medications will improve Ability to identify triggers associated with substance abuse/mental health issues will improve     Medication Management: Evaluate patient's response, side  effects, and  tolerance of medication regimen.  Therapeutic Interventions: 1 to 1 sessions, Unit Group sessions and Medication administration.  Evaluation of Outcomes: Progressing   RN Treatment Plan for Primary Diagnosis: Bipolar disorder with psychotic features (HCC) Long Term Goal(s): Knowledge of disease and therapeutic regimen to maintain health will improve  Short Term Goals: Ability to remain free from injury will improve, Ability to verbalize feelings will improve, Ability to disclose and discuss suicidal ideas and Compliance with prescribed medications will improve  Medication Management: RN will administer medications as ordered by provider, will assess and evaluate patient's response and provide education to patient for prescribed medication. RN will report any adverse and/or side effects to prescribing provider.  Therapeutic Interventions: 1 on 1 counseling sessions, Psychoeducation, Medication administration, Evaluate responses to treatment, Monitor vital signs and CBGs as ordered, Perform/monitor CIWA, COWS, AIMS and Fall Risk screenings as ordered, Perform wound care treatments as ordered.  Evaluation of Outcomes: Progressing   LCSW Treatment Plan for Primary Diagnosis: Bipolar disorder with psychotic features [facility](HCC) Long Term Goal(s): Safe transition to appropriate next level of care at discharge, Engage patient in therapeutic group addressing interpersonal concerns.  Short Term Goals: Engage patient in aftercare planning with referrals and resources, Increase social support and Increase emotional regulation  Therapeutic Interventions: Assess for all discharge needs, 1 to 1 time with Social worker, Explore available resources and support systems, Assess for adequacy in community support network, Educate family and significant other(s) on suicide prevention, Complete Psychosocial Assessment, Interpersonal group therapy.  Evaluation of Outcomes: Progressing   Progress in  Treatment: Attending groups: No. Participating in groups: No. Taking medication as prescribed: Yes. Toleration medication: Yes. Family/Significant other contact made: No, will contact:  Pt refused, CSW will reassess Patient understands diagnosis: Yes. Discussing patient identified problems/goals with staff: Yes. Medical problems stabilized or resolved: Yes. Denies suicidal/homicidal ideation: No. Issues/concerns per patient self-inventory: No.  New problem(s) identified: No, Describe:  None identified.  New Short Term/Long Term Goal(s): Patient stated that his goal is to "feel better."  Discharge Plan or Barriers: CSW assessing appropriate discharge plan.  Reason for Continuation of Hospitalization: Mania Medication stabilization  Estimated Length of Stay: 2-4 days  Attendees: Patient: Todd GamerJuan Hernandez [name] 06/03/2017 12:53 PM  Physician: Dr. Elna BreslowEappen 06/03/2017 12:53 PM  Nursing: Marzetta Boardhrista Dopson, RN  06/03/2017 12:53 PM  RN Care Manager: 06/03/2017 12:53 PM  Social Worker: Hampton AbbotKadijah Wilmont Olund, MSW, LCSW-A 06/03/2017 12:53 PM  Recreational Therapist:  06/03/2017 12:53 PM  Other:  06/03/2017 12:53 PM  Other:  06/03/2017 12:53 PM  Other: 06/03/2017 12:53 PM    Scribe for Treatment Team: Lynden OxfordKadijah R Srihan Brutus, LCSWA 06/03/2017 12:53 PM

## 2017-06-03 NOTE — Progress Notes (Signed)
Recreation Therapy Notes  Date: 06/03/17 Time: 1000 Location: [address] Dayroom  Group Topic: Coping Skills  Goal Area(s) Addresses:  Patients will be able to identify positive coping skills. Patients will be able to identify the benefits of using positive coping skills. Patients will be able to identify the benefits of using coping skills post d/c.  Intervention: OrthoptistWeb design, pencils  Activity: OrthoptistWeb Design.  Patients were given a worksheet of a blank spider web.  Patients were to identify the things that have gotten them stuck and brought them into the hospital.  Patients were to then identify coping skills they can use to help deal with these situations and put them outside the web.  Education:Coping Skills, Discharge Planning.   Education Outcome: Acknowledges understanding/In group clarification offered/Needs additional education.   Clinical Observations/Feedback: Pt did not attend group.   Caroll RancherMarjette Rosy Estabrook, LRT/CTRS         Lillia AbedLindsay, Rigoberto Repass A 06/03/2017 1:23 PM

## 2017-06-03 NOTE — Progress Notes (Signed)
Adult Psychoeducational Group Note  Date:  06/03/2017 Time:  1:55 AM  Group Topic/Focus:  Wrap-Up Group:   The focus of this group is to help patients review their daily goal of treatment and discuss progress on daily workbooks.  Participation Level:  Minimal  Participation Quality:  Appropriate  Affect:  Appropriate  Cognitive:  Appropriate  Insight: Appropriate  Engagement in Group:  Engaged  Modes of Intervention:  Discussion  Additional Comments:  Patient stated his goal for today was to take treatment serious so he could get back home to his family. Patient rated the day a 5 out 10. Patient stated something positive that happened today was his family came to visit him today and the visit went well. Patient stated tomorrow goal is to interact more with his peers.  Todd FurnaceChristopher  Vaniyah [name] 06/03/2017, 1:55 AM

## 2017-06-04 MED ORDER — CITALOPRAM HYDROBROMIDE 10 MG PO TABS
5.0000 mg | ORAL_TABLET | Freq: Every day | ORAL | Status: DC
  Administered 2017-06-04 – 2017-06-07 (×4): 5 mg via ORAL
  Filled 2017-06-04 (×5): qty 1
  Filled 2017-06-04: qty 4

## 2017-06-04 NOTE — B[facility] Counselor (Signed)
CSW spoke with pt's mother. She states pt has improved throughout hospitalization.She states "he seems to be trying. I know he wants to leave because he is not comfortable there."  She reports visiting him every night. Upon discharge, pt can return home.    Daisy FloroCandace L Jamill Wetmore MSW, LCSWA  06/04/2017 3:05 PM

## 2017-06-04 NOTE — [facility] Group Notes (Signed)
B[facility] LCSW Group Therapy  06/04/2017 2:15 PM  Type of Therapy:  Group Therapy  Participation Level:  Did Not Attend  Summary of Progress/Problems:Balance in life: Patients will discuss the concept of balance and how it looks and feels to be unbalanced. Pt will identify areas in their life that is unbalanced and ways to become more balanced.   Galadriel Shroff L Shota Kohrs MSW, LCSWA  06/04/2017, 2:15 PM   

## 2017-06-04 NOTE — [facility] Group Notes (Signed)
Patient says that he had a great day today. Patient says that his goal is to go home, get back to work and get back on his feet. Patient says that he has attended all of the groups today.

## 2017-06-04 NOTE — Progress Notes (Signed)
Recreation Therapy Notes  Animal-Assisted Activity (AAA) Program Checklist/Progress Notes Patient Eligibility Criteria Checklist & Daily Group note for Rec Tx Intervention  Date: 06/04/2017 Time: 2:50pm [address]: [address]    AAA/T Program Assumption of Risk Form signed by Patient/ or Parent Legal Guardian No  Behavioral Response: Patient did not attend. Patient discussed with MD for appropriateness in pet therapy session. Both LRT and MD agree patient is appropriate for participation. Recreation therapy intern asked pt if he wanted to participate and he did not want to sign the form.  Marvell Fullerachel Garland Hincapie, Recreation Therapy Intern    Marvell FullerRachel Sameka Bagent 06/04/2017 3:23 PM

## 2017-06-04 NOTE — Progress Notes (Signed)
Patient looked a little brighter today. He reported that his medication working. He denied SI/HI and denied Hallucinations. He stated: "I 'm just waiting to be discharged and go back to my job". He said he works in a [employer] business and we talked about going back to [employer] for further education. Patient asked what writer feels he enjoyed doing. 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 Patient parent came during the assessment. Writer excused herself and allowed patient to visit with his parent. Patient seemed happy to see his parents. Q 15 minute check continues as ordered.

## 2017-06-04 NOTE — Progress Notes (Signed)
[facility]  06/04/2017 2:12 PM Todd FisherJuan Francisco Pasty ArchHernandez [name]  MRN:  [id number] Subjective: Pt states " I am still anxious.'   Objective;Patient seen and chart reviewed.Discussed patient with treatment team.  Pt today seen in bed , poor eye contact , appears to have negative sx of being withdrawn ,flat affect as well as sadness. Pt discussed with about adding celexa, he is agreeable . Per RN pt continues to need a lot of support and encouragement , mostly remains isolative.     Principal Problem: Severe manic bipolar 1 disorder with psychotic behavior (HCC) Diagnosis:   Patient Active Problem List   Diagnosis Date Noted  . Severe manic bipolar 1 disorder with psychotic behavior (HCC) [F31.2] 06/03/2017  . Tobacco use disorder [F17.200] 03/19/2016  . Cannabis use disorder, mild, abuse [F12.10] 03/18/2016   Total Time spent with patient: 25 minutes  Past Psychiatric History: reviewed.  Past Medical History: Please see H&P.  Family History:  Family History  Problem Relation Age of Onset  . Hyperlipidemia Father   . Diabetes Maternal Grandmother   . Alcohol abuse Paternal Grandfather    Family Psychiatric  History: reviewed. Social History:  History  Alcohol Use  . 0.0 oz/week    Comment: Beer and Tequila on weekends.  3 beers and 4 shots of Tequila over 2 weekends per month     History  Drug Use  . Types: Marijuana    Comment: Marijuana:  starts and stops.  When smoking, daily.  Tested every 2 weeks due to taking prescription drug abuse--stopped in [place].  Goes throught 05/2016. Has used MJ since 6th grade.  Went to [employer]--smoked a lot daily.    Social History   Social History  . Marital status: Single    Spouse name: N/A  . Number of children: 0  . 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 Smoking status: Former Smoker    Packs/day: 0.00  . Smokeless tobacco: Never  Used  . Alcohol use 0.0 oz/week     Comment: Beer and Tequila on weekends.  3 beers and 4 shots of Tequila over 2 weekends per month  . Drug use: Yes    Types: Marijuana     Comment: Marijuana:  starts and stops.  When smoking, daily.  Tested every 2 weeks due to taking prescription drug abuse--stopped in [place].  Goes throught 05/2016. Has used MJ since 6th grade.  Went to [employer]--smoked a lot daily.  Marland Kitchen. Sexual activity: Yes    Partners: Female    Birth control/ protection: Condom   Other Topics Concern  . None   Social History Narrative   [place]   [place] 21 [place]     Lives with parents and 21 yo sister.    Additional Social History:    Pain Medications: see  MAR Prescriptions: see MAR Over the Counter: see MAR History of alcohol / drug use?: Yes Name of Substance 1: alcohol 1 - Age of First Use: n/a 1 - Amount (size/oz): "till tipsy" 1 - Frequency: n/a 1 - Duration: n/a 1 - Last Use / Amount: last Saturday Name of Substance 2: cannabis 2 - Age of First Use: n/a 2 - Amount (size/oz): n/a 2 - Frequency: once monthly 2 - Duration: n/a 2 - Last Use / Amount: 1 month ago  Sleep: Fair  Appetite:  Fair  Current Medications: Current Facility-Administered Medications  Medication Dose Route Frequency Provider Last Rate Last Dose  . acetaminophen (TYLENOL) tablet 650 mg  650 mg Oral Q6H PRN Nira Conn A, NP   650 mg at 06/02/17 1408  . alum & mag hydroxide-simeth (MAALOX/MYLANTA) 200-200-20 MG/5ML suspension 30 mL  30 mL Oral Q4H PRN Nira Conn A, NP      . citalopram (CELEXA) tablet 5 mg  5 mg Oral Daily Sarkis Rhines, MD      . hydrOXYzine (ATARAX/VISTARIL) tablet 25 mg  25 mg Oral TID PRN Nira Conn A, NP   25 mg at 06/04/17 0748  . magnesium hydroxide (MILK OF MAGNESIA) suspension 30 mL  30 mL Oral Daily PRN Nira Conn A, NP      . paliperidone (INVEGA SUSTENNA) injection 234 mg  234 mg Intramuscular  Q28 days Cleotis Nipper, MD   234 mg at 06/01/17 1404  . traZODone (DESYREL) tablet 50 mg  50 mg Oral QHS,MR X 1 Nira Conn A, NP   50 mg at 06/03/17 2121    Lab Results:  No results found for this or any previous visit (from the past 48 hour(s)).  Blood Alcohol level:  Lab Results  Component Value Date   ETH <5 03/15/2016    Metabolic Disorder Labs: Lab Results  Component Value Date   HGBA1C 5.4 06/01/2017   MPG 108 06/01/2017   Lab Results  Component Value Date   PROLACTIN 31.5 (H) 06/01/2017   PROLACTIN 34.6 (H) 03/17/2016   Lab Results  Component Value Date   CHOL 182 06/01/2017   TRIG 75 06/01/2017   HDL 36 (L) 06/01/2017   CHOLHDL 5.1 06/01/2017   VLDL 15 06/01/2017   LDLCALC 131 (H) 06/01/2017   LDLCALC 106 03/01/2016    Physical Findings: AIMS: Facial and Oral Movements Muscles of Facial Expression: None, normal Lips and Perioral Area: None, normal Jaw: None, normal Tongue: None, normal,Extremity Movements Upper (arms, wrists, hands, fingers): None, normal Lower (legs, knees, ankles, toes): None, normal, Trunk Movements Neck, shoulders, hips: None, normal, Overall Severity Severity of abnormal movements (highest score from questions above): None, normal Incapacitation due to abnormal movements: None, normal Patient's awareness of abnormal movements (rate only patient's report): No Awareness, Dental Status Current problems with teeth and/or dentures?: No Does patient usually wear dentures?: No  CIWA:    COWS:     Musculoskeletal: Strength & Muscle Tone: within normal limits Gait & Station: normal Patient leans: N/A  Psychiatric Specialty Exam: Physical Exam  Nursing note and vitals reviewed.   Review of Systems  Psychiatric/Behavioral: Positive for depression. The patient is nervous/anxious.   All other systems reviewed and are negative.   Blood pressure 118/69, pulse 76, temperature 98.3 F (36.8 C), temperature source Oral, resp. rate 16,  height 5\' 7"  (1.702 m), weight 77.1 kg (170 lb), SpO2 100 %.Body mass index is 26.63 kg/m.  General Appearance: Casual and Guarded  Eye Contact:  Minimal  Speech:  Slow  Volume:  Decreased  Mood:  Anxious, Depressed and Dysphoric  Affect:  Depressed  Thought Process:  Goal Directed and Descriptions of Associations: Circumstantial  Orientation:  Full (Time, Place, and Person)  Thought Content:  Paranoid Ideation and Rumination  Suicidal Thoughts:  No  Homicidal Thoughts:  No  Memory:  Immediate;   Fair Recent;   Fair Remote;   Fair  Judgement:  Impaired  Insight:  Fair  Psychomotor Activity:  Decreased  Concentration:  Concentration: Fair and Attention Span: Fair  Recall:  [employer] of Knowledge:  Fair  Language:  Fair  Akathisia:  No  Handed:  Right  AIMS (if indicated):     Assets:  Communication Skills Desire for Improvement Housing  ADL's:  Intact  Cognition:  WNL  Sleep:  Number of Hours: 6.75   Severe manic bipolar 1 disorder with psychotic behavior (HCC) unstable  Will continue today 06/04/17  plan as below except where it is noted.   Treatment Plan Summary:Patient with bipolar do , continues to be withdrawn and appears depressed. Will continue to make medication changes . Daily contact with patient to assess and evaluate symptoms and progress in treatment, Medication management and Plan see below    Reviewed past medical records,treatment plan.  Invega Sustenna 234 mg IM - q28 days - last dose 05/31/2017. Repeat Q28 days. Will add Celexa 5 mg po daily for affective sx. Trazodone 50 mg po qhs for sleep. Will monitor for medical issues as well as call consult as needed.  Reviewed labs PL elevated - will monitor .  CSW will continue working on disposition.  Patient to participate in therapeutic milieu .         Candy Leverett, MD 06/04/2017, 2:12 PM

## 2017-06-04 NOTE — Progress Notes (Signed)
Recreation Therapy Notes  Date: 06/04/17 Time: 1000 Location: [address] Dayroom  Group Topic: Leisure Education  Goal Area(s) Addresses:  Patient will identify positive leisure activities.  Patient will identify one positive benefit of participation in leisure activities.   Intervention: White board, dry erase marker, eraser, can with various words and activities.  Activity: Pictionary.  Patients were divided into two teams.  Each player from each team would get a chance to draw a picture on the board.  Players were to pick a slip of paper from the can and draw what was on the paper.  Their team would then try to guess what the picture is.  If the teams does not guess correctly the other team gets a chance to steal the point.  Each team gets one minute per turn.  Education:  Leisure Education, Discharge Planning  Education Outcome: Acknowledges education/In group clarification offered/Needs additional education  Clinical Observations/Feedback: Pt did not attend group.    Shenay Torti, LRT/CTRS         Quinntin Malter A 06/04/2017 12:49 PM 

## 2017-06-04 NOTE — Progress Notes (Signed)
D: Pt A & O X3. Presents guarded with poor eye contact, depressed affect and mood. Denies SI, HI, AVH and pain when assessed. Isolative to his room, minimal interactions noted with peers and staff on as need basis. Slept majority of this shift.  A: Scheduled and PRN medications administered as prescribed and effects monitored. Support and availability provided to pt. Pt  Encouraged to voice concerns and comply with current treatment regimen including scheduled unit groups. Pt informed of changes to his order (Celexa). Q 15 minutes safety checks maintained on and off unit without issues.  R: Pt verbalized understanding related to medication changes. Took his medications as ordered. Denies adverse drug reactions when assessed. Pt did not attend group this shift as he was asleep at that time. Remains safe on and off unit. POC continues for safety and mood stability.

## 2017-06-05 MED ORDER — PALIPERIDONE PALMITATE 156 MG/ML IM SUSP: 156.0000 mg | INTRAMUSCULAR | Status: DC

## 2017-06-05 MED ORDER — PALIPERIDONE PALMITATE 156 MG/ML IM SUSP
156.0000 mg | Freq: Once | INTRAMUSCULAR | Status: AC
  Administered 2017-06-06: 156 mg via INTRAMUSCULAR
  Filled 2017-06-05: qty 1

## 2017-06-05 NOTE — [facility] Group Notes (Signed)
[facility] Group Notes:  (Counselor/Nursing/MHT/Case Management/Adjunct)  06/05/2017 1:15PM  Type of Therapy:  Group Therapy  Participation Level:  Active  Participation Quality:  Appropriate  Affect:  Flat  Cognitive:  Oriented  Insight:  Improving  Engagement in Group:  Limited  Engagement in Therapy:  Limited  Modes of Intervention:  Discussion, Exploration and Socialization  Summary of Progress/Problems: The topic for group was balance in life.  Pt participated in the discussion about when their life was in balance and out of balance and how this feels.  Pt discussed ways to get back in balance and short term goals they can work on to get where they want to be. Stayed the entire time, engaged throughout, but minimal interaction.  "I am balanced because I feel good," he stated with a shy smile.  "I am no longer confused and my thoughts are clear.  Before, it was causing me to pace and stare."  Cited being around family as something that helps him find balance.   Todd [name], Todd [name] 06/05/2017 4:24 PM

## 2017-06-05 NOTE — Progress Notes (Signed)
Recreation Therapy Notes  Date: 06/05/17 Time: 1000 Location: [address] Dayroom  Group Topic: Self-Esteem  Goal Area(s) Addresses:  Patient will identify positive ways to increase self-esteem. Patient will verbalize benefit of increased self-esteem.  Intervention: Markers, construction paper, scissors  Activity: Brochure About Me.  Patients were to create a brochure that highlighted the positive qualities about themselves.  Patients were to identify things that make them unique or that make them proud.  Education:  Self-Esteem, Building control surveyorDischarge Planning.   Education Outcome: Acknowledges education/In group clarification offered/Needs additional education  Clinical Observations/Feedback: Pt did not attend group.   Caroll RancherMarjette Daja Shuping, LRT/CTRS         Caroll RancherLindsay, Hussam Muniz A 06/05/2017 12:22 PM

## 2017-06-05 NOTE — Progress Notes (Signed)
Patient has been isolative, denies SI, HI and AVH this shift. Patient attended groups with social worker and socialized minimally in the cafeteria.   Assess patient for safety, offer medications as prescribed, engage patient in 1:1 staff talks  Continue to monitor.  Patient able to contract for safety.  

## 2017-06-05 NOTE — Progress Notes (Signed)
[facility] MD Progress Note  06/05/2017 4:08 PM Todd [name] Antone Summons  MRN:  [id number] Subjective:Pt states " I am still anxious.'    Objective;Patient seen and chart reviewed.Discussed patient with treatment team.  Pt today seen as withdrawn , however has been attending groups when encouraged. Pt continues to have poor eye contact as well as some negative sx. Per RN , continues to need support, is compliant on medications , denies ADRs.       Principal Problem: Severe manic bipolar 1 disorder with psychotic behavior (HCC) Diagnosis:   Patient Active Problem List   Diagnosis Date Noted  . Severe manic bipolar 1 disorder with psychotic behavior (HCC) [F31.2] 06/03/2017  . Tobacco use disorder [F17.200] 03/19/2016  . Cannabis use disorder, mild, abuse [F12.10] 03/18/2016   Total Time spent with patient: 20 minutes  Past Psychiatric History: reviewed.  Past Medical History: Please see H&P.  Family History:  Family History  Problem Relation Age of Onset  . Hyperlipidemia Father   . Diabetes Maternal Grandmother   . Alcohol abuse Paternal Grandfather    Family Psychiatric  History: reviewed. Social History:  History  Alcohol Use  . 0.0 oz/week    Comment: Beer and Tequila on weekends.  3 beers and 4 shots of Tequila over 2 weekends per month     History  Drug Use  . Types: Marijuana    Comment: Marijuana:  starts and stops.  When smoking, daily.  Tested every 2 weeks due to taking prescription drug abuse--stopped in [place].  Goes throught 05/2016. Has used MJ since 6th grade.  Went to [employer] a lot daily.    Social History   Social History  . Marital status: Single    Spouse name: N/A  . Number of children: 0  . 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 Smoking status: Former Smoker    Packs/day: 0.00  . Smokeless tobacco: Never Used  . Alcohol use 0.0 oz/week      Comment: Beer and Tequila on weekends.  3 beers and 4 shots of Tequila over 2 weekends per month  . Drug use: Yes    Types: Marijuana     Comment: Marijuana:  starts and stops.  When smoking, daily.  Tested every 2 weeks due to taking prescription drug abuse--stopped in [place].  Goes throught 05/2016. Has used MJ since 6th grade.  Went to [employer] a lot daily.  Marland Kitchen Sexual activity: Yes    Partners: Female    Birth control/ protection: Condom   Other Topics Concern  . None   Social History Narrative   Originally from [place]   Came to [employer]. When 21 years old.     Lives with parents and 66 yo sister.    Additional Social History:    Pain Medications: see  MAR Prescriptions: see MAR Over the Counter: see MAR History of alcohol / drug use?: Yes Name of Substance 1: alcohol 1 - Age of First Use: n/a 1 - Amount (size/oz): "till tipsy" 1 - Frequency: n/a 1 - Duration: n/a 1 - Last Use / Amount: last Saturday Name of Substance 2: cannabis 2 - Age of First Use: n/a 2 - Amount (size/oz): n/a 2 - Frequency: once monthly 2 - Duration: n/a 2 - Last Use / Amount: 1 month ago  Sleep: Fair  Appetite:  Fair  Current Medications: Current Facility-Administered Medications  Medication Dose Route Frequency Provider Last Rate Last Dose  . acetaminophen (TYLENOL) tablet 650 mg  650 mg Oral Q6H PRN Nira ConnBerry, Jason A, NP   650 mg at 06/04/17 2149  . alum & mag hydroxide-simeth (MAALOX/MYLANTA) 200-200-20 MG/5ML suspension 30 mL  30 mL Oral Q4H PRN Nira ConnBerry, Jason A, NP      . citalopram (CELEXA) tablet 5 mg  5 mg Oral Daily Kamri Gotsch, MD   5 mg at 06/05/17 0849  . hydrOXYzine (ATARAX/VISTARIL) tablet 25 mg  25 mg Oral TID PRN Jackelyn PolingBerry, Jason A, NP   25 mg at 06/04/17 0748  . magnesium hydroxide (MILK OF MAGNESIA) suspension 30 mL  30 mL Oral Daily PRN Nira ConnBerry, Jason A, NP      . traZODone (DESYREL) tablet 50 mg  50 mg Oral QHS,MR X 1 Nira ConnBerry, Jason A, NP   50 mg at  06/04/17 2120    Lab Results:  No results found for this or any previous visit (from the past 48 hour(s)).  Blood Alcohol level:  Lab Results  Component Value Date   ETH <5 03/15/2016    Metabolic Disorder Labs: Lab Results  Component Value Date   HGBA1C 5.4 06/01/2017   MPG 108 06/01/2017   Lab Results  Component Value Date   PROLACTIN 31.5 (H) 06/01/2017   PROLACTIN 34.6 (H) 03/17/2016   Lab Results  Component Value Date   CHOL 182 06/01/2017   TRIG 75 06/01/2017   HDL 36 (L) 06/01/2017   CHOLHDL 5.1 06/01/2017   VLDL 15 06/01/2017   LDLCALC 131 (H) 06/01/2017   LDLCALC 106 03/01/2016    Physical Findings: AIMS: Facial and Oral Movements Muscles of Facial Expression: None, normal Lips and Perioral Area: None, normal Jaw: None, normal Tongue: None, normal,Extremity Movements Upper (arms, wrists, hands, fingers): None, normal Lower (legs, knees, ankles, toes): None, normal, Trunk Movements Neck, shoulders, hips: None, normal, Overall Severity Severity of abnormal movements (highest score from questions above): None, normal Incapacitation due to abnormal movements: None, normal Patient's awareness of abnormal movements (rate only patient's report): No Awareness, Dental Status Current problems with teeth and/or dentures?: No Does patient usually wear dentures?: No  CIWA:    COWS:     Musculoskeletal: Strength & Muscle Tone: within normal limits Gait & Station: normal Patient leans: N/A  Psychiatric Specialty Exam: Physical Exam  Nursing note and vitals reviewed.   Review of Systems  Psychiatric/Behavioral: Positive for depression. The patient is nervous/anxious.   All other systems reviewed and are negative.   Blood pressure 99/68, pulse 98, temperature 98.3 F (36.8 C), temperature source Oral, resp. rate 16, height 5\' 7"  (1.702 m), weight 77.1 kg (170 lb), SpO2 100 %.Body mass index is 26.63 kg/m.  General Appearance: Casual and Guarded  Eye  Contact:  Minimal  Speech:  Slow  Volume:  Decreased  Mood:  Anxious, Depressed and Dysphoric  Affect:  Depressed  Thought Process:  Goal Directed and Descriptions of Associations: Circumstantial  Orientation:  Full (Time, Place, and Person)  Thought Content:  Paranoid Ideation and Rumination  Suicidal Thoughts:  No  Homicidal Thoughts:  No  Memory:  Immediate;   Fair Recent;   Fair Remote;   Fair  Judgement:  Impaired  Insight:  Fair  Psychomotor Activity:  Decreased  Concentration:  Concentration: Fair and Attention Span: Fair  Recall:  [employer]Fair  Fund of Knowledge:  Fair  Language:  Fair  Akathisia:  No  Handed:  Right  AIMS (if indicated):     Assets:  Communication Skills Desire for Improvement Housing  ADL's:  Intact  Cognition:  WNL  Sleep:  Number of Hours: 5.25   Will continue today 06/05/17  plan as below except where it is noted.   Treatment Plan Summary:Patient with bipolar do , continues to have negative sx. Pt continues to need treatment.   Daily contact with patient to assess and evaluate symptoms and progress in treatment, Medication management and Plan see below    Reviewed past medical records,treatment plan.  Invega Sustenna 234 mg IM  - dose 06/01/2017.CSW Rod to find out of patient was noncompliant on Invega sustenna IM prior to admission. If so he will need a second dose of Invega sustenna 156 mg IM on 06/06/17 and the  q28 days .  Continue Celexa 5 mg po daily for affective sx. Trazodone 50 mg po qhs for sleep. Will monitor for medical issues as well as call consult as needed.  Reviewed labs PL elevated - will monitor .  CSW will continue working on disposition.  Patient to participate in therapeutic milieu .         Trayce Caravello, MD 06/05/2017, 4:08 PM

## 2017-06-06 NOTE — [facility] Group Notes (Signed)
[facility]  06/06/2017 , 3:46 PM    Type of Therapy:  [facility] Presentation  Participation Level:  Active  Participation Quality:  Attentive  Affect:  Blunted  Cognitive:  Oriented  Insight:  Limited  Engagement in Therapy:  Engaged  Modes of Intervention:  Discussion, Education and Socialization  Summary of Progress/Problems:  Onalee HuaDavid from [facility] [employer] [facility] came to present his recovery story and play the guitar.  Reluctant to attend.  Eventually came when I reminded him that the Dr asks me about group attendance.  Stayed the rest of the time, engaged throughout.  Daryel Geraldorth, Gwenlyn Hottinger B 06/06/2017 , 3:46 PM

## 2017-06-06 NOTE — Tx Team (Signed)
Interdisciplinary Treatment and Diagnostic Plan Update  06/06/2017 Time of Session: 9:00 AM [address] Pasty ArchHernandez Avalos MRN: [id number]  Principal Diagnosis: Severe manic bipolar 1 disorder with psychotic behavior (HCC)  Secondary Diagnoses: Principal Problem:   Severe manic bipolar 1 disorder with psychotic behavior (HCC) Active Problems:   Cannabis use disorder, mild, abuse   Current Medications:  Current Facility-Administered Medications  Medication Dose Route Frequency Provider Last Rate Last Dose  . acetaminophen (TYLENOL) tablet 650 mg  650 mg Oral Q6H PRN Nira ConnBerry, Jason A, NP   650 mg at 06/04/17 2149  . alum & mag hydroxide-simeth (MAALOX/MYLANTA) 200-200-20 MG/5ML suspension 30 mL  30 mL Oral Q4H PRN Nira ConnBerry, Jason A, NP      . citalopram (CELEXA) tablet 5 mg  5 mg Oral Daily Eappen, Saramma, MD   5 mg at 06/06/17 0857  . hydrOXYzine (ATARAX/VISTARIL) tablet 25 mg  25 mg Oral TID PRN Jackelyn PolingBerry, Jason A, NP   25 mg at 06/04/17 0748  . magnesium hydroxide (MILK OF MAGNESIA) suspension 30 mL  30 mL Oral Daily PRN Nira ConnBerry, Jason A, NP      . paliperidone (INVEGA SUSTENNA) injection 156 mg  156 mg Intramuscular Once Jomarie LongsEappen, Saramma, MD      . Melene Muller[START ON 07/03/2017] paliperidone (INVEGA SUSTENNA) injection 156 mg  156 mg Intramuscular Q28 days Eappen, Levin BaconSaramma, MD      . traZODone (DESYREL) tablet 50 mg  50 mg Oral QHS,MR X 1 Nira ConnBerry, Jason A, NP   50 mg at 06/05/17 2135   PTA Medications: Prescriptions Prior to Admission  Medication Sig Dispense Refill Last Dose  . paliperidone (INVEGA SUSTENNA) 234 MG/1.5ML SUSP injection Inject 234 mg into the muscle every 28 (twenty-eight) days. (Patient not taking: Reported on 05/31/2017) 0.9 mL 1 Unknown  . [DISCONTINUED] fluconazole (DIFLUCAN) 150 MG tablet 1 tab by mouth daily for 3 days (Patient not taking: Reported on 05/31/2017) 3 tablet 0 Unknown    Patient Stressors: Medication change or noncompliance  Patient Strengths: Average or above average  intelligence Communication skills Supportive family/friends  Treatment Modalities: Medication Management, Group therapy, Case management,  1 to 1 session with clinician, Psychoeducation, Recreational therapy.   Physician Treatment Plan for Primary Diagnosis: Severe manic bipolar 1 disorder with psychotic behavior (HCC) Long Term Goal(s): Improvement in symptoms so as ready for discharge Improvement in symptoms so as ready for discharge   Short Term Goals: Ability to identify changes in lifestyle to reduce recurrence of condition will improve Ability to verbalize feelings will improve Ability to demonstrate self-control will improve Ability to identify and develop effective coping behaviors will improve Ability to verbalize feelings will improve Ability to identify and develop effective coping behaviors will improve Ability to maintain clinical measurements within normal limits will improve Compliance with prescribed medications will improve Ability to identify triggers associated with substance abuse/mental health issues will improve  Medication Management: Evaluate patient's response, side effects, and tolerance of medication regimen.  Therapeutic Interventions: 1 to 1 sessions, Unit Group sessions and Medication administration.  Evaluation of Outcomes: Adequate for Discharge  Physician Treatment Plan for Secondary Diagnosis: Principal Problem:   Severe manic bipolar 1 disorder with psychotic behavior (HCC) Active Problems:   Cannabis use disorder, mild, abuse  Long Term Goal(s): Improvement in symptoms so as ready for discharge Improvement in symptoms so as ready for discharge   Short Term Goals: Ability to identify changes in lifestyle to reduce recurrence of condition will improve Ability to verbalize feelings will improve  Ability to demonstrate self-control will improve Ability to identify and develop effective coping behaviors will improve Ability to verbalize feelings  will improve Ability to identify and develop effective coping behaviors will improve Ability to maintain clinical measurements within normal limits will improve Compliance with prescribed medications will improve Ability to identify triggers associated with substance abuse/mental health issues will improve     Medication Management: Evaluate patient's response, side effects, and tolerance of medication regimen.  Therapeutic Interventions: 1 to 1 sessions, Unit Group sessions and Medication administration.  Evaluation of Outcomes: Adequate for Discharge   RN Treatment Plan for Primary Diagnosis: Severe manic bipolar 1 disorder with psychotic behavior (HCC) Long Term Goal(s): Knowledge of disease and therapeutic regimen to maintain health will improve  Short Term Goals: Ability to remain free from injury will improve, Ability to verbalize feelings will improve, Ability to disclose and discuss suicidal ideas and Compliance with prescribed medications will improve  Medication Management: RN will administer medications as ordered by provider, will assess and evaluate patient's response and provide education to patient for prescribed medication. RN will report any adverse and/or side effects to prescribing provider.  Therapeutic Interventions: 1 on 1 counseling sessions, Psychoeducation, Medication administration, Evaluate responses to treatment, Monitor vital signs and CBGs as ordered, Perform/monitor CIWA, COWS, AIMS and Fall Risk screenings as ordered, Perform wound care treatments as ordered.  Evaluation of Outcomes: Adequate for Discharge   LCSW Treatment Plan for Primary Diagnosis: Severe manic bipolar 1 disorder with psychotic behavior (HCC) Long Term Goal(s): Safe transition to appropriate next level of care at discharge, Engage patient in therapeutic group addressing interpersonal concerns.  Short Term Goals: Engage patient in aftercare planning with referrals and resources, Increase  social support and Increase emotional regulation  Therapeutic Interventions: Assess for all discharge needs, 1 to 1 time with Social worker, Explore available resources and support systems, Assess for adequacy in community support network, Educate family and significant other(s) on suicide prevention, Complete Psychosocial Assessment, Interpersonal group therapy.  Evaluation of Outcomes: Adequate for Discharge   Progress in Treatment: Attending groups: No. Participating in groups: No. Taking medication as prescribed: Yes. Toleration medication: Yes. Family/Significant other contact made: No, will contact:  Pt refused, CSW will reassess Patient understands diagnosis: Yes. Discussing patient identified problems/goals with staff: Yes. Medical problems stabilized or resolved: Yes. Denies suicidal/homicidal ideation: No. Issues/concerns per patient self-inventory: No.  New problem(s) identified: No, Describe:  None identified.  New Short Term/Long Term Goal(s): Patient stated that his goal is to "feel better."  Discharge Plan or Barriers: CSW assessing appropriate discharge plan.  Reason for Continuation of Hospitalization:   Estimated Length of Stay: Likely d/c tomorrow dependent on pt's willingness to get second injection of Invega  Attendees: Patient:  06/06/2017 3:45 PM  Physician: Dr. Shela Commons 06/06/2017 3:45 PM  Nursing: Elby Beck, RN  06/06/2017 3:45 PM  RN Care Manager: 06/06/2017 3:45 PM  Social Worker: Richelle Ito  LCSW 06/06/2017 3:45 PM  Recreational Therapist:  06/06/2017 3:45 PM  Other:  06/06/2017 3:45 PM  Other:  06/06/2017 3:45 PM  Other: 06/06/2017 3:45 PM    Scribe for Treatment Team: Ida [name], LCSW 06/06/2017 3:45 PM

## 2017-06-06 NOTE — Progress Notes (Signed)
Patient has been isolative, denies SI, HI and AVH this shift. Patient attended groups with social worker and socialized minimally in the cafeteria.   Assess patient for safety, offer medications as prescribed, engage patient in 1:1 staff talks  Continue to monitor.  Patient able to contract for safety.

## 2017-06-06 NOTE — Progress Notes (Signed)
[facility]  06/06/2017 1:39 PM Edman CircleJuan Francisco Hernandez Avalos  MRN:  [id number]   Subjective:Pt states " I'm fine and I feel perfectly fine and I don't want to be in day Room, so I am staying myself in my room. I have taken Invega shot on Saturday and states that he is not going to take it again." Patient Stated that he is not going to take any more monthly shots after this one, and he has been anxious.'  Objective: Patient seen and chart reviewed.Discussed patient with treatment team. Patient appeared staying in his bed, calm and Poorly cooperative, Isolated and withdrawn. Patient required a lot of redirection and encouragement to participate in group activities and milieu therapy. Staff RN reported patient continued to exhibit bizarre behaviors sometimes manic symptoms and noncompliant with his medication management. Patient also found inappropriately laughing and also known for substance abuse. Patient denies current suicidal/homicidal ideation, intention or plans. Patient contract for safety while in the hospital.  Principal Problem: Severe manic bipolar 1 disorder with psychotic behavior (HCC) Diagnosis:   Patient Active Problem List   Diagnosis Date Noted  . Severe manic bipolar 1 disorder with psychotic behavior (HCC) [F31.2] 06/03/2017  . Tobacco use disorder [F17.200] 03/19/2016  . Cannabis use disorder, mild, abuse [F12.10] 03/18/2016   Total Time spent with patient: 20 minutes  Past Psychiatric History: reviewed.  Past Medical History: Please see H&P.  Family History:  Family History  Problem Relation Age of Onset  . Hyperlipidemia Father   . Diabetes Maternal Grandmother   . Alcohol abuse Paternal Grandfather    Family Psychiatric  History: reviewed. Social History:  History  Alcohol Use  . 0.0 oz/week    Comment: Beer and Tequila on weekends.  3 beers and 4 shots of Tequila over 2 weekends per month     History  Drug Use  . Types: Marijuana    Comment:  Marijuana:  starts and stops.  When smoking, daily.  Tested every 2 weeks due to taking prescription drug abuse--[place]  Goes throught 05/2016. Has used MJ since 6th grade.  Went to [employer]--smoked a lot daily.    Social History   Social History  . Marital status: Single    Spouse name: N/A  . Number of children: 0  . 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 Smoking status: Former Smoker    Packs/day: 0.00  . Smokeless tobacco: Never Used  . Alcohol use 0.0 oz/week     Comment: Beer and Tequila on weekends.  3 beers and 4 shots of Tequila over 2 weekends per month  . Drug use: Yes    Types: Marijuana     Comment: Marijuana:  starts and stops.  When smoking, daily.  Tested every 2 weeks due to taking prescription drug abuse--sto[place] in [place].  Goes throught 05/2016. Has used MJ since 6th grade.  Went to [employer]--smoked a lot daily.  Marland Kitchen. Sexual activity: Yes    Partners: Female    Birth control/ protection: Condom   Other Topics Concern  . None   Social History Narrative   [place]   [place] 21 [place]     Lives with parents and 21 yo sister.    Additional Social History:    Pain Medications: see  MAR Prescriptions: see MAR Over the Counter: see MAR History  of alcohol / drug use?: Yes Name of Substance 1: alcohol 1 - Age of First Use: n/a 1 - Amount (size/oz): "till tipsy" 1 - Frequency: n/a 1 - Duration: n/a 1 - Last Use / Amount: last Saturday Name of Substance 2: cannabis 2 - Age of First Use: n/a 2 - Amount (size/oz): n/a 2 - Frequency: once monthly 2 - Duration: n/a 2 - Last Use / Amount: 1 month ago                Sleep: Fair  Appetite:  Fair  Current Medications: Current Facility-Administered Medications  Medication Dose Route Frequency Provider Last Rate Last Dose  . acetaminophen (TYLENOL) tablet 650 mg  650 mg Oral  Q6H PRN Nira Conn A, NP   650 mg at 06/04/17 2149  . alum & mag hydroxide-simeth (MAALOX/MYLANTA) 200-200-20 MG/5ML suspension 30 mL  30 mL Oral Q4H PRN Nira Conn A, NP      . citalopram (CELEXA) tablet 5 mg  5 mg Oral Daily Eappen, Saramma, MD   5 mg at 06/06/17 0857  . hydrOXYzine (ATARAX/VISTARIL) tablet 25 mg  25 mg Oral TID PRN Jackelyn Poling, NP   25 mg at 06/04/17 0748  . magnesium hydroxide (MILK OF MAGNESIA) suspension 30 mL  30 mL Oral Daily PRN Nira Conn A, NP      . paliperidone (INVEGA SUSTENNA) injection 156 mg  156 mg Intramuscular Once Jomarie Longs, MD      . Melene Muller ON 07/03/2017] paliperidone (INVEGA SUSTENNA) injection 156 mg  156 mg Intramuscular Q28 days Eappen, Levin Bacon, MD      . traZODone (DESYREL) tablet 50 mg  50 mg Oral QHS,MR X 1 Nira Conn A, NP   50 mg at 06/05/17 2135    Lab Results:  No results found for this or any previous visit (from the past 48 hour(s)).  Blood Alcohol level:  Lab Results  Component Value Date   ETH <5 03/15/2016    Metabolic Disorder Labs: Lab Results  Component Value Date   HGBA1C 5.4 06/01/2017   MPG 108 06/01/2017   Lab Results  Component Value Date   PROLACTIN 31.5 (H) 06/01/2017   PROLACTIN 34.6 (H) 03/17/2016   Lab Results  Component Value Date   CHOL 182 06/01/2017   TRIG 75 06/01/2017   HDL 36 (L) 06/01/2017   CHOLHDL 5.1 06/01/2017   VLDL 15 06/01/2017   LDLCALC 131 (H) 06/01/2017   LDLCALC 106 03/01/2016    Physical Findings: AIMS: Facial and Oral Movements Muscles of Facial Expression: None, normal Lips and Perioral Area: None, normal Jaw: None, normal Tongue: None, normal,Extremity Movements Upper (arms, wrists, hands, fingers): None, normal Lower (legs, knees, ankles, toes): None, normal, Trunk Movements Neck, shoulders, hips: None, normal, Overall Severity Severity of abnormal movements (highest score from questions above): None, normal Incapacitation due to abnormal movements: None,  normal Patient's awareness of abnormal movements (rate only patient's report): No Awareness, Dental Status Current problems with teeth and/or dentures?: No Does patient usually wear dentures?: No  CIWA:    COWS:     Musculoskeletal: Strength & Muscle Tone: within normal limits Gait & Station: normal Patient leans: N/A  Psychiatric Specialty Exam: Physical Exam  Nursing note and vitals reviewed.   Review of Systems  Psychiatric/Behavioral: Positive for depression. The patient is nervous/anxious.   All other systems reviewed and are negative.   Blood pressure (!) 124/108, pulse 95, temperature 98.2 F (36.8 C), temperature source Oral, resp. rate  18, height 5\' 7"  (1.702 m), weight 77.1 kg (170 lb), SpO2 100 %.Body mass index is 26.63 kg/m.  General Appearance: Casual and Guarded  Eye Contact:  Minimal  Speech:  Slow  Volume:  Decreased  Mood:  Anxious, Depressed and Dysphoric  Affect:  Depressed  Thought Process:  Goal Directed and Descriptions of Associations: Circumstantial  Orientation:  Full (Time, Place, and Person)  Thought Content:  Paranoid Ideation and Rumination  Suicidal Thoughts:  No  Homicidal Thoughts:  No  Memory:  Immediate;   Fair Recent;   Fair Remote;   Fair  Judgement:  Impaired  Insight:  Fair  Psychomotor Activity:  Decreased  Concentration:  Concentration: Fair and Attention Span: Fair  Recall:  [employer] of Knowledge:  Fair  Language:  Fair  Akathisia:  No  Handed:  Right  AIMS (if indicated):     Assets:  Communication Skills Desire for Improvement Housing  ADL's:  Intact  Cognition:  WNL  Sleep:  Number of Hours: 5.75   Will continue today 06/06/17  plan as below except where it is noted.  Treatment Plan Summary: Patient with bipolar do , continues to have negative sx. Patient has been with the poor insight, judgment and partially compliant with the program and medication management. Patient continued to make statement I do not want to  take this medication after discharge from the hospital. Patient mother has been supportive for the treatment.Pt continues to need treatment.  Daily contact with patient to assess and evaluate symptoms and progress in treatment, Medication management and Plan see below   Reviewed past medical records,treatment plan.  Invega Sustenna 234 mg IM  - dose 06/01/2017. CSW Rod to find out of patient was noncompliant on Invega sustenna IM prior to admission. If so he will need a second dose of Invega sustenna 156 mg IM on 06/06/17 and the q28 days .  Continue Celexa 5 mg po daily for affective sx. Continue Trazodone 50 mg po qhs for sleep. Will monitor for medical issues as well as call consult as needed.  Reviewed labs PL elevated - will monitor .  CSW will continue working on disposition.  Patient to participate in therapeutic milieu .   Leata Mouse, MD 06/06/2017, 1:39 PM

## 2017-06-06 NOTE — Progress Notes (Signed)
   D: Pt was laying in bed during the assessment. Pt avoided eye contact with the writer, though he was laying on his back. When asked if he had any questions or concerns pt stated, "when am I gonna be leaving". Writer explained that the dr would be the only person with that info. Pt has no questions or concerns. Later in the shift writer observed pt sitting at the end of the hall, on the floor.    A:  Support and encouragement was offered. 15 min checks continued for safety.  R: Pt remains safe.

## 2017-06-06 NOTE — Progress Notes (Signed)
Recreation Therapy Notes  Date: 06/06/17 Time: 1000 Location: 500 Hall Dayroom  Group Topic: Communication, Team Building, Problem Solving  Goal Area(s) Addresses:  Patient will effectively work with peer towards shared goal.  Patient will identify skills used to make activity successful.  Patient will identify how skills used during activity can be used to reach post d/c goals.   Intervention: STEM Activity  Activity: Stage managerLanding Pad. In teams patients were given 12 plastic drinking straws and a length of masking tape. Using the materials provided patients were asked to build a landing pad to catch a golf ball dropped from approximately 6 feet in the air.   Education: Pharmacist, communityocial Skills, Discharge Planning   Education Outcome: Acknowledges education/In group clarification offered/Needs additional education.   Clinical Observations/Feedback:  Pt did not attend group.   Caroll RancherMarjette Daisie Haft, LRT/CTRS         Caroll RancherLindsay, Maziah Smola A 06/06/2017 12:04 PM

## 2017-06-07 ENCOUNTER — Encounter (HOSPITAL_COMMUNITY): Payer: Self-pay | Admitting: Behavioral Health

## 2017-06-07 MED ORDER — HYDROXYZINE HCL 25 MG PO TABS: 25.0000 mg | ORAL_TABLET | Freq: Three times a day (TID) | ORAL | 0 refills | Status: DC | PRN

## 2017-06-07 MED ORDER — PALIPERIDONE PALMITATE 156 MG/ML IM SUSP: 156.0000 mg | INTRAMUSCULAR | 0 refills | Status: DC

## 2017-06-07 MED ORDER — CITALOPRAM HYDROBROMIDE 10 MG PO TABS: 5.0000 mg | ORAL_TABLET | Freq: Every day | ORAL | 0 refills | Status: DC

## 2017-06-07 MED ORDER — TRAZODONE HCL 50 MG PO TABS: 50.0000 mg | ORAL_TABLET | Freq: Every evening | ORAL | 0 refills | Status: DC | PRN

## 2017-06-07 NOTE — Progress Notes (Signed)
  [facility] :  Will you be returning to the same living situation after discharge:  Yes,  home At discharge, do you have transportation home?: Yes,  family Do you have the ability to pay for your medications: Yes,  mental health  Release of information consent forms completed and in the chart;  Patient's signature needed at discharge.  Patient to Follow up at: Follow-up Information    [facility] Follow up on 06/10/2017.   Specialty:  Behavioral Health Why:  Monday morning at 8:00am for your hospital discharge appointment. Please arrive 15 minutes early to start your paperwork.  Contact information: [address] [place] [address] [phone]           Next level of care provider has access to [facility] Link:no  Safety Planning and Suicide Prevention discussed: Yes,  yes  Have you used any form of tobacco in the last 30 days? (Cigarettes, Smokeless Tobacco, Cigars, and/or Pipes): No  Has patient been referred to the Quitline?: N/A patient is not a smoker  Patient has been referred for addiction treatment: Yes  Todd RogueRodney B Todd [name] Todd [name] 06/07/2017, 10:45 AM

## 2017-06-07 NOTE — Progress Notes (Signed)
Recreation Therapy Notes  Date: 06/07/17 Time: 1000 Location: 500 Hall Dayroom  Group Topic: Wellness  Goal Area(s) Addresses:  Patient will define components of whole wellness. Patient will verbalize benefit of whole wellness.  Intervention:  UnitedHealthBeach ball, chairs  Activity: Keep It ContractorGoing Volleyball.  Patients were placed in a circle and given one beach ball.  Patients were to pass the ball to each other without it coming to a complete stop.  Patients could bounce the ball off of the floor but had to remain in motion.  LRT would count the number of hits on the ball.  If the ball came to a stop, the count would start over.  Education: Wellness, Building control surveyorDischarge Planning.   Education Outcome: Acknowledges education/In group clarification offered/Needs additional education.   Clinical Observations/Feedback: Pt did not attend group.   Caroll RancherMarjette Elija Mccamish, LRT/CTRS         Caroll RancherLindsay, Amiya Escamilla A 06/07/2017 12:07 PM

## 2017-06-07 NOTE — [facility] Suicide Risk Assessment (Signed)
[facility] INPATIENT:  Family/Significant Other Suicide Prevention Education  Suicide Prevention Education:  Education Completed; Todd [name], mother, 27  has been identified by the patient as the family member/significant other with whom the patient will be residing, and identified as the person(s) who will aid the patient in the event of a mental health crisis (suicidal ideations/suicide attempt).  With written consent from the patient, the family member/significant other has been provided the following suicide prevention education, prior to the and/or following the discharge of the patient.  The suicide prevention education provided includes the following:  Suicide risk factors  Suicide prevention and interventions  National Suicide Hotline telephone number  [facility] assessment telephone number  [facility] Emergency Assistance 911  [facility] and/or Residential Mobile Crisis Unit telephone number  Request made of family/significant other to:  Remove weapons (e.g., guns, rifles, knives), all items previously/currently identified as safety concern.    Remove drugs/medications (over-the-counter, prescriptions, illicit drugs), all items previously/currently identified as a safety concern.  The family member/significant other verbalizes understanding of the suicide prevention education information provided.  The family member/significant other agrees to remove the items of safety concern listed above. Went over crises plan and treatment team recommendations. Used interpreter-Spanish. Made sure mother understood when he is due for an injection again.  Todd [name] 06/07/2017, 10:33 AM

## 2017-06-07 NOTE — Progress Notes (Signed)
Patient ID: Todd CircleJuan Francisco Hernandez Avalos, male   DOB: 10/08/1996, 21 y.o.   MRN: [id number] Patient discharged to home/self care in the company of his mother.  Patient acknowledged understanding of all discharge instructions.  Patient denies SI, Hi and AVH upon discharge.  Patient acknowledged the receipt of all personal belongings.

## 2017-06-07 NOTE — Discharge Summary (Signed)
Physician Discharge Summary Note  Patient:  Todd [name] is an 21 y.o., male MRN:  [id number] DOB:  1996/03/27 Patient phone:  [phone] (home)  Patient address:   [address] [place] [address],  Total Time spent with patient: 30 minutes  Date of Admission:  05/31/2017 Date of Discharge: 06/07/2017  Reason for Admission:  manic episode and psychosis  Principal Problem: Severe manic bipolar 1 disorder with psychotic behavior [facility]) Discharge Diagnoses: Patient Active Problem List   Diagnosis Date Noted  . Severe manic bipolar 1 disorder with psychotic behavior (HCC) [F31.2] 06/03/2017  . Tobacco use disorder [F17.200] 03/19/2016  . Cannabis use disorder, mild, abuse [F12.10] 03/18/2016    Past Psychiatric History: Patient was admitted to psychiatric hospital in March 2017 he was discharged in an vague and Risperdal.  Patient was seeing [facility] but has been noncompliant for past few months.  Past Medical History: History reviewed. No pertinent past medical history. History reviewed. No pertinent surgical history. Family History:  Family History  Problem Relation Age of Onset  . Hyperlipidemia Father   . Diabetes Maternal Grandmother   . Alcohol abuse Paternal Grandfather    Family Psychiatric  History: Unknown Social History:  History  Alcohol Use  . 0.0 oz/week    Comment: Beer and Tequila on weekends.  3 beers and 4 shots of Tequila over 2 weekends per month     History  Drug Use  . Types: Marijuana    Comment: Marijuana:  starts and stops.  When smoking, daily.  Tested every 2 weeks due to taking prescription drug abuse--stopped in [place].  Goes throught 05/2016. Has used MJ since 6th grade.  Went to [employer] a lot daily.    Social History   Social History  . Marital status: Single    Spouse name: N/A  . Number of children: 0  . 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 Smoking status: Former Smoker    Packs/day: 0.00  . Smokeless tobacco: Never Used  . Alcohol use 0.0 oz/week     Comment: Beer and Tequila on weekends.  3 beers and 4 shots of Tequila over 2 weekends per month  . Drug use: Yes    Types: Marijuana     Comment: Marijuana:  starts and stops.  When smoking, daily.  Tested every 2 weeks due to taking prescription drug abuse--stopped in [place].  Goes throught 05/2016. Has used MJ since 6th grade.  Went to [employer] a lot daily.  Marland Kitchen Sexual activity: Yes    Partners: Female    Birth control/ protection: Condom   Other Topics Concern  . None   Social History Narrative   Originally from [place]   Came to [employer]. When 21 years old.     Lives with parents and 80 yo sister.     Hospital Course:  Patient is 21 year old male who is admitted to behavioral [facility] due to having manic episode and psychosis.  He's been noncompliant with his injection the past few months and slowly decompensating.  His been easily irritable and labile.  He lives with his parents but lately he has difficulty doing his job. Patient endorsed poor sleep, irritability, racing thoughts but denies any suicidal thoughts or homicidal thought.  Patient was seeing psychiatrist at [facility] but stopped treatment a few months ago.  He is guarded, paranoid and minimally  cooperative.  He has been not going to work for one week.  At times he appeared responding to internal stimuli.  After some encouragement he is willing to go back on Invega injection.  After the above admission assessment and during this hospital course, patients presenting symptoms were identified. Labs were reviewed and Prolactin slightly elevated 31.5 and LDL 131. Patient advised to follow-up with primary care physician for further evaluation of labs. His UDS was negative. Patient was admitted for reasons as noted above. Patient was treated and discharged with the following  medications; Invega sustenna 156 mg IM q28 days, Celexa 5 mg po daily for affective sx, Vistaril 25 mg po q6 hours as needed for anxiety, and  Trazodone 50 mg po qhs for sleep. He remained compliant with therapeutic milieu and actively participated in group counseling sessions. While on the unit, patient was able to verbalize learned coping skills for better management of depression and suicidal thoughts prior to his discharge home.   During the course of his hospitalization, at times, he did  exhibit bizarre behaviors sometimes manic symptoms although symptoms did improve with medication administration. Improvement was monitored by observation and patients daily report of symptom reduction, presentation of good affect,and overall improvement in mood & behavior.  Patient tolerated her treatment regimen without any adverse effects reported.  Patient case was presented during treatment team meeting this morning. The team members were all in agreement that patient was both mentally & medically stable to be discharged to continue mental health care on an outpatient basis as noted below. He was provided with all the necessary information needed to make this appointment without problems.  Upon discharge, patient denied any SI/HI, AVH, delusional thoughts, or paranoia. He was provided with a 7 day sample of his [facility] medications. He is to follow-up with his outpatient provider for refills. [facility] all personal belongings in no apparent distress. Transportation per patients arrangement.   Physical Findings: AIMS: Facial and Oral Movements Muscles of Facial Expression: None, normal Lips and Perioral Area: None, normal Jaw: None, normal Tongue: None, normal,Extremity Movements Upper (arms, wrists, hands, fingers): None, normal Lower (legs, knees, ankles, toes): None, normal, Trunk Movements Neck, shoulders, hips: None, normal, Overall Severity Severity of abnormal movements (highest score  from questions above): None, normal Incapacitation due to abnormal movements: None, normal Patient's awareness of abnormal movements (rate only patient's report): No Awareness, Dental Status Current problems with teeth and/or dentures?: No Does patient usually wear dentures?: No  CIWA:    COWS:     Musculoskeletal: Strength & Muscle Tone: within normal limits Gait & Station: normal Patient leans: N/A  Psychiatric Specialty Exam: SEE SRA BY MD Physical Exam  Nursing note and vitals reviewed. Constitutional: He is oriented to person, place, and time.  Neurological: He is alert and oriented to person, place, and time.    Review of Systems  Psychiatric/Behavioral: Positive for depression (stable) and hallucinations (improved with medication). Negative for memory loss, substance abuse and suicidal ideas. The patient is nervous/anxious (stable) and has insomnia (stable ).   All other systems reviewed and are negative.   Blood pressure 116/74, pulse (!) 101, temperature 98.7 F (37.1 C), temperature source Oral, resp. rate 20, height 5\' 7"  (1.702 m), weight 170 lb (77.1 kg), SpO2 100 %.Body mass index is 26.63 kg/m.   Have you used any form of tobacco in the last 30 days? (Cigarettes, Smokeless Tobacco, Cigars, and/or Pipes): No  Has this patient used any  form of tobacco in the last 30 days? (Cigarettes, Smokeless Tobacco, Cigars, and/or Pipes) N/A  Blood Alcohol level:  Lab Results  Component Value Date   ETH <5 03/15/2016    Metabolic Disorder Labs:  Lab Results  Component Value Date   HGBA1C 5.4 06/01/2017   MPG 108 06/01/2017   Lab Results  Component Value Date   PROLACTIN 31.5 (H) 06/01/2017   PROLACTIN 34.6 (H) 03/17/2016   Lab Results  Component Value Date   CHOL 182 06/01/2017   TRIG 75 06/01/2017   HDL 36 (L) 06/01/2017   CHOLHDL 5.1 06/01/2017   VLDL 15 06/01/2017   LDLCALC 131 (H) 06/01/2017   LDLCALC 106 03/01/2016    See Psychiatric Specialty Exam and  Suicide Risk Assessment completed by Attending Physician prior to discharge.  Discharge destination:  Home  Is patient on multiple antipsychotic therapies at discharge:  No   Has Patient had three or more failed trials of antipsychotic monotherapy by history:  No  Recommended Plan for Multiple Antipsychotic Therapies: NA   Allergies as of 06/07/2017   No Known Allergies     Medication List    TAKE these medications     Indication  citalopram 10 MG tablet Commonly known as:  CELEXA Take 0.5 tablets (5 mg total) by mouth daily. Start taking on:  06/08/2017  Indication:  affective symptoms   hydrOXYzine 25 MG tablet Commonly known as:  ATARAX/VISTARIL Take 1 tablet (25 mg total) by mouth 3 (three) times daily as needed for anxiety.  Indication:  Anxiety Neurosis   paliperidone 156 MG/ML Susp injection Commonly known as:  INVEGA SUSTENNA Inject 1 mL (156 mg total) into the muscle every 28 (twenty-eight) days. Start taking on:  07/03/2017 What changed:  medication strength  how much to take  Indication:  Mood stabilization   traZODone 50 MG tablet Commonly known as:  DESYREL Take 1 tablet (50 mg total) by mouth at bedtime and may repeat dose one time if needed. For insomnia  Indication:  Trouble Sleeping      Follow-up Information    [facility] Follow up on 06/10/2017.   Specialty:  Behavioral Health Why:  Monday morning at 8:00am for your hospital discharge appointment. Please arrive 15 minutes early to start your paperwork.  [address] [place] [address] [phone]           Follow-up recommendations: Follow up with your outpatient provided for any medical issues. Activity & diet as recommended by your primary care provider.  Comments:  Patient is instructed prior to discharge to: Take all medications as prescribed by his/her mental healthcare provider. Report any adverse effects and or reactions from the medicines to his/her  outpatient provider promptly. Patient has been instructed & cautioned: To not engage in alcohol and or illegal drug use while on prescription medicines. In the event of worsening symptoms, patient is instructed to call the crisis hotline, 911 and or go to the nearest ED for appropriate evaluation and treatment of symptoms. To follow-up with his/her primary care provider for your other medical issues, concerns and or health care needs.  Signed: Denzil Magnuson, NP 06/07/2017, 11:12 AM

## 2017-06-07 NOTE — BHH Suicide Risk Assessment (Signed)
[facility]   Principal Problem: Severe manic bipolar 1 disorder with psychotic behavior [facility](HCC) Discharge Diagnoses:  Patient Active Problem List   Diagnosis Date Noted  . Severe manic bipolar 1 disorder with psychotic behavior (HCC) [F31.2] 06/03/2017  . Tobacco use disorder [F17.200] 03/19/2016  . Cannabis use disorder, mild, abuse [F12.10] 03/18/2016    Total Time spent with patient: 30 minutes  Musculoskeletal: Strength & Muscle Tone: within normal limits Gait & Station: normal Patient leans: N/A  Psychiatric Specialty Exam: ROS  Blood pressure 116/74, pulse (!) 101, temperature 98.7 F (37.1 C), temperature source Oral, resp. rate 20, height 5\' 7"  (1.702 m), weight 77.1 kg (170 lb), SpO2 100 %.Body mass index is 26.63 kg/m.  General Appearance: Casual  Eye Contact::  Good  Speech:  Clear and Coherent409  Volume:  Normal  Mood:  Euthymic  Affect:  Appropriate and Congruent  Thought Process:  Coherent and Goal Directed  Orientation:  Full (Time, Place, and Person)  Thought Content:  WDL  Suicidal Thoughts:  No  Homicidal Thoughts:  No  Memory:  Immediate;   Good Recent;   Fair Remote;   Fair  Judgement:  Intact  Insight:  Fair  Psychomotor Activity:  Normal  Concentration:  Good  Recall:  Fair  Fund of Knowledge:Good  Language: Good  Akathisia:  Negative  Handed:  Right  AIMS (if indicated):     Assets:  Communication Skills Desire for Improvement Housing Leisure Time Physical Health Resilience Social Support Transportation  Sleep:  Number of Hours: 6  Cognition: WNL  ADL's:  Intact   Mental Status Per Nursing Assessment::   On Admission:  NA  Demographic Factors:  Male and Low socioeconomic status  Loss Factors: NA  Historical Factors: Family history of mental illness or substance abuse and Impulsivity  Risk Reduction Factors:   Sense of responsibility to family, Religious beliefs about death, Living with another  person, especially a relative, Positive social support, Positive therapeutic relationship and Positive coping skills or problem solving skills  Continued Clinical Symptoms:  Bipolar Disorder:   Mixed State Alcohol/Substance Abuse/Dependencies  Cognitive Features That Contribute To Risk:  Polarized thinking    Suicide Risk:  Minimal: No identifiable suicidal ideation.  Patients presenting with no risk factors but with morbid ruminations; may be classified as minimal risk based on the severity of the depressive symptoms  Follow-up Information    [facility] Follow up on 06/10/2017.   Specialty:  Behavioral Health Why:  Monday morning at 8:00am for your hospital discharge appointment. Please arrive 15 minutes early to start your paperwork.  Contact information: [address] [place] [address] [phone]           Plan Of Care/Follow-up recommendations:  Activity:  as tolerated Diet:  regular  Leata MouseJANARDHANA Brittian Renaldo, MD 06/07/2017, 12:10 PM

## 2017-06-07 NOTE — Progress Notes (Signed)
  D: Pt at time of assessment denied anxiety, depression, pain, AVH, HI or SI; states, "please let the doctor know that I am ready to go; I have been her for more than 5 day and I'm doing much better" Pt was restless and withdrawn to room for most of the evening.  A: Medications offered as prescribed. All patient's questions and concerns addressed. Support, encouragement, and safe environment provided.15-minute safety checks continue. R: Pt refused 2200 medication. Pt did not attend karaoke group. Safety checks continue.

## 2017-12-07 ENCOUNTER — Encounter: Payer: Self-pay | Admitting: Emergency Medicine

## 2017-12-07 ENCOUNTER — Other Ambulatory Visit: Payer: Self-pay

## 2017-12-07 ENCOUNTER — Ambulatory Visit (HOSPITAL_COMMUNITY)
Admission: RE | Admit: 2017-12-07 | Discharge: 2017-12-07 | Disposition: A | Discharge status: Home / Self Care | Payer: No Typology Code available for payment source | Attending: Psychiatry | Admitting: Psychiatry

## 2017-12-07 ENCOUNTER — Emergency Department
Admission: EM | Admit: 2017-12-07 | Discharge: 2017-12-07 | Disposition: A | Discharge status: Home / Self Care | Payer: No Typology Code available for payment source | Attending: Emergency Medicine | Admitting: Emergency Medicine

## 2017-12-07 DIAGNOSIS — F209 Schizophrenia, unspecified: Secondary | ICD-10-CM | POA: Insufficient documentation

## 2017-12-07 DIAGNOSIS — Z79899 Other long term (current) drug therapy: Secondary | ICD-10-CM | POA: Insufficient documentation

## 2017-12-07 DIAGNOSIS — Z87891 Personal history of nicotine dependence: Secondary | ICD-10-CM | POA: Insufficient documentation

## 2017-12-07 DIAGNOSIS — F29 Unspecified psychosis not due to a substance or known physiological condition: Secondary | ICD-10-CM

## 2017-12-07 HISTORY — DX: Bipolar disorder, unspecified: F31.9

## 2017-12-07 LAB — URINE DRUG SCREEN, QUALITATIVE (ARMC ONLY)
Amphetamines, Ur Screen: NOT DETECTED
BARBITURATES, UR SCREEN: NOT DETECTED
Benzodiazepine, Ur Scrn: NOT DETECTED
CANNABINOID 50 NG, UR ~~LOC~~: NOT DETECTED
Cocaine Metabolite,Ur ~~LOC~~: NOT DETECTED
MDMA (Ecstasy)Ur Screen: NOT DETECTED
METHADONE SCREEN, URINE: NOT DETECTED
Opiate, Ur Screen: NOT DETECTED
Phencyclidine (PCP) Ur S: NOT DETECTED
TRICYCLIC, UR SCREEN: NOT DETECTED

## 2017-12-07 LAB — CBC
HCT: 47.2 % (ref 40.0–52.0)
Hemoglobin: 15.8 g/dL (ref 13.0–18.0)
MCH: 31.2 pg (ref 26.0–34.0)
MCHC: 33.5 g/dL (ref 32.0–36.0)
MCV: 93 fL (ref 80.0–100.0)
PLATELETS: 270 10*3/uL (ref 150–440)
RBC: 5.07 MIL/uL (ref 4.40–5.90)
RDW: 13.3 % (ref 11.5–14.5)
WBC: 5.4 10*3/uL (ref 3.8–10.6)

## 2017-12-07 LAB — COMPREHENSIVE METABOLIC PANEL
ALBUMIN: 5 g/dL (ref 3.5–5.0)
ALK PHOS: 66 U/L (ref 38–126)
ALT: 21 U/L (ref 17–63)
ANION GAP: 7 (ref 5–15)
AST: 20 U/L (ref 15–41)
BILIRUBIN TOTAL: 1.2 mg/dL (ref 0.3–1.2)
BUN: 14 mg/dL (ref 6–20)
CALCIUM: 9.4 mg/dL (ref 8.9–10.3)
CO2: 25 mmol/L (ref 22–32)
Chloride: 106 mmol/L (ref 101–111)
Creatinine, Ser: 0.91 mg/dL (ref 0.61–1.24)
GFR calc Af Amer: >60 mL/min (ref >60)
GLUCOSE: 110 mg/dL — AB (ref 65–99)
POTASSIUM: 4 mmol/L (ref 3.5–5.1)
Sodium: 138 mmol/L (ref 135–145)
TOTAL PROTEIN: 8.5 g/dL — AB (ref 6.5–8.1)

## 2017-12-07 LAB — SALICYLATE LEVEL

## 2017-12-07 LAB — ETHANOL

## 2017-12-07 LAB — ACETAMINOPHEN LEVEL

## 2017-12-07 MED ORDER — OLANZAPINE 5 MG PO TABS: 5.0000 mg | ORAL_TABLET | Freq: Every day | ORAL | 0 refills | Status: DC

## 2017-12-07 NOTE — ED Notes (Signed)
21 year old male presented to the unit due to experiencing auditory hallucinations with paranoia. Patient stated that his parents brought him to the ED because he was acting "strange" and having some deep thoughts. Patient informed this Clinical research associatewriter that he stopped taking his Invega injections about 2 months ago because he didn't like the side effect of weight gain and feeling restless. He denies current SI/HI/AVH, pain and depression. States, "No I can't hear the voices now, I'm just sleepy."  [facility] via the [place]. Nursing assessment completed, oriented to environment, food and nutrition offered to patient. VS WNL upon admit, will continue to monitor patient.

## 2017-12-07 NOTE — ED Provider Notes (Signed)
[facility] Emergency Department Provider Note  ____________________________________________   First MD Initiated Contact with Patient 12/07/17 [phone]     (approximate)  I have reviewed the triage vital signs and the nursing notes.   HISTORY  Chief Complaint Psychiatric Evaluation   HPI Todd [name] is a 21 y.o. male with history of bipolar disorder as well as schizophrenia who is presenting to the emergency department with hallucinations.  The patient says that over the past week he has began hearing voices.  The voices say vague things that he is unable to completely understand.  He denies any suicidal or homicidal ideation.  Denies any intentional overdose.  Says that he stopped taking his medications about 2 months ago secondary to side effects.   Past Medical History:  Diagnosis Date  . Bipolar 1 disorder (HCC)   . Schizophrenia [facility](HCC)     Patient Active Problem List   Diagnosis Date Noted  . Severe manic bipolar 1 disorder with psychotic behavior (HCC) 06/03/2017  . Tobacco use disorder 03/19/2016  . Cannabis use disorder, mild, abuse 03/18/2016    History reviewed. No pertinent surgical history.  Prior to Admission medications   Medication Sig Start Date End Date Taking? Authorizing Provider  citalopram (CELEXA) 10 MG tablet Take 0.5 tablets (5 mg total) by mouth daily. 06/08/17   Denzil Magnusonhomas, Lashunda, NP  hydrOXYzine (ATARAX/VISTARIL) 25 MG tablet Take 1 tablet (25 mg total) by mouth 3 (three) times daily as needed for anxiety. 06/07/17   Denzil Magnusonhomas, Lashunda, NP  paliperidone (INVEGA SUSTENNA) 156 MG/ML SUSP injection Inject 1 mL (156 mg total) into the muscle every 28 (twenty-eight) days. 07/03/17   Denzil Magnusonhomas, Lashunda, NP  traZODone (DESYREL) 50 MG tablet Take 1 tablet (50 mg total) by mouth at bedtime and may repeat dose one time if needed. For insomnia 06/07/17   Denzil Magnusonhomas, Lashunda, NP    Allergies Patient has no known  allergies.  Family History  Problem Relation Age of Onset  . Hyperlipidemia Father   . Diabetes Maternal Grandmother   . Alcohol abuse Paternal Grandfather     Social History Social History   Tobacco Use  . Smoking status: Former Smoker    Packs/day: 0.00  . Smokeless tobacco: Never Used  Substance Use Topics  . Alcohol use: Yes    Alcohol/week: 0.0 oz    Comment: Beer and Tequila on weekends.  3 beers and 4 shots of Tequila over 2 weekends per month  . Drug use: Yes    Types: Marijuana    Comment: Marijuana:  starts and stops.  When smoking, daily.  Tested every 2 weeks due to taking prescription drug abuse--stopped in [place].  Goes throught 05/2016. Has used MJ since 6th grade.  Went to [employer]--smoked a lot daily.    Review of Systems  Constitutional: No fever/chills Eyes: No visual changes. ENT: No sore throat. Cardiovascular: Denies chest pain. Respiratory: Denies shortness of breath. Gastrointestinal: No abdominal pain.  No nausea, no vomiting.  No diarrhea.  No constipation. Genitourinary: Negative for dysuria. Musculoskeletal: Negative for back pain. Skin: Negative for rash. Neurological: Negative for headaches, focal weakness or numbness.   ____________________________________________   PHYSICAL EXAM:  VITAL SIGNS: ED Triage Vitals [12/07/17 0934]  Enc Vitals Group     BP 113/70     Pulse Rate 77     Resp 16     Temp 97.7 F (36.5 C)     Temp Source Oral  SpO2 98 %     Weight 195 lb (88.5 kg)     Height 5\' 7"  (1.702 m)     Head Circumference      Peak Flow      Pain Score 6     Pain Loc      Pain Edu?      Excl. in GC?     Constitutional: Alert and oriented. Well appearing and in no acute distress.  Does not appear to be responding to internal stimulation. Eyes: Conjunctivae are normal.  Head: Atraumatic. Nose: No congestion/rhinnorhea. Mouth/Throat: Mucous membranes are moist.  Neck: No stridor.   Cardiovascular: Normal  rate, regular rhythm. Grossly normal heart sounds.   Respiratory: Normal respiratory effort.  No retractions. Lungs CTAB. Gastrointestinal: Soft and nontender. No distention.  Musculoskeletal: No lower extremity tenderness nor edema.  No joint effusions. Neurologic:  Normal speech and language. No gross focal neurologic deficits are appreciated. Skin:  Skin is warm, dry and intact. No rash noted. Psychiatric: Mood and affect are normal. Speech and behavior are normal.  ____________________________________________   LABS (all labs ordered are listed, but only abnormal results are displayed)  Labs Reviewed  COMPREHENSIVE METABOLIC PANEL - Abnormal; Notable for the following components:      Result Value   Glucose, Bld 110 (*)    Total Protein 8.5 (*)    All other components within normal limits  ACETAMINOPHEN LEVEL - Abnormal; Notable for the following components:   Acetaminophen (Tylenol), Serum <10 (*)    All other components within normal limits  ETHANOL  CBC  URINE DRUG SCREEN, QUALITATIVE ([facility] ONLY)  SALICYLATE LEVEL   ____________________________________________  EKG   ____________________________________________  RADIOLOGY   ____________________________________________   PROCEDURES  Procedure(s) performed:   Procedures  Critical Care performed:   ____________________________________________   INITIAL IMPRESSION / ASSESSMENT AND PLAN / ED COURSE  Pertinent labs & imaging results that were available during my care of the patient were reviewed by me and considered in my medical decision making (see chart for details).  DDX: Psychosis, hallucinations, substance abuse, alcohol abuse As part of my medical decision making, I reviewed the following data within the electronic MEDICAL RECORD NUMBER Notes from prior ED visits  Patient will be placed under involuntary commitment and will be evaluated by psychiatry.  The patient is understanding of this plan willing to  comply.     ____________________________________________   FINAL CLINICAL IMPRESSION(S) / ED DIAGNOSES  Psychosis    NEW MEDICATIONS STARTED DURING THIS VISIT:  This SmartLink is deprecated. Use AVSMEDLIST instead to display the medication list for a patient.   Note:  This document was prepared using Dragon voice recognition software and may include unintentional dictation errors.     Myrna BlazerSchaevitz, David Matthew, MD 12/07/17 1140

## 2017-12-07 NOTE — ED Provider Notes (Signed)
Patient has been seen and cleared for discharge by psychiatry, they recommended discontinuing his [place].  In addition I have written him for the recommended Zyprexa 5 mg nightly tablets.  His mother is here to pick him up   Sharyn CreamerQuale, Mark, MD 12/07/17 [phone]

## 2017-12-07 NOTE — ED Notes (Signed)
Pt dressed out by this RN, [employer] ED tech and [employer] UtilitiesKyrstin, Airline pilotMT-P student. [name] shoes, socks, underwear, pants, and shirt placed in patient belongs bag.  Pt wallet and cell phone given to patients parents to take home.

## 2017-12-07 NOTE — Discharge Instructions (Signed)

## 2017-12-07 NOTE — BHH Counselor (Signed)
Before the Pt could be assessed by TTS the [facility].  Wolfgang PhoenixBrandi Jacquette Canales, [facility] Triage Specialist

## 2017-12-07 NOTE — ED Triage Notes (Signed)
Pt to ED via POV, pt voluntary at this time. Pt states that he has been to "rehab" twice in the past for hallucinations. Pt states that over the past week he has been hearing voices and seeing things that are not there. Pt states that he has been having a feeling being paranoid. Pt was on invega injections but that he stopped those about 2 months ago due to side effects. Pt currently calm and cooperative in triage. Pt in NAD at this time.

## 2017-12-07 NOTE — BH Assessment (Signed)
Assessment Note  Todd [name] is an 21 y.o. male with history of bipolar disorder as well as schizophrenia who is presenting to the emergency department with hallucinations. The patient says that over the past week he has began hearing voices - "I was feeling like I was getting attacked like I was hallucinating". The voices say vague things that he is unable to completely understand; however, he denied that these were command hallucinations. He denies any suicidal or homicidal ideation. Denies any intentional overdose. Says that he stopped taking his medications Hinda Glatter(Invega) about 2 months ago secondary to side effects. Pt reports [facility]. He reports he smoked marijuana about 2 weeks ago - "2-3 hits from a joint". Pt has history of inpatient treatment for the same reasons he presents to the ED today. When asked by this writer why he [facility] and presented to [facility] he reported, "I didn't like the energy I felt there so I left".  Diagnosis: Bipolar 1 Disorder, by history  Past Medical History:  Past Medical History:  Diagnosis Date  . Bipolar 1 disorder (HCC)   . Schizophrenia (HCC)     History reviewed. No pertinent surgical history.  Family History:  Family History  Problem Relation Age of Onset  . Hyperlipidemia Father   . Diabetes Maternal Grandmother   . Alcohol abuse Paternal Grandfather     Social History:  reports that he has quit smoking. He smoked 0.00 packs per day. he has never used smokeless tobacco. He reports that he drinks alcohol. He reports that he uses drugs. Drug: Marijuana.  Additional Social History:  Alcohol / Drug Use Pain Medications: see  MAR Prescriptions: see MAR Over the Counter: see MAR History of alcohol / drug use?: Yes Longest period of sobriety (when/how long): 2-3 months Negative Consequences of Use: Legal Substance #1 Name of Substance 1: Marijuana 1 - Age of First  Use: 12 1 - Amount (size/oz): "2-3 hits from a joint" 1 - Frequency: "not frequent" 1 - Duration: UKN 1 - Last Use / Amount: 2 weeks ago  CIWA: CIWA-Ar BP: (!) 124/56 Pulse Rate: 84 COWS:    Allergies: No Known Allergies  Home Medications:  (Not in a hospital admission)  OB/GYN Status:  No LMP for male patient.  General Assessment Data TTS Assessment: In system Is this a Tele or Face-to-Face Assessment?: Face-to-Face Is this an Initial Assessment or a Re-assessment for this encounter?: Initial Assessment Marital status: Single Is patient pregnant?: No Pregnancy Status: No Living Arrangements: Children Can pt return to current living arrangement?: Yes Admission Status: Voluntary Is patient capable of signing voluntary admission?: Yes Referral Source: Self/Family/Friend Insurance type: [employer] Discount  Medical Screening Exam [facility] Walk-in ONLY) Medical Exam completed: Yes  Crisis Care Plan Living Arrangements: Children Legal Guardian: Other:(Self) Name of Psychiatrist: [facility] Name of Therapist: None  Education Status Is patient currently in school?: No Current Grade: N/A Highest grade of school patient has completed: High School Name of school: N/A Contact person: N/A  Risk to self with the past 6 months Suicidal Ideation: No Has patient been a risk to self within the past 6 months prior to admission? : No Suicidal Intent: No Has patient had any suicidal intent within the past 6 months prior to admission? : No Is patient at risk for suicide?: No Suicidal Plan?: No Has patient had any suicidal plan within the past 6 months prior to admission? : No  Access to Means: No What has been your use of drugs/alcohol within the last 12 months?: Cannabis Previous Attempts/Gestures: No How many times?: 0 Other Self Harm Risks: None Triggers for Past Attempts: None known Intentional Self Injurious Behavior: None Family Suicide History: Unknown Recent stressful life  event(s): Other (Comment)(Hallucinations) Persecutory voices/beliefs?: Yes Depression: Yes Depression Symptoms: Fatigue, Insomnia Substance abuse history and/or treatment for substance abuse?: Yes Suicide prevention information given to non-admitted patients: Not applicable  Risk to Others within the past 6 months Homicidal Ideation: No Does patient have any lifetime risk of violence toward others beyond the six months prior to admission? : No Thoughts of Harm to Others: No Current Homicidal Intent: No Current Homicidal Plan: No Access to Homicidal Means: No Identified Victim: None History of harm to others?: No Assessment of Violence: None Noted Violent Behavior Description: None Does patient have access to weapons?: No Criminal Charges Pending?: No Does patient have a court date: No Is patient on probation?: No  Psychosis Hallucinations: Auditory Delusions: Persecutory  Mental Status Report Appearance/Hygiene: In scrubs, In hospital gown Eye Contact: Good Motor Activity: Freedom of movement Speech: Logical/coherent Level of Consciousness: Alert Mood: Depressed Affect: Appropriate to circumstance Anxiety Level: None Thought Processes: Coherent, Relevant Judgement: Unimpaired Orientation: Person, Place, Time, Situation, Appropriate for developmental age Obsessive Compulsive Thoughts/Behaviors: None  Cognitive Functioning Concentration: Normal Memory: Recent Intact, Remote Intact IQ: Average Insight: Fair Impulse Control: Fair Appetite: Fair Weight Loss: 0 Weight Gain: 0 Sleep: No Change Total Hours of Sleep: 8 Vegetative Symptoms: None  ADLScreening [facility] Assessment Services) Patient's cognitive ability adequate to safely complete daily activities?: Yes Patient able to express need for assistance with ADLs?: Yes Independently performs ADLs?: Yes (appropriate for developmental age)  Prior Inpatient Therapy Prior Inpatient Therapy: Yes Prior Therapy Dates:  05/2016; 05/2017 [facility] Reason for Treatment: Biplolar with psychotic features  Prior Outpatient Therapy Prior Outpatient Therapy: Yes Prior Therapy Dates: Current Prior Therapy Facilty/Provider(s): [facility] Reason for Treatment: Bipolar; medication management Does patient have an ACCT team?: No Does patient have Intensive In-House Services?  : No Does patient have [facility] services? : Yes Does patient have P4CC services?: No  ADL Screening (condition at time of admission) Patient's cognitive ability adequate to safely complete daily activities?: Yes Patient able to express need for assistance with ADLs?: Yes Independently performs ADLs?: Yes (appropriate for developmental age)       Abuse/Neglect Assessment (Assessment to be complete while patient is alone) Abuse/Neglect Assessment Can Be Completed: Yes Physical Abuse: Denies Verbal Abuse: Denies Sexual Abuse: Denies Exploitation of patient/patient's resources: Denies Self-Neglect: Denies Values / Beliefs Cultural Requests During Hospitalization: None Spiritual Requests During Hospitalization: None Consults Spiritual Care Consult Needed: No Social Work Consult Needed: No Merchant navy officerAdvance Directives (For Healthcare) Does Patient Have a Medical Advance Directive?: No Would patient like information on creating a medical advance directive?: No - Patient declined    Additional Information 1:1 In Past 12 Months?: No CIRT Risk: No Elopement Risk: No Does patient have medical clearance?: Yes  Child/Adolescent Assessment Running Away Risk: Denies(Patient is an adult)  Disposition:  Disposition Initial Assessment Completed for this Encounter: Yes Disposition of Patient: Pending Review with psychiatrist(Pending SOC)  On Site Evaluation by:   Reviewed with Physician:    Wilmon ArmsSTEVENSON, Kippy Gohman 12/07/2017 5:18 PM

## 2018-01-16 ENCOUNTER — Ambulatory Visit: Payer: Self-pay | Admitting: Licensed Clinical Social Worker

## 2018-01-16 DIAGNOSIS — F439 Reaction to severe stress, unspecified: Secondary | ICD-10-CM
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
## 2018-01-23 ENCOUNTER — Ambulatory Visit: Payer: Self-pay | Admitting: Licensed Clinical Social Worker

## 2018-01-23 DIAGNOSIS — F29 Unspecified psychosis not due to a substance or known physiological condition: Secondary | ICD-10-CM

## 2018-01-30 ENCOUNTER — Other Ambulatory Visit: Payer: Self-pay | Admitting: Licensed Clinical Social Worker
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
## 2018-02-28 ENCOUNTER — Ambulatory Visit: Payer: Self-pay | Admitting: Internal Medicine

## 2018-02-28 DIAGNOSIS — F3110 Bipolar disorder, current episode manic without psychotic features, unspecified: Secondary | ICD-10-CM

## 2018-02-28 DIAGNOSIS — F29 Unspecified psychosis not due to a substance or known physiological condition: Secondary | ICD-10-CM

## 2018-02-28 MED ORDER — OLANZAPINE 5 MG PO TABS: 5.0000 mg | ORAL_TABLET | Freq: Every day | ORAL | 3 refills | Status: DC

## 2018-02-28 MED ORDER — FLUOXETINE HCL 10 MG PO TABS: ORAL_TABLET | ORAL | 3 refills | Status: DC

## 2018-02-28 NOTE — Progress Notes (Signed)
   Subjective:    Patient ID: Todd [name], male    DOB: 01/10/1996, 22 y.o.   MRN: [id number]  HPI   Home visit for patient who by both parents' histories is just staying in his room and very depressed.  He reportedly is not eating well also. Discussed concern that he is not taking his medications, which in December were switched to Fluoxetine 10 mg daily and Olanzapine 5 mg at bedtime. Not sleeping well.   He states he is not suicidal, denies homicidal thoughts as well. When asked if he was hearing voices or seeing things that might not actually be there, he asked examiner "don't you hear voices too?"  Then later stated he was not hearing voices. Denies illicit drug use.    No outpatient medications have been marked as taking for the 02/28/18 encounter (Appointment) with Julieanne MansonMulberry, Braylyn Kalter, MD.   No Known Allergies  Review of Systems     Objective:   Physical Exam Disheveled Eyes at times unfocused.  During conversation, would suddenly seem to focus on something else or become quite busy with movement, though not necessarily agitated with movements. Constantly becoming quite alert and looking out window.        Assessment & Plan:  Bipolar Disorder:  Concern for recurrent psychosis, though patient denies this currently. He makes a deal to start taking the Fluoxetine 10 mg in the morning and the Olanzepine 5 mg at bedtime. Mom will not be able to be home for a home visit in 1 week, but will check her calendar and get back. He is not interested in hospitalization again.

## 2018-03-13 ENCOUNTER — Ambulatory Visit: Payer: Self-pay | Admitting: Internal Medicine

## 2018-03-13 ENCOUNTER — Encounter: Payer: Self-pay | Admitting: Internal Medicine

## 2018-03-13 VITALS — BP 128/72 | HR 84 | Resp 12 | Ht 67.0 in | Wt 168.0 lb

## 2018-03-13 DIAGNOSIS — F3110 Bipolar disorder, current episode manic without psychotic features, unspecified: Secondary | ICD-10-CM

## 2018-03-13 NOTE — Patient Instructions (Signed)
I will call you on Monday.  Take your medication every day.  We made a deal.

## 2018-03-13 NOTE — Progress Notes (Signed)
   Subjective:    Patient ID: Edman CircleJuan Francisco Hernandez Avalos, male    DOB: 05/24/1996, 22 y.o.   MRN: [id number]  HPI  Unable to make appointment for last Friday as mom was not at home.  She brought him in with her today as she was coming for labs.  Bipolar Disorder:  Not taking Fluoxetine daily.  States he is taking it maybe 2 out of 7 days per week in past 2 weeks. Has not taken the Olanzapine at all since last seen.   He does not feel he needs his medication.  Denies suicidal or homicidal ideation.   Denies visual or auditory hallucination/hearing voices.  Mom denies any knowledge of comments by [name] that he is suicidal or homicidal, but they are worried about him holed up in his room.  Staying in room all the time.  Not conversing with parents.  Not eating much  No outpatient medications have been marked as taking for the 03/13/18 encounter (Office Visit) with Julieanne MansonMulberry, Emelynn Rance, MD.    No Known Allergies  Review of Systems     Objective:   Physical Exam  A bit disheveled.  Poor eye contact intermittently.  Busy with hands.  Asks questions and then seems distracted suddenly.  May look off in distance.  Denies he is hearing or seeing anything.   Able to pull his attention back, but need to do so repeatedly.       Assessment & Plan:  Bipolar I with psychosis vs schizophrenia:  Discussed with patient that he will end up back in hospital soon if he does not start taking medication regularly.  Discussed need to take both meds and allow his body to get used to them--the fatigue with the Olanzapine generally improves with time. He states he will, but not at all convinced. Will continue to check in weekly. [facility].

## 2018-03-17 ENCOUNTER — Other Ambulatory Visit: Payer: Self-pay | Admitting: Internal Medicine

## 2018-03-17 ENCOUNTER — Encounter: Payer: Self-pay | Admitting: Internal Medicine

## 2018-03-17 NOTE — Progress Notes (Deleted)
Phone call followup:  He had not taken the medication. Shared with him if he did not start taking care of himself, would recommend hospitalization.   He stated he was going to take the Fluoxetine 10 mg at the moment (1 p.m) and then take his Olanzapine at bedtime. Discussed I would call back again at same time tomorrow.

## 2018-03-20 ENCOUNTER — Ambulatory Visit (INDEPENDENT_AMBULATORY_CARE_PROVIDER_SITE_OTHER): Payer: Self-pay | Admitting: Internal Medicine

## 2018-03-20 ENCOUNTER — Encounter: Payer: Self-pay | Admitting: Internal Medicine

## 2018-03-20 VITALS — BP 122/72 | HR 78 | Resp 12 | Ht 67.0 in | Wt 169.0 lb

## 2018-03-20 DIAGNOSIS — F3110 Bipolar disorder, current episode manic without psychotic features, unspecified: Secondary | ICD-10-CM

## 2018-03-20 DIAGNOSIS — F29 Unspecified psychosis not due to a substance or known physiological condition: Secondary | ICD-10-CM

## 2018-03-20 NOTE — Progress Notes (Signed)
   Subjective:    Patient ID: Todd [name], male    DOB: 08/29/1996, 22 y.o.   MRN: [id number]  HPI   Bipolar Disorder:  States he is taking the Fluoxetine 10 mg in the morning.  He states he is also taking the Olanzapine in the night before bed.  The latter makes him sleepy. He states he has been taking the medication for the past 3 days.   Denies suicidal or homicidal ideation. Denies hearing voices.  Current Meds  Medication Sig  . FLUoxetine (PROZAC) 10 MG tablet 1 tab by mouth in the morning  . OLANZapine (ZYPREXA) 5 MG tablet Take 1 tablet (5 mg total) by mouth at bedtime.   No Known Allergies   Review of Systems     Objective:   Physical Exam Difficulty at times with eye contact.   Diffiulty focusing.   Somewhat disheveled.      Assessment & Plan:  Bipolar I with intermittent psychosis:  Patient appears to be telling me what he thinks I want to hear.  Suspect he is not taking meds.   Will check with his mother to get her perspective on how he is doing. Will do a telephone progress report with the two of them in a week if he allows. Discussed with patient would like to get him stable on meds to keep him out of the hospital.  He does agree with that plan. Would also like him to get back with [facility].   He will contemplate.  Difficult situation.

## 2018-03-25 ENCOUNTER — Ambulatory Visit: Payer: Self-pay | Admitting: Internal Medicine

## 2018-03-25 DIAGNOSIS — F3110 Bipolar disorder, current episode manic without psychotic features, unspecified: Secondary | ICD-10-CM

## 2018-03-25 NOTE — Progress Notes (Signed)
Called and spoke with patient States taking Fluoxetine in the morning and the Olanzapine at bedtime.   Feels he is doing well. No suicidal or homicidal ideation.  No auditory or visual hallucinations. States he went out of town this past weekend with his parents to [place] He did allow me to speak over the phone with his mom, Clemmie Krillmelda.  She states he is doing a bit better.  She states he is only taking the Fluoxetine. Discussed he could end up with more difficulties with mania if he does not take the Olanzapine at bedtime. She will work with him on this. I stated I would call again this Friday around the same time--2 p.m.

## 2018-03-28 ENCOUNTER — Ambulatory Visit: Payer: Self-pay | Admitting: Internal Medicine

## 2018-03-28 DIAGNOSIS — F3110 Bipolar disorder, current episode manic without psychotic features, unspecified: Secondary | ICD-10-CM

## 2018-03-28 NOTE — Progress Notes (Signed)
Telephone visit:  States he took the olanzapine twice this week in the evening and woke up late the next morning.   He is taking the Fluoxetine daily in the morning. He states he feels like he is going to get his life back. He does not know how his parents feel about how he is doing. No suicidal or homicidal ideation. Encouraged him to take his Olanzapine daily at night so his body can get accustomed to the med and he will not feel so sedated.

## 2018-04-03 ENCOUNTER — Ambulatory Visit: Payer: Self-pay | Admitting: Internal Medicine

## 2018-04-03 DIAGNOSIS — F3110 Bipolar disorder, current episode manic without psychotic features, unspecified: Secondary | ICD-10-CM

## 2018-04-05 ENCOUNTER — Other Ambulatory Visit: Payer: Self-pay

## 2018-04-05 ENCOUNTER — Encounter (HOSPITAL_COMMUNITY): Payer: Self-pay | Admitting: *Deleted

## 2018-04-05 ENCOUNTER — Emergency Department (HOSPITAL_COMMUNITY)
Admission: EM | Admit: 2018-04-05 | Discharge: 2018-04-07 | Disposition: A | Discharge status: Home / Self Care | Payer: Self-pay | Attending: Emergency Medicine | Admitting: Emergency Medicine

## 2018-04-05 DIAGNOSIS — F29 Unspecified psychosis not due to a substance or known physiological condition: Secondary | ICD-10-CM

## 2018-04-05 DIAGNOSIS — F312 Bipolar disorder, current episode manic severe with psychotic features: Secondary | ICD-10-CM | POA: Diagnosis present

## 2018-04-05 DIAGNOSIS — F1210 Cannabis abuse, uncomplicated: Secondary | ICD-10-CM | POA: Insufficient documentation

## 2018-04-05 DIAGNOSIS — Z79899 Other long term (current) drug therapy: Secondary | ICD-10-CM | POA: Insufficient documentation

## 2018-04-05 DIAGNOSIS — Z87891 Personal history of nicotine dependence: Secondary | ICD-10-CM | POA: Insufficient documentation

## 2018-04-05 DIAGNOSIS — R440 Auditory hallucinations: Secondary | ICD-10-CM | POA: Insufficient documentation

## 2018-04-05 LAB — RAPID URINE DRUG SCREEN, HOSP PERFORMED
Amphetamines: NOT DETECTED
BARBITURATES: NOT DETECTED
Benzodiazepines: NOT DETECTED
Cocaine: NOT DETECTED
OPIATES: NOT DETECTED
TETRAHYDROCANNABINOL: NOT DETECTED

## 2018-04-05 LAB — CBC WITH DIFFERENTIAL/PLATELET
BASOS ABS: 0 10*3/uL (ref 0.0–0.1)
BASOS PCT: 0 %
Eosinophils Absolute: 0 10*3/uL (ref 0.0–0.7)
Eosinophils Relative: 0 %
HEMATOCRIT: 45.6 % (ref 39.0–52.0)
HEMOGLOBIN: 16.3 g/dL (ref 13.0–17.0)
Lymphocytes Relative: 40 %
Lymphs Abs: 3.6 10*3/uL (ref 0.7–4.0)
MCH: 31.8 pg (ref 26.0–34.0)
MCHC: 35.7 g/dL (ref 30.0–36.0)
MCV: 88.9 fL (ref 78.0–100.0)
Monocytes Absolute: 0.7 10*3/uL (ref 0.1–1.0)
Monocytes Relative: 7 %
NEUTROS ABS: 4.6 10*3/uL (ref 1.7–7.7)
NEUTROS PCT: 53 %
Platelets: 283 10*3/uL (ref 150–400)
RBC: 5.13 MIL/uL (ref 4.22–5.81)
RDW: 12.5 % (ref 11.5–15.5)
WBC: 8.9 10*3/uL (ref 4.0–10.5)

## 2018-04-05 LAB — COMPREHENSIVE METABOLIC PANEL
ALK PHOS: 76 U/L (ref 38–126)
ALT: 22 U/L (ref 17–63)
AST: 17 U/L (ref 15–41)
Albumin: 5.1 g/dL — ABNORMAL HIGH (ref 3.5–5.0)
Anion gap: 12 (ref 5–15)
BUN: 12 mg/dL (ref 6–20)
CALCIUM: 9.8 mg/dL (ref 8.9–10.3)
CO2: 24 mmol/L (ref 22–32)
CREATININE: 1.02 mg/dL (ref 0.61–1.24)
Chloride: 106 mmol/L (ref 101–111)
GFR calc non Af Amer: >60 mL/min (ref >60)
GLUCOSE: 109 mg/dL — AB (ref 65–99)
Potassium: 3.6 mmol/L (ref 3.5–5.1)
SODIUM: 142 mmol/L (ref 135–145)
Total Bilirubin: 1.2 mg/dL (ref 0.3–1.2)
Total Protein: 8.5 g/dL — ABNORMAL HIGH (ref 6.5–8.1)

## 2018-04-05 LAB — ETHANOL: Alcohol, Ethyl (B): <10 mg/dL (ref <10)

## 2018-04-05 MED ORDER — FLUOXETINE HCL 10 MG PO CAPS
10.0000 mg | ORAL_CAPSULE | Freq: Every day | ORAL | Status: DC
  Administered 2018-04-05 – 2018-04-06 (×2): 10 mg via ORAL
  Filled 2018-04-05 (×2): qty 1

## 2018-04-05 MED ORDER — OLANZAPINE 5 MG PO TABS
5.0000 mg | ORAL_TABLET | Freq: Every day | ORAL | Status: DC
  Administered 2018-04-05: 5 mg via ORAL
  Filled 2018-04-05: qty 1

## 2018-04-05 MED ORDER — IBUPROFEN 200 MG PO TABS: 600.0000 mg | ORAL_TABLET | Freq: Three times a day (TID) | ORAL | Status: DC | PRN

## 2018-04-05 MED ORDER — ZOLPIDEM TARTRATE 5 MG PO TABS
5.0000 mg | ORAL_TABLET | Freq: Every evening | ORAL | Status: DC | PRN
  Administered 2018-04-05: 5 mg via ORAL
  Filled 2018-04-05: qty 1

## 2018-04-05 NOTE — BH Assessment (Signed)
Faxed clinical information to the following facilities for placement:  [facility] [address]   Mikenzie Mccannon Ellis Vessie Olmsted Jr, [facility], [facility], [facility] Triage Specialist [phone]
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
## 2018-04-05 NOTE — ED Notes (Signed)
Patient currently being assessed via telepsych by TTS in his room. Pt cooperative at this time, but appears guarded and hesitant to answer questions at times. [facility]

## 2018-04-05 NOTE — ED Notes (Signed)
Pt received in room 30. [facility].
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
## 2018-04-05 NOTE — ED Notes (Signed)
Patient denies SI/HI/AVH at this time. Plan of care discussed. Encouragement and support provided and safety maintain. Q 15 safety checks in place and video monitoring.
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
## 2018-04-05 NOTE — ED Notes (Signed)
Pt reminded that a urine sample is needed at this time. Pt provided a urine specimen cup and 2 glasses of water.

## 2018-04-05 NOTE — ED Notes (Signed)
Report given to [place], Charity fundraiserN. Security on unit to wand [facility].

## 2018-04-06 DIAGNOSIS — G4700 Insomnia, unspecified: Secondary | ICD-10-CM

## 2018-04-06 DIAGNOSIS — Z87891 Personal history of nicotine dependence: Secondary | ICD-10-CM

## 2018-04-06 DIAGNOSIS — R454 Irritability and anger: Secondary | ICD-10-CM

## 2018-04-06 DIAGNOSIS — R456 Violent behavior: Secondary | ICD-10-CM

## 2018-04-06 DIAGNOSIS — F312 Bipolar disorder, current episode manic severe with psychotic features: Secondary | ICD-10-CM

## 2018-04-06 DIAGNOSIS — Z881 Allergy status to other antibiotic agents status: Secondary | ICD-10-CM

## 2018-04-06 DIAGNOSIS — F1290 Cannabis use, unspecified, uncomplicated: Secondary | ICD-10-CM

## 2018-04-06 DIAGNOSIS — Z9114 Patient's other noncompliance with medication regimen: Secondary | ICD-10-CM

## 2018-04-06 DIAGNOSIS — R45851 Suicidal ideations: Secondary | ICD-10-CM

## 2018-04-06 DIAGNOSIS — R451 Restlessness and agitation: Secondary | ICD-10-CM

## 2018-04-06 DIAGNOSIS — R4587 Impulsiveness: Secondary | ICD-10-CM

## 2018-04-06 MED ORDER — CARBAMAZEPINE 200 MG PO TABS
200.0000 mg | ORAL_TABLET | Freq: Two times a day (BID) | ORAL | Status: DC
  Administered 2018-04-06 – 2018-04-07 (×2): 200 mg via ORAL
  Filled 2018-04-06 (×2): qty 1

## 2018-04-06 MED ORDER — RISPERIDONE 0.5 MG PO TABS
0.5000 mg | ORAL_TABLET | Freq: Two times a day (BID) | ORAL | Status: DC
  Administered 2018-04-06 – 2018-04-07 (×3): 0.5 mg via ORAL
  Filled 2018-04-06 (×3): qty 1

## 2018-04-06 NOTE — ED Notes (Signed)
Pt A&O x 3, calm & cooperative, sitting up at bedside watching TV at present.  Monitoring for safety, Q 15 min checks in effect.  No distress noted.

## 2018-04-06 NOTE — ED Notes (Signed)
Patient is resting at this time will obtain vitals when patient awakens

## 2018-04-06 NOTE — ED Notes (Signed)
Pt did not eat lunch nor did he eat his dinner tray. Pt voiced only wanting to eat graham crackers. RN notified.

## 2018-04-06 NOTE — ED Notes (Signed)
Pt stated,"got into an argument with my parents, family about me not taking my meds. I stop taking them." Writer informed, "he must notified doctor, before he stops taking any meds." He noted he understood. Pt question when he will go home. Pt has been calm, cooperative on shift. Staff will continue to monitor and maintain safety.

## 2018-04-06 NOTE — BH Assessment (Signed)
[facility]      Per Dr Jannifer FranklinAkintayo, patient is recommended for inpatient treatment into a 500 hall bed.

## 2018-04-06 NOTE — ED Notes (Signed)
Pt declined lunch tray, but requested graham crackers and peanut butter which he was provided.
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
## 2018-04-07 DIAGNOSIS — Z811 Family history of alcohol abuse and dependence: Secondary | ICD-10-CM

## 2018-04-07 DIAGNOSIS — F29 Unspecified psychosis not due to a substance or known physiological condition: Secondary | ICD-10-CM

## 2018-04-07 DIAGNOSIS — Z6379 Other stressful life events affecting family and household: Secondary | ICD-10-CM

## 2018-04-07 MED ORDER — RISPERIDONE 0.5 MG PO TABS: 0.5000 mg | ORAL_TABLET | Freq: Two times a day (BID) | ORAL | 0 refills | Status: DC

## 2018-04-07 MED ORDER — CARBAMAZEPINE 200 MG PO TABS: 200.0000 mg | ORAL_TABLET | Freq: Two times a day (BID) | ORAL | 0 refills | Status: DC

## 2018-04-07 NOTE — Discharge Instructions (Signed)
For your mental health needs, you are advised to continue treatment with [facility]: ° °     [facility] °     [address] °     [place], [place] [address] °     [phone] °

## 2018-04-07 NOTE — BH Assessment (Signed)
[facility]  Per Juanetta BeetsJacqueline Norman, DO, this pt does not require psychiatric hospitalization at this time.  Pt presents under IVC initiated by pt's father, which Dr Sharma CovertNorman has rescinded.  [facility]  This has been included in pt's discharge instructions.  [name] nurse, Angelique BlonderDenise, has been notified.  Doylene Canninghomas Shye Doty, MA Triage Specialist [phone]

## 2018-04-07 NOTE — ED Notes (Signed)
Patient denies pain and is resting comfortably.  

## 2018-04-07 NOTE — BHH Suicide Risk Assessment (Signed)
Suicide Risk Assessment  Discharge Assessment   [facility]   Principal Problem: Severe manic bipolar 1 disorder with psychotic behavior [facility](HCC) Discharge Diagnoses:  Patient Active Problem List   Diagnosis Date Noted  . Severe manic bipolar 1 disorder with psychotic behavior ([facility]) [F31.2] 06/03/2017  . Tobacco use disorder [F17.200] 03/19/2016  . Cannabis use disorder, mild, abuse [F12.10] 03/18/2016  . Psychosis ([facility]) [F29]     Total Time spent with patient: 30 minutes  Musculoskeletal: Strength & Muscle Tone: within normal limits Gait & Station: normal Patient leans: N/A  Psychiatric Specialty Exam: Physical Exam  Constitutional: He is oriented to person, place, and time. He appears well-developed and well-nourished.  HENT:  Head: Normocephalic.  Respiratory: Effort normal.  Musculoskeletal: Normal range of motion.  Neurological: He is alert and oriented to person, place, and time.  Psychiatric: His speech is normal and behavior is normal. Thought content normal. Cognition and memory are normal. He expresses impulsivity. He exhibits a depressed mood.   Review of Systems  Psychiatric/Behavioral: Positive for depression. Negative for hallucinations, memory loss, substance abuse and suicidal ideas. The patient is not nervous/anxious and does not have insomnia.   All other systems reviewed and are negative.  Blood pressure 123/60, pulse 68, temperature 98.7 F (37.1 C), temperature source Oral, resp. rate 16, SpO2 99 %.There is no height or weight on file to calculate BMI. 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 ADL's:  Intact Cognition:  WNL   Mental Status Per Nursing Assessment::   On Admission:   Aggressive behavior  Demographic Factors:  Male, Adolescent or young adult, Low socioeconomic status and Unemployed  Loss Factors: Financial problems/change in socioeconomic status  Historical Factors: Impulsivity  Risk Reduction Factors:   Sense of responsibility to family and Living with another person, especially a relative  Continued Clinical Symptoms:  Depression:   Impulsivity  Cognitive Features That Contribute To Risk:  Closed-mindedness    Suicide Risk:  Minimal: No identifiable suicidal ideation.  Patients presenting with no risk factors but with morbid ruminations; may be classified as minimal risk based on the severity of the depressive symptoms    Plan Of Care/Follow-up recommendations:  Activity:  as tolerated Diet:  Heart Healthy  Laveda AbbeLaurie Britton Nael Petrosyan, NP 04/07/2018, 11:12 AM
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
## 2018-04-10 ENCOUNTER — Encounter: Payer: Self-pay | Admitting: Internal Medicine

## 2018-04-10 NOTE — Progress Notes (Signed)
Called patient at designated time and he did not answer.   Not able to leave a voicemail. Spoke with mother later. She does not feel he is taking his medication. Ultimately, he came to phone and states he is and is doing fine. Denies suicidal or homicidal ideation. Will need to try to contact [facility] to see if other outpatient avenues following Easter Holiday.

## 2018-04-11 ENCOUNTER — Encounter: Payer: Self-pay | Admitting: Internal Medicine

## 2018-04-11 NOTE — Progress Notes (Signed)
Finally able to connect with Tyler AasMichelle Butler, psychiatric nurse practitioner at [facility]. She will check into the possibility of ACT Team for uninsured or perhaps a group home to monitor for a bit and get back to me when she is next back in the office next Wednesday.  (she is in [place])  Called and spoke with his mom about plans and would let her know after next Wednesday.

## 2018-04-23 ENCOUNTER — Telehealth: Payer: Self-pay | Admitting: Internal Medicine

## 2018-04-23 NOTE — Telephone Encounter (Signed)
Will call Todd [name] tomorrow during regular hours. Concerned that Teondre was apparently looking for a gun to purchase.

## 2018-04-24 ENCOUNTER — Telehealth: Payer: Self-pay | Admitting: Internal Medicine

## 2018-10-13 ENCOUNTER — Encounter (HOSPITAL_COMMUNITY): Payer: Self-pay | Admitting: Emergency Medicine

## 2018-10-13 ENCOUNTER — Emergency Department (HOSPITAL_COMMUNITY)
Admission: EM | Admit: 2018-10-13 | Discharge: 2018-10-13 | Disposition: A | Discharge status: Home / Self Care | Payer: Self-pay | Attending: Emergency Medicine | Admitting: Emergency Medicine

## 2018-10-13 DIAGNOSIS — R21 Rash and other nonspecific skin eruption: Secondary | ICD-10-CM

## 2018-10-13 DIAGNOSIS — Z7189 Other specified counseling: Secondary | ICD-10-CM

## 2018-10-13 DIAGNOSIS — F17210 Nicotine dependence, cigarettes, uncomplicated: Secondary | ICD-10-CM | POA: Insufficient documentation

## 2018-10-13 DIAGNOSIS — Z639 Problem related to primary support group, unspecified: Secondary | ICD-10-CM

## 2018-10-13 DIAGNOSIS — F319 Bipolar disorder, unspecified: Secondary | ICD-10-CM | POA: Insufficient documentation

## 2018-10-13 DIAGNOSIS — F209 Schizophrenia, unspecified: Secondary | ICD-10-CM | POA: Insufficient documentation

## 2018-10-13 MED ORDER — HYDROCORTISONE 0.5 % EX CREA: 1.0000 "application " | TOPICAL_CREAM | Freq: Two times a day (BID) | CUTANEOUS | 0 refills | Status: DC

## 2018-10-13 NOTE — ED Notes (Signed)
EDP PFEIFFER AND TTS DAVE PRESENT TO SPEAK WITH FAMILY. DISCUSSED EVALUATION WITH PT AND FAMILY AND REASONS FOR DISCHARGE. PT ABLE TO LEAVE.

## 2018-10-13 NOTE — ED Notes (Signed)
TTS AT BEDSIDE. DAVE SPEAKING WITH PT AND FAMILY

## 2018-10-13 NOTE — ED Notes (Signed)
Bed: ZO10 Expected date:  Expected time:  Means of arrival:  Comments: LEVEL 4

## 2018-10-13 NOTE — ED Triage Notes (Signed)
Patient here from home with complaints of "seeing bugs all over him".  Family reports hx of schizophrenia and states patient is not taking meds.

## 2018-10-13 NOTE — ED Notes (Signed)
PT DECLINES VITALS AT DISCHARGE. RX X 1 GIVEN

## 2018-10-13 NOTE — ED Provider Notes (Signed)
[facility]-EMERGENCY DEPT Provider Note   CSN: [id number] Arrival date & time: 10/13/18  1542     History   Chief Complaint Chief Complaint  Patient presents with  . Medical Clearance  . Hallucinations    HPI Todd [name] is a 22 y.o. male.  HPI Patient reports he is here just because he wanted to get a small rash looked at on his forearm.  He reports he just wanted to get some cream for it.  He states his mom and his sister are crazy and now that he is here they are trying to get him checked for things that he does not want looked at.  Patient is pretty vague about the concerns that he identifies and his mom and his sister.  He is oriented to person, place and time.  He does not appear to be responding to external stimuli.  No obvious hallucinations or tangential speech.  He denies any suicidal or homicidal intents.  Patient is mother and sister report they are very concerned because he left 3 months ago when he did not have any contact with them.  He reports that they were occasionally able to have contact and no that he was in [place].  Think that he was sometimes living in a homeless type of situation.  They are worried that he is abusing drugs.  They are pretty sure that he smokes marijuana.  She reports she seen some pills in his car but does not know of other drug specifically.  They deny that he has made any suicidal or homicidal statements.  Reports that he has seemed extra agitated and short tempered.  They are very concerned for his safety.  He would like to have him get blood work and urine checked.  They would also like it if he could get committed for treatment. Past Medical History:  Diagnosis Date  . Bipolar 1 disorder ([facility])   . Schizophrenia [facility])     Patient Active Problem List   Diagnosis Date Noted  . Severe manic bipolar 1 disorder with psychotic behavior ([facility]) 06/03/2017  . Tobacco use disorder 03/19/2016  . Cannabis  use disorder, mild, abuse 03/18/2016  . Psychosis ([facility])     History reviewed. No pertinent surgical history.      Home Medications    Prior to Admission medications   Medication Sig Start Date End Date Taking? Authorizing Provider  carbamazepine (TEGRETOL) 200 MG tablet Take 1 tablet (200 mg total) by mouth 2 (two) times daily after a meal. 04/07/18   Laveda Abbe, NP  FLUoxetine (PROZAC) 10 MG tablet 1 tab by mouth in the morning 02/28/18   Julieanne Manson, MD  hydrocortisone cream 0.5 % Apply 1 application topically 2 (two) times daily. 10/13/18   Arby Barrette, MD  OLANZapine (ZYPREXA) 5 MG tablet Take 1 tablet (5 mg total) by mouth at bedtime. 02/28/18 02/28/19  Julieanne Manson, MD  risperiDONE (RISPERDAL) 0.5 MG tablet Take 1 tablet (0.5 mg total) by mouth 2 (two) times daily. 04/07/18   Laveda Abbe, NP    Family History Family History  Problem Relation Age of Onset  . Hyperlipidemia Father   . Diabetes Maternal Grandmother   . Alcohol abuse Paternal Grandfather     Social History Social History   Tobacco Use  . Smoking status: Former Smoker    Packs/day: 0.00  . Smokeless tobacco: Never Used  Substance Use Topics  . Alcohol use: Yes  Alcohol/week: 0.0 standard drinks    Comment: Beer and Tequila on weekends.  3 beers and 4 shots of Tequila over 2 weekends per month  . Drug use: Yes    Types: Marijuana    Comment: Marijuana:  starts and stops.  When smoking, daily.  Tested every 2 weeks due to taking prescription drug abuse--stopped in [place].  Goes throught 05/2016. Has used MJ since 6th grade.  Went to [employer] a lot daily.     Allergies   Patient has no known allergies.   Review of Systems Review of Systems 10 Systems reviewed and are negative for acute change except as noted in the HPI.   Physical Exam Updated Vital Signs BP 110/78 (BP Location: Left Arm)   Pulse 81   Temp 98.8 F (37.1 C) (Oral)   Resp 18    SpO2 96%   Physical Exam  Constitutional: He is oriented to person, place, and time.  Patient is well-nourished and well-developed.  He is fairly well groomed.  He does not appear disheveled or poorly maintained.  He is pacing somewhat in the room.  He however stops and stands facing me directly in carries on a conversation without appearance of distraction.  HENT:  Head: Normocephalic and atraumatic.  Mouth/Throat: Oropharynx is clear and moist.  Eyes: EOM are normal.  Cardiovascular: Normal rate, regular rhythm, normal heart sounds and intact distal pulses.  Pulmonary/Chest: Effort normal and breath sounds normal.  Abdominal: Soft. He exhibits no distension. There is no tenderness.  Musculoskeletal: Normal range of motion.  Patient standing walking with nonantalgic gait.  Both arms are well-developed.  No wounds or rashes.  No swelling.  No track marks.  Neurological: He is alert and oriented to person, place, and time. No cranial nerve deficit. He exhibits normal muscle tone. Coordination normal.  Patient seems mildly agitated but he is not tremulous.  Movements are coordinated and purposeful.  He has been standing the entire time I speak with him.  No ataxia or appearance of instability.  Skin: Skin is warm and dry.  Patient exhibits an area on his left upper volar arm and lower upper arm where he reports he has a rash.  This is very subtle.  There is some mild appearance of blotchy erythema.  No papules or vesicles.  No track marks or wounds in the [facility] fossa.  General appearance of this is fairly innocuous could be just minor cannot irritation or mild contact dermatitis.  At this time, does not have appearance of cellulitis or other vesiculopapular rash.     ED Treatments / Results  Labs (all labs ordered are listed, but only abnormal results are displayed) Labs Reviewed - No data to display  EKG None  Radiology No results found.  Procedures Procedures (including critical care  time)  Medications Ordered in ED Medications - No data to display   Initial Impression / Assessment and Plan / ED Course  I have reviewed the triage vital signs and the nursing notes.  Pertinent labs & imaging results that were available during my care of the patient were reviewed by me and considered in my medical decision making (see chart for details).    Patient is very resistant to engaging in a full evaluation.  He is very specific that he agreed to only come here for a minor rash and some ointment.  He is upset that his mother and sister got him here for other evaluation which he refuses.  They  understandably have concerns for the patient's safety with history of mental illness and probable drug abuse.  However, at this time patient does not meet IVC criteria.  He is alert and oriented x3.  He does not have any obvious sign of actively hallucinating.  He does not have any homicidal or suicidal thoughts or plans.  I have reviewed with the patient and the family members that they should return at any time that they wish further treatment or have concerns, but currently, the patient has capacity for decision-making and does not wish any further interventions or treatments.  Final Clinical Impressions(s) / ED Diagnoses   Final diagnoses:  Encounter for counseling about behavior disorder  Rash  Family distress    ED Discharge Orders         Ordered    hydrocortisone cream 0.5 %  2 times daily     10/13/18 1653           Arby Barrette, MD 10/13/18 1707

## 2018-10-13 NOTE — ED Notes (Signed)
ED Provider at bedside. 

## 2018-10-13 NOTE — ED Notes (Signed)
PT AGITATED. PT REFUSING TO CHANGE INTO SCRUBS AND CONTINUE WITH PROCESS. PT DENIES SI/HI. FAMILY STATES PT DRUG USE. PSYCHOTIC BEHAVIOR. OFF MEDICATIONS. SECURITY PRESENT FOR SUPPORT. PT REQUESTING TO LEAVE. EDP PFEIFFER CALLED TO SEE PT. FAMILY PRESENT

## 2019-03-18 ENCOUNTER — Encounter (HOSPITAL_COMMUNITY): Payer: Self-pay

## 2019-03-18 ENCOUNTER — Emergency Department (HOSPITAL_COMMUNITY)
Admission: EM | Admit: 2019-03-18 | Discharge: 2019-03-19 | Disposition: A | Discharge status: Other Acute Inpatient Hospital | Payer: Self-pay | Attending: Emergency Medicine | Admitting: Emergency Medicine

## 2019-03-18 DIAGNOSIS — R443 Hallucinations, unspecified: Secondary | ICD-10-CM | POA: Insufficient documentation

## 2019-03-18 DIAGNOSIS — F29 Unspecified psychosis not due to a substance or known physiological condition: Secondary | ICD-10-CM

## 2019-03-18 DIAGNOSIS — F203 Undifferentiated schizophrenia: Secondary | ICD-10-CM | POA: Insufficient documentation

## 2019-03-18 DIAGNOSIS — F312 Bipolar disorder, current episode manic severe with psychotic features: Secondary | ICD-10-CM | POA: Insufficient documentation

## 2019-03-18 DIAGNOSIS — Z87891 Personal history of nicotine dependence: Secondary | ICD-10-CM | POA: Insufficient documentation

## 2019-03-18 DIAGNOSIS — Z79899 Other long term (current) drug therapy: Secondary | ICD-10-CM | POA: Insufficient documentation

## 2019-03-18 LAB — CBC WITH DIFFERENTIAL/PLATELET
Abs Immature Granulocytes: 0.01 10*3/uL (ref 0.00–0.07)
Basophils Absolute: 0 10*3/uL (ref 0.0–0.1)
Basophils Relative: 0 %
Eosinophils Absolute: 0.1 10*3/uL (ref 0.0–0.5)
Eosinophils Relative: 1 %
HCT: 47.2 % (ref 39.0–52.0)
Hemoglobin: 15.2 g/dL (ref 13.0–17.0)
Immature Granulocytes: 0 %
Lymphocytes Relative: 58 %
Lymphs Abs: 5 10*3/uL — ABNORMAL HIGH (ref 0.7–4.0)
MCH: 29.7 pg (ref 26.0–34.0)
MCHC: 32.2 g/dL (ref 30.0–36.0)
MCV: 92.4 fL (ref 80.0–100.0)
Monocytes Absolute: 0.6 10*3/uL (ref 0.1–1.0)
Monocytes Relative: 7 %
Neutro Abs: 3 10*3/uL (ref 1.7–7.7)
Neutrophils Relative %: 34 %
Platelets: 300 10*3/uL (ref 150–400)
RBC: 5.11 MIL/uL (ref 4.22–5.81)
RDW: 12.7 % (ref 11.5–15.5)
WBC: 8.7 10*3/uL (ref 4.0–10.5)
nRBC: 0 % (ref 0.0–0.2)

## 2019-03-18 LAB — COMPREHENSIVE METABOLIC PANEL
ALT: 22 U/L (ref 0–44)
AST: 21 U/L (ref 15–41)
Albumin: 4.8 g/dL (ref 3.5–5.0)
Alkaline Phosphatase: 63 U/L (ref 38–126)
Anion gap: 12 (ref 5–15)
BUN: 14 mg/dL (ref 6–20)
CO2: 22 mmol/L (ref 22–32)
Calcium: 9 mg/dL (ref 8.9–10.3)
Chloride: 105 mmol/L (ref 98–111)
Creatinine, Ser: 0.9 mg/dL (ref 0.61–1.24)
GFR calc Af Amer: >60 mL/min (ref >60)
GFR calc non Af Amer: >60 mL/min (ref >60)
Glucose, Bld: 128 mg/dL — ABNORMAL HIGH (ref 70–99)
Potassium: 3.1 mmol/L — ABNORMAL LOW (ref 3.5–5.1)
Sodium: 139 mmol/L (ref 135–145)
Total Bilirubin: 0.8 mg/dL (ref 0.3–1.2)
Total Protein: 7.9 g/dL (ref 6.5–8.1)

## 2019-03-18 LAB — URINALYSIS, ROUTINE W REFLEX MICROSCOPIC
Bilirubin Urine: NEGATIVE
Glucose, UA: NEGATIVE mg/dL
Hgb urine dipstick: NEGATIVE
Ketones, ur: NEGATIVE mg/dL
Leukocytes,Ua: NEGATIVE
Nitrite: NEGATIVE
Protein, ur: NEGATIVE mg/dL
Specific Gravity, Urine: 1.021 (ref 1.005–1.030)
pH: 5 (ref 5.0–8.0)

## 2019-03-18 LAB — RAPID URINE DRUG SCREEN, HOSP PERFORMED
Amphetamines: NOT DETECTED
Barbiturates: NOT DETECTED
Benzodiazepines: NOT DETECTED
Cocaine: NOT DETECTED
Opiates: NOT DETECTED
Tetrahydrocannabinol: NOT DETECTED

## 2019-03-18 LAB — ETHANOL: Alcohol, Ethyl (B): <10 mg/dL (ref <10)

## 2019-03-18 LAB — ACETAMINOPHEN LEVEL: Acetaminophen (Tylenol), Serum: <10 ug/mL — ABNORMAL LOW (ref 10–30)

## 2019-03-18 LAB — SALICYLATE LEVEL: Salicylate Lvl: <7 mg/dL (ref 2.8–30.0)

## 2019-03-18 MED ORDER — LORAZEPAM 2 MG/ML IJ SOLN
2.0000 mg | Freq: Once | INTRAMUSCULAR | Status: AC
  Administered 2019-03-18: 2 mg via INTRAMUSCULAR
  Filled 2019-03-18: qty 1

## 2019-03-18 MED ORDER — ZIPRASIDONE MESYLATE 20 MG IM SOLR
20.0000 mg | Freq: Once | INTRAMUSCULAR | Status: AC
  Administered 2019-03-18: 20 mg via INTRAMUSCULAR

## 2019-03-18 MED ORDER — STERILE WATER FOR INJECTION IJ SOLN
INTRAMUSCULAR | Status: AC
  Administered 2019-03-18: 10 mL
  Filled 2019-03-18: qty 10

## 2019-03-18 MED ORDER — LORAZEPAM 2 MG/ML IJ SOLN: 1.0000 mg | INTRAMUSCULAR | Status: DC | PRN

## 2019-03-18 MED ORDER — ZIPRASIDONE MESYLATE 20 MG IM SOLR
INTRAMUSCULAR | Status: AC
  Filled 2019-03-18: qty 20

## 2019-03-18 NOTE — ED Notes (Signed)
Police at bedside. Patient uncooperative. Patient attempting to spit on staff.

## 2019-03-18 NOTE — ED Notes (Signed)
Bed: [facility] Expected date:  Expected time:  Means of arrival:  Comments: Res A
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
## 2019-03-18 NOTE — ED Provider Notes (Addendum)
[facility]-EMERGENCY DEPT Provider Note   CSN: [id number] Arrival date & time: 03/18/19  1430    History   Chief Complaint Chief Complaint  Patient presents with  . IVC  . Hallucinations  . Delusional    HPI Todd [name] is a 23 y.o. male.    Level 5 caveat due to psychiatric disorder. HPI Patient brought in by police under IVC.  Will not speak to me this is yelling obscenities.  Reviewing IVC paperwork reportedly is swinging a knife in the air and attempt to get rid of the demons.  Reportedly has not been taking his Risperdal.  Previous history of inpatient commitment. Past Medical History:  Diagnosis Date  . Bipolar 1 disorder (HCC)   . Schizophrenia [facility])     Patient Active Problem List   Diagnosis Date Noted  . Severe manic bipolar 1 disorder with psychotic behavior (HCC) 06/03/2017  . Tobacco use disorder 03/19/2016  . Cannabis use disorder, mild, abuse 03/18/2016  . Psychosis (HCC)     History reviewed. No pertinent surgical history.      Home Medications    Prior to Admission medications   Medication Sig Start Date End Date Taking? Authorizing Provider  carbamazepine (TEGRETOL) 200 MG tablet Take 1 tablet (200 mg total) by mouth 2 (two) times daily after a meal. Patient not taking: Reported on 03/18/2019 04/07/18   Laveda Abbe, NP  FLUoxetine (PROZAC) 10 MG tablet 1 tab by mouth in the morning Patient not taking: Reported on 03/18/2019 02/28/18   Julieanne Manson, MD  hydrocortisone cream 0.5 % Apply 1 application topically 2 (two) times daily. Patient not taking: Reported on 03/18/2019 10/13/18   Arby Barrette, MD  OLANZapine (ZYPREXA) 5 MG tablet Take 1 tablet (5 mg total) by mouth at bedtime. Patient not taking: Reported on 03/18/2019 02/28/18 02/28/19  Julieanne Manson, MD  risperiDONE (RISPERDAL) 0.5 MG tablet Take 1 tablet (0.5 mg total) by mouth 2 (two) times daily. Patient not taking: Reported on  03/18/2019 04/07/18   Laveda Abbe, NP    Family History Family History  Problem Relation Age of Onset  . Hyperlipidemia Father   . Diabetes Maternal Grandmother   . Alcohol abuse Paternal Grandfather     Social History Social History   Tobacco Use  . Smoking status: Former Smoker    Packs/day: 0.00  . Smokeless tobacco: Never Used  Substance Use Topics  . Alcohol use: Yes    Alcohol/week: 0.0 standard drinks    Comment: Beer and Tequila on weekends.  3 beers and 4 shots of Tequila over 2 weekends per month  . Drug use: Yes    Types: Marijuana    Comment: Marijuana:  starts and stops.  When smoking, daily.  Tested every 2 weeks due to taking prescription drug abuse--stopped in [place].  Goes throught 05/2016. Has used MJ since 6th grade.  Went to [employer] a lot daily.     Allergies   Patient has no known allergies.   Review of Systems Review of Systems  Unable to perform ROS: Psychiatric disorder     Physical Exam Updated Vital Signs BP 103/64 (BP Location: Left Arm)   Pulse 98   Temp 97.7 F (36.5 C) (Oral)   Resp 16   SpO2 100%   Physical Exam Nursing note reviewed.  HENT:     Head: Atraumatic.  Cardiovascular:     Comments: Patient yelling and being restrained by police.  Tachycardia  Pulmonary:     Effort: No respiratory distress.  Musculoskeletal:        General: No signs of injury.  Skin:    Findings: No erythema.  Neurological:     Comments: Patient yelling obscenities and being restrained by police.  Psychiatric:     Comments: Patient yelling obscenities and being restrained by police.      ED Treatments / Results  Labs (all labs ordered are listed, but only abnormal results are displayed) Labs Reviewed  COMPREHENSIVE METABOLIC PANEL - Abnormal; Notable for the following components:      Result Value   Potassium 3.1 (*)    Glucose, Bld 128 (*)    All other components within normal limits  ACETAMINOPHEN LEVEL -  Abnormal; Notable for the following components:   Acetaminophen (Tylenol), Serum <10 (*)    All other components within normal limits  CBC WITH DIFFERENTIAL/PLATELET - Abnormal; Notable for the following components:   Lymphs Abs 5.0 (*)    All other components within normal limits  URINALYSIS, ROUTINE W REFLEX MICROSCOPIC  RAPID URINE DRUG SCREEN, HOSP PERFORMED  ETHANOL  SALICYLATE LEVEL    EKG EKG Interpretation  Date/Time:  Wednesday March 18 2019 14:48:29 EDT Ventricular Rate:  129 PR Interval:    QRS Duration: 92 QT Interval:  299 QTC Calculation: 438 R Axis:   103 Text Interpretation:  Sinus tachycardia Consider right atrial enlargement Borderline right axis deviation Confirmed by Benjiman Core [phone]) on 03/18/2019 3:08:36 PM   Radiology No results found.  Procedures Procedures (including critical care time)  Medications Ordered in ED Medications  LORazepam (ATIVAN) injection 1 mg (has no administration in time range)  risperiDONE (RISPERDAL) tablet 0.5 mg (has no administration in time range)  ziprasidone (GEODON) injection 20 mg (20 mg Intramuscular Given 03/18/19 1449)  sterile water (preservative free) injection (10 mLs  Given 03/18/19 1450)  LORazepam (ATIVAN) injection 2 mg (2 mg Intramuscular Given 03/18/19 1916)     Initial Impression / Assessment and Plan / ED Course  I have reviewed the triage vital signs and the nursing notes.  Pertinent labs & imaging results that were available during my care of the patient were reviewed by me and considered in my medical decision making (see chart for details).  Clinical Course as of Mar 18 1099  Wed Mar 18, 2019  1732 Normal  Urine rapid drug screen (hosp performed) [EW]  1732 Normal  Salicylate level [EW]  1732 Normal except potassium low, glucose high  Comprehensive metabolic panel(!) [EW]  1732 Normal  Urinalysis, Routine w reflex microscopic [EW]  1732 Normal  CBC with Differential(!) [EW]    Clinical  Course User Index [EW] Mancel Bale, MD       Patient presents under IVC for psychosis.  Required IM Geodon for his combativeness.  Care will be turned over to oncoming provider.  Final Clinical Impressions(s) / ED Diagnoses   Final diagnoses:  Psychosis, unspecified psychosis type [facility])    ED Discharge Orders    None       Benjiman Core, MD 03/18/19 1512    Benjiman Core, MD 03/19/19 1100

## 2019-03-18 NOTE — ED Notes (Signed)
Patient calm but keeps stating "I need to go downtown to pay for what I did". "I need to register".

## 2019-03-18 NOTE — ED Provider Notes (Signed)
5:33 PM-checkout from Dr. Rubin Payor to evaluate labs then consult TTS.  Labs have returned and are reassuring.  Mild hypokalemia, patient not known to be losing fluids, or taking a diuretic.  Will supplement with oral nutrition as tolerated, and a dose of potassium.  Patient is now medically cleared for treatment by psychiatry.  Clinical Course as of Mar 17 1733  Wed Mar 18, 2019  1732 Normal  Urine rapid drug screen (hosp performed) [EW]  1732 Normal  Salicylate level [EW]  1732 Normal except potassium low, glucose high  Comprehensive metabolic panel(!) [EW]  1732 Normal  Urinalysis, Routine w reflex microscopic [EW]  1732 Normal  CBC with Differential(!) [EW]    Clinical Course User Index [EW] Mancel Bale, MD    Patient Vitals for the past 24 hrs:  BP Temp Temp src Pulse Resp SpO2  03/18/19 1530 118/81 - - (!) 110 (!) 24 99 %  03/18/19 1500 116/71 - - (!) 132 20 98 %  03/18/19 1448 129/71 97.8 F (36.6 C) Oral (!) 128 17 100 %    Medical Decision Making: Patient under IVC commitment, aggressive, verbalizing visual hallucinations, engaging in dangerous behavior by swinging a knife around.  He is reportedly not taking his antipsychotic medication.  He has bipolar disorder.  CRITICAL CARE-no Performed by: Mancel Bale  TTS consultation    Mancel Bale, MD 03/18/19 1736
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
## 2019-03-18 NOTE — ED Notes (Signed)
Patient was offered his meal with his left hand removed from the restraint. Patient ate one bite and stated he was not hungry. Placed patients left wrist back into restraint.

## 2019-03-18 NOTE — BH Assessment (Signed)
[facility] Assessment Progress Note    Per Elta Guadeloupe, NP, patient is recommended for inpatient treatment.

## 2019-03-19 ENCOUNTER — Other Ambulatory Visit: Payer: Self-pay

## 2019-03-19 ENCOUNTER — Encounter (HOSPITAL_COMMUNITY): Payer: Self-pay

## 2019-03-19 ENCOUNTER — Inpatient Hospital Stay (HOSPITAL_COMMUNITY)
Admission: AD | Admit: 2019-03-19 | Discharge: 2019-03-27 | DRG: 885 | Disposition: A | Discharge status: Home / Self Care | Payer: No Typology Code available for payment source | Source: Intra-hospital | Attending: Psychiatry | Admitting: Psychiatry

## 2019-03-19 DIAGNOSIS — F312 Bipolar disorder, current episode manic severe with psychotic features: Secondary | ICD-10-CM | POA: Diagnosis present

## 2019-03-19 DIAGNOSIS — R45851 Suicidal ideations: Secondary | ICD-10-CM | POA: Diagnosis present

## 2019-03-19 DIAGNOSIS — Z8349 Family history of other endocrine, nutritional and metabolic diseases: Secondary | ICD-10-CM | POA: Diagnosis not present

## 2019-03-19 DIAGNOSIS — R443 Hallucinations, unspecified: Secondary | ICD-10-CM

## 2019-03-19 DIAGNOSIS — G4700 Insomnia, unspecified: Secondary | ICD-10-CM | POA: Diagnosis present

## 2019-03-19 DIAGNOSIS — Z9114 Patient's other noncompliance with medication regimen: Secondary | ICD-10-CM

## 2019-03-19 DIAGNOSIS — Z833 Family history of diabetes mellitus: Secondary | ICD-10-CM | POA: Diagnosis not present

## 2019-03-19 DIAGNOSIS — Z87891 Personal history of nicotine dependence: Secondary | ICD-10-CM

## 2019-03-19 DIAGNOSIS — Z811 Family history of alcohol abuse and dependence: Secondary | ICD-10-CM | POA: Diagnosis not present

## 2019-03-19 DIAGNOSIS — F259 Schizoaffective disorder, unspecified: Secondary | ICD-10-CM | POA: Insufficient documentation

## 2019-03-19 MED ORDER — HYDROXYZINE HCL 50 MG PO TABS
50.0000 mg | ORAL_TABLET | Freq: Three times a day (TID) | ORAL | Status: DC | PRN
  Filled 2019-03-19: qty 10

## 2019-03-19 MED ORDER — ACETAMINOPHEN 325 MG PO TABS: 650.0000 mg | ORAL_TABLET | Freq: Four times a day (QID) | ORAL | Status: DC | PRN

## 2019-03-19 MED ORDER — CLONAZEPAM 1 MG PO TABS
1.0000 mg | ORAL_TABLET | Freq: Three times a day (TID) | ORAL | Status: AC
  Administered 2019-03-19 – 2019-03-21 (×6): 1 mg via ORAL
  Filled 2019-03-19 (×6): qty 1

## 2019-03-19 MED ORDER — TRAZODONE HCL 100 MG PO TABS
200.0000 mg | ORAL_TABLET | Freq: Every evening | ORAL | Status: DC | PRN
  Filled 2019-03-19: qty 2

## 2019-03-19 MED ORDER — BENZTROPINE MESYLATE 0.5 MG PO TABS
0.5000 mg | ORAL_TABLET | Freq: Two times a day (BID) | ORAL | Status: DC
  Administered 2019-03-19 – 2019-03-27 (×12): 0.5 mg via ORAL
  Filled 2019-03-19 (×2): qty 1
  Filled 2019-03-19: qty 20
  Filled 2019-03-19: qty 1
  Filled 2019-03-19: qty 20
  Filled 2019-03-19 (×15): qty 1

## 2019-03-19 MED ORDER — RISPERIDONE 3 MG PO TABS
3.0000 mg | ORAL_TABLET | Freq: Two times a day (BID) | ORAL | Status: DC
  Administered 2019-03-19 – 2019-03-20 (×2): 3 mg via ORAL
  Filled 2019-03-19 (×4): qty 1

## 2019-03-19 MED ORDER — RISPERIDONE 0.5 MG PO TABS
0.5000 mg | ORAL_TABLET | Freq: Two times a day (BID) | ORAL | Status: DC
  Administered 2019-03-19: 0.5 mg via ORAL
  Filled 2019-03-19: qty 1

## 2019-03-19 MED ORDER — MAGNESIUM HYDROXIDE 400 MG/5ML PO SUSP: 30.0000 mL | Freq: Every day | ORAL | Status: DC | PRN

## 2019-03-19 MED ORDER — ALUM & MAG HYDROXIDE-SIMETH 200-200-20 MG/5ML PO SUSP: 30.0000 mL | ORAL | Status: DC | PRN

## 2019-03-19 MED ORDER — TEMAZEPAM 15 MG PO CAPS
30.0000 mg | ORAL_CAPSULE | Freq: Every day | ORAL | Status: DC
  Administered 2019-03-20 – 2019-03-24 (×3): 30 mg via ORAL
  Filled 2019-03-19: qty 2
  Filled 2019-03-19: qty 6
  Filled 2019-03-19: qty 4
  Filled 2019-03-19: qty 2

## 2019-03-19 NOTE — Progress Notes (Signed)
Received Todd [name] from the main ED earlier in the night. He was disorganized, but directable. He made several brief phone calls, received a snack and interacted briefly with a patient. He eventually went to sleep and slept throughout the night without incident.

## 2019-03-19 NOTE — BH Assessment (Addendum)
[facility] Assessment Progress Note  Per Juanetta Beets, DO, this pt requires psychiatric hospitalization at this time.  Malva Limes, RN, [facility] has assigned pt to [facility] Rm 502-2; she will call when [facility] is ready to receive pt.  Pt presents under IVC initiated by pt's mother, and upheld by Dr Sharma Covert, and IVC documents have been faxed to [facility].  [name] nurse, Kendal Hymen, has been notified, and agrees to call report to [phone].  Pt is to be transported via [employer].   Doylene Canning, [place] [facility]al Health Coordinator [phone]   Addendum:  At 12:14 Linsey calls from [facility].  They will be ready to receive pt at 13:30.  Kendal Hymen has been notified.  Doylene Canning, [place] [facility] Coordinator [phone]
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
## 2019-03-19 NOTE — [facility] Group Notes (Signed)
[facility] Group Notes:  (Nursing/MHT/Case Management/Adjunct)  Date:  03/19/2019  Time:  4:00 PM  Type of Therapy:  Nurse Education  Participation Level:  Active  Participation Quality:  Appropriate and Attentive  Affect:  Appropriate  Cognitive:  Alert and Appropriate  Insight:  Appropriate  Engagement in Group:  Engaged and Improving  Modes of Intervention:  Discussion, Education and Exploration  Summary of Progress/Problems:  pt's discussed coping mechanisms and ways to utilize these skills when they leave.  Todd [name] [name] 03/19/2019, 5:21 PM

## 2019-03-19 NOTE — Progress Notes (Signed)
Psychoeducational Group Note  Date:  03/19/2019 Time:  2045  Group Topic/Focus:  Wrap-Up Group:   The focus of this group is to help patients review their daily goal of treatment and discuss progress on daily workbooks.  Participation Level: Did Not Attend  Participation Quality:  Not Applicable  Affect:  Not Applicable  Cognitive:  Not Applicable  Insight:  Not Applicable  Engagement in Group: Not Applicable  Additional Comments:  The patient did not attend group since he was asleep in his bedroom.   Hazle Coca S 03/19/2019, 8:45 PM

## 2019-03-19 NOTE — ED Notes (Signed)
Pt seems to having visual hallucinations.  Pt standing in bathroom, looking at light, looking at light.

## 2019-03-19 NOTE — Tx Team (Signed)
Initial Treatment Plan 03/19/2019 3:22 PM [address] Jadel Wysong JOI:[id number]    PATIENT STRESSORS: Health problems Medication change or noncompliance   PATIENT STRENGTHS: Physical Health Supportive family/friends Work skills   PATIENT IDENTIFIED PROBLEMS: "getting out of here"                     DISCHARGE CRITERIA:  Ability to meet basic life and health needs Improved stabilization in mood, thinking, and/or behavior Medical problems require only outpatient monitoring Motivation to continue treatment in a less acute level of care  PRELIMINARY DISCHARGE PLAN: Outpatient therapy Participate in family therapy Return to previous living arrangement  PATIENT/FAMILY INVOLVEMENT: This treatment plan has been presented to and reviewed with the patient, Todd [name].  The patient and family have been given the opportunity to ask questions and make suggestions.  Raylene Miyamoto, RN 03/19/2019, 3:22 PM

## 2019-03-19 NOTE — Consult Note (Addendum)
[facility] Face-to-Face Psychiatry Consult   Reason for Consult:  Psychotic/Manic behavior Referring Physician:  EDP Patient Identification: Todd [name] MRN:  [id number] Principal Diagnosis: Severe manic bipolar 1 disorder with psychotic behavior (HCC) Diagnosis:  Principal Problem:   Severe manic bipolar 1 disorder with psychotic behavior (HCC)   Total Time spent with patient: 30 minutes  Subjective:   Todd [name] is a 23 y.o. male patient admitted with agitated and manic behavior.  HPI:  Pt was seen and chart reviewed with treatment team and Dr Sharma Covert. Pt denies suicidal/homicidal ideation. Pt stated he hears voices but could not elaborate on what, he also appears to be responding to internal stimuli. Pt lives with his mother and sister. Pt stated he has not been taking his medications for "a while." Pt stated he has been inpatient before but it was a while ago. Pt is calm and cooperative and is redirectable. Pt is seen standing in his room with his back to the door. He was able to answer questions appropriately and stated he came in for chest pain. [name] mother had him placed under IVC due to agitated behavior at home and isolating in his room. He would benefit from an inpatient psychiatric admission for stabilization and medication management. According to chart review it appears that Pt was on Invega injectable at one time but this was changed to Zyprexa 5 mg at bedtime when he was discharged from [facility] in 11-2017.   Past Psychiatric History: As above  Risk to Self: Suicidal Ideation: No Suicidal Intent: No Is patient at risk for suicide?: Yes Suicidal Plan?: No Access to Means: Yes Specify Access to Suicidal Means: has access to knives What has been your use of drugs/alcohol within the last 12 months?: denies How many times?: 1 Other Self Harm Risks: unemployed and offf medications Triggers for Past Attempts: None known Intentional Self Injurious  Behavior: (family states that he has possibly cut himself in the past) Risk to Others: Homicidal Ideation: Yes-Currently Present Thoughts of Harm to Others: No-Not Currently Present/Within Last 6 Months Current Homicidal Intent: Yes-Currently Present Current Homicidal Plan: Yes-Currently Present Describe Current Homicidal Plan: threats towards mother with knives Access to Homicidal Means: Yes Describe Access to Homicidal Means: knives Identified Victim: pt's mother Imelda History of harm to others?: No Assessment of Violence: On admission Violent Behavior Description: aggressive towards family Does patient have access to weapons?: Yes (Comment)(kitchen knives) Criminal Charges Pending?: No Does patient have a court date: No Prior Inpatient Therapy: Prior Inpatient Therapy: Yes Prior Therapy Dates: 2017/2018 Prior Therapy Facilty/Provider(s): [facility] Reason for Treatment: psychosis Prior Outpatient Therapy: Prior Outpatient Therapy: No Does patient have an ACCT team?: No Does patient have Intensive In-House Services?  : No Does patient have [facility] services? : No Does patient have P4CC services?: No  Past Medical History:  Past Medical History:  Diagnosis Date  . Bipolar 1 disorder (HCC)   . Schizophrenia (HCC)    History reviewed. No pertinent surgical history. Family History:  Family History  Problem Relation Age of Onset  . Hyperlipidemia Father   . Diabetes Maternal Grandmother   . Alcohol abuse Paternal Grandfather    Family Psychiatric  History: Pt denies any family history. Social History:  Social History   Substance and Sexual Activity  Alcohol Use Yes  . Alcohol/week: 0.0 standard drinks   Comment: Beer and Tequila on weekends.  3 beers and 4 shots of Tequila over 2 weekends per month  Social History   Substance and Sexual Activity  Drug Use Yes  . Types: Marijuana   Comment: Marijuana:  starts and stops.  When smoking, daily.  Tested every 2 weeks  due to taking prescription drug abuse--stopped in [place].  Goes throught 05/2016. Has used MJ since 6th grade.  Went to [employer] a lot daily.    Social History   Socioeconomic History  . Marital status: Single    Spouse name: Not on file  . Number of children: 0  . Years of education: Not on file  . Highest education level: Not on file  Occupational History  . 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 Financial resource strain: Not on file  . Food insecurity:    Worry: Not on file    Inability: Not on file  . Transportation needs:    Medical: Not on file    Non-medical: Not on file  Tobacco Use  . Smoking status: Former Smoker    Packs/day: 0.00  . Smokeless tobacco: Never Used  Substance and Sexual Activity  . Alcohol use: Yes    Alcohol/week: 0.0 standard drinks    Comment: Beer and Tequila on weekends.  3 beers and 4 shots of Tequila over 2 weekends per month  . Drug use: Yes    Types: Marijuana    Comment: Marijuana:  starts and stops.  When smoking, daily.  Tested every 2 weeks due to taking prescription drug abuse--stopped in [place].  Goes throught 05/2016. Has used MJ since 6th grade.  Went to [employer] a lot daily.  Marland Kitchen Sexual activity: Yes    Partners: Female    Birth control/protection: Condom  Lifestyle  . Physical activity:    Days per week: Not on file    Minutes per session: Not on file  . Stress: Not on file  Relationships  . Social connections:    Talks on phone: Not on file    Gets together: Not on file    Attends religious service: Not on file    Active member of club or organization: Not on file    Attends meetings of clubs or organizations: Not on file    Relationship status: Not on file  Other Topics Concern  . Not on file  Social History Narrative   Originally from [place]   Came to [employer]. When 23 years old.     Lives with parents and 44 yo sister.    Additional Social History: N/A    Allergies:   No Known Allergies  Labs:  Results for orders placed or performed during the hospital encounter of 03/18/19 (from the past 48 hour(s))  Comprehensive metabolic panel     Status: Abnormal   Collection Time: 03/18/19  2:56 PM  Result Value Ref Range   Sodium 139 135 - 145 mmol/L   Potassium 3.1 (L) 3.5 - 5.1 mmol/L   Chloride 105 98 - 111 mmol/L   CO2 22 22 - 32 mmol/L   Glucose, Bld 128 (H) 70 - 99 mg/dL   BUN 14 6 - 20 mg/dL   Creatinine, Ser 1.61 0.61 - 1.24 mg/dL   Calcium 9.0 8.9 - 09.6 mg/dL   Total Protein 7.9 6.5 - 8.1 g/dL   Albumin 4.8 3.5 - 5.0 g/dL   AST 21 15 - 41 U/L   ALT 22 0 - 44 U/L   Alkaline Phosphatase 63 38 - 126 U/L   Total Bilirubin 0.8 0.3 -  1.2 mg/dL   GFR calc non Af Amer >60 >60 mL/min   GFR calc Af Amer >60 >60 mL/min   Anion gap 12 5 - 15    Comment: Performed at [facility], [address] [place], [place] [address]  Ethanol     Status: None   Collection Time: 03/18/19  2:56 PM  Result Value Ref Range   Alcohol, Ethyl (B) <10 <10 mg/dL    Comment: (NOTE) Lowest detectable limit for serum alcohol is 10 mg/dL. For medical purposes only. Performed at [facility], [address] [place], [place] [address]   Acetaminophen level     Status: Abnormal   Collection Time: 03/18/19  2:56 PM  Result Value Ref Range   Acetaminophen (Tylenol), Serum <10 (L) 10 - 30 ug/mL    Comment: (NOTE) Therapeutic concentrations vary significantly. A range of 10-30 ug/mL  may be an effective concentration for many patients. However, some  are best treated at concentrations outside of this range. Acetaminophen concentrations >150 ug/mL at 4 hours after ingestion  and >50 ug/mL at 12 hours after ingestion are often associated with  toxic reactions. Performed at [facility], [address] [place], [place] [address]   Salicylate level     Status: None   Collection Time: 03/18/19  2:56 PM  Result Value Ref  Range   Salicylate Lvl <7.0 2.8 - 30.0 mg/dL    Comment: Performed at [facility], [address] [place], [place] [address]  CBC with Differential     Status: Abnormal   Collection Time: 03/18/19  2:56 PM  Result Value Ref Range   WBC 8.7 4.0 - 10.5 K/uL   RBC 5.11 4.22 - 5.81 MIL/uL   Hemoglobin 15.2 13.0 - 17.0 g/dL   HCT 84.147.2 32.439.0 - 40.152.0 %   MCV 92.4 80.0 - 100.0 fL   MCH 29.7 26.0 - 34.0 pg   MCHC 32.2 30.0 - 36.0 g/dL   RDW 02.712.7 25.311.5 - 66.415.5 %   Platelets 300 150 - 400 K/uL   nRBC 0.0 0.0 - 0.2 %   Neutrophils Relative % 34 %   Neutro Abs 3.0 1.7 - 7.7 K/uL   Lymphocytes Relative 58 %   Lymphs Abs 5.0 (H) 0.7 - 4.0 K/uL   Monocytes Relative 7 %   Monocytes Absolute 0.6 0.1 - 1.0 K/uL   Eosinophils Relative 1 %   Eosinophils Absolute 0.1 0.0 - 0.5 K/uL   Basophils Relative 0 %   Basophils Absolute 0.0 0.0 - 0.1 K/uL   Immature Granulocytes 0 %   Abs Immature Granulocytes 0.01 0.00 - 0.07 K/uL    Comment: Performed at [facility], [address] [place], [place] [address]  Urinalysis, Routine w reflex microscopic     Status: None   Collection Time: 03/18/19  4:08 PM  Result Value Ref Range   Color, Urine YELLOW YELLOW   APPearance CLEAR CLEAR   Specific Gravity, Urine 1.021 1.005 - 1.030   pH 5.0 5.0 - 8.0   Glucose, UA NEGATIVE NEGATIVE mg/dL   Hgb urine dipstick NEGATIVE NEGATIVE   Bilirubin Urine NEGATIVE NEGATIVE   Ketones, ur NEGATIVE NEGATIVE mg/dL   Protein, ur NEGATIVE NEGATIVE mg/dL   Nitrite NEGATIVE NEGATIVE   Leukocytes,Ua NEGATIVE NEGATIVE    Comment: Performed at [facility], [address] [place], [place] [address]  Urine rapid drug screen (hosp performed)  Status: None   Collection Time: 03/18/19  4:08 PM  Result Value Ref Range   Opiates NONE DETECTED NONE DETECTED   Cocaine NONE DETECTED NONE DETECTED   Benzodiazepines NONE DETECTED NONE DETECTED   Amphetamines NONE DETECTED NONE  DETECTED   Tetrahydrocannabinol NONE DETECTED NONE DETECTED   Barbiturates NONE DETECTED NONE DETECTED    Comment: (NOTE) DRUG SCREEN FOR MEDICAL PURPOSES ONLY.  IF CONFIRMATION IS NEEDED FOR ANY PURPOSE, NOTIFY LAB WITHIN 5 DAYS. LOWEST DETECTABLE LIMITS FOR URINE DRUG SCREEN Drug Class                     Cutoff (ng/mL) Amphetamine and metabolites    1000 Barbiturate and metabolites    200 Benzodiazepine                 200 Tricyclics and metabolites     300 Opiates and metabolites        300 Cocaine and metabolites        300 THC                            50 Performed at [facility], [address], [place], [place] [address]     Current Facility-Administered Medications  Medication Dose Route Frequency Provider Last Rate Last Dose  . LORazepam (ATIVAN) injection 1 mg  1 mg Intramuscular Q2H PRN Mancel Bale, MD       Current Outpatient Medications  Medication Sig Dispense Refill  . carbamazepine (TEGRETOL) 200 MG tablet Take 1 tablet (200 mg total) by mouth 2 (two) times daily after a meal. (Patient not taking: Reported on 03/18/2019) 14 tablet 0  . FLUoxetine (PROZAC) 10 MG tablet 1 tab by mouth in the morning (Patient not taking: Reported on 03/18/2019) 30 tablet 3  . hydrocortisone cream 0.5 % Apply 1 application topically 2 (two) times daily. (Patient not taking: Reported on 03/18/2019) 30 g 0  . OLANZapine (ZYPREXA) 5 MG tablet Take 1 tablet (5 mg total) by mouth at bedtime. (Patient not taking: Reported on 03/18/2019) 30 tablet 3  . risperiDONE (RISPERDAL) 0.5 MG tablet Take 1 tablet (0.5 mg total) by mouth 2 (two) times daily. (Patient not taking: Reported on 03/18/2019) 14 tablet 0    Musculoskeletal: Strength & Muscle Tone: within normal limits Gait & Station: normal Patient leans: N/A  Psychiatric Specialty Exam: Physical Exam  Nursing note and vitals reviewed. Constitutional: He is oriented to person, place, and time. He appears well-developed  and well-nourished.  HENT:  Head: Normocephalic and atraumatic.  Neck: Normal range of motion.  Respiratory: Effort normal.  Musculoskeletal: Normal range of motion.  Neurological: He is alert and oriented to person, place, and time.  Psychiatric: His speech is normal. Judgment normal. His mood appears anxious. He is withdrawn and actively hallucinating. Thought content is paranoid. Cognition and memory are normal.    Review of Systems  Psychiatric/Behavioral: The patient is nervous/anxious.   All other systems reviewed and are negative.   Blood pressure 103/64, pulse 98, temperature 97.7 F (36.5 C), temperature source Oral, resp. rate 16, SpO2 100 %.There is no height or weight on file to calculate BMI.  General Appearance: Disheveled  Eye Contact:  Good  Speech:  Clear and Coherent and Slow  Volume:  Decreased  Mood:  Anxious  Affect:  Congruent  Thought Process:  Disorganized and Descriptions of Associations: Tangential  Orientation:  Full (Time, Place, and  Person)  Thought Content:  Paranoid Ideation  Suicidal Thoughts:  No  Homicidal Thoughts:  No  Memory:  Immediate;   Fair Recent;   Fair Remote;   Fair  Judgement:  Poor  Insight:  Shallow  Psychomotor Activity:  Normal  Concentration:  Concentration: Good and Attention Span: Good  Recall:  Good  Fund of Knowledge:  Good  Language:  Good  Akathisia:  No  Handed:  Right  AIMS (if indicated):   N/A  Assets:  Architect Housing Social Support  ADL's:  Intact  Cognition:  WNL  Sleep:   N/A     Treatment Plan Summary: Daily contact with patient to assess and evaluate symptoms and progress in treatment and Medication management  Disposition: Recommend psychiatric Inpatient admission when medically cleared.  Laveda Abbe, NP 03/19/2019 10:44 AM   Patient seen face-to-face for psychiatric evaluation, chart reviewed and case discussed with the physician extender and  developed treatment plan. Reviewed the information documented and agree with the treatment plan.  Juanetta Beets, DO 03/19/19 11:08 AM

## 2019-03-19 NOTE — [facility] Suicide Risk Assessment (Signed)
[facility] Admission Suicide Risk Assessment   Nursing information obtained from:  Patient, Review of record Demographic factors:  Male, Adolescent or young adult, Low socioeconomic status Current Mental Status:  Thoughts of violence towards others, Intention to act on plan to harm others Loss Factors:  Decline in physical health Historical Factors:  Impulsivity Risk Reduction Factors:  Employed, Living with another person, especially a relative, Positive social support  Total Time spent with patient: 45 minutes Principal Problem: Exacerbation and underlying psychotic disorder Diagnosis:  Active Problems:   Psychosis (HCC)  Subjective Data: Addition valid patient dangerous due to noncompliance and psychosis  Continued Clinical Symptoms:    The "Alcohol Use Disorders Identification Test", Guidelines for Use in Primary Care, Second Edition.  World Science writer [facility]). Score between 0-7:  no or low risk or alcohol related problems. Score between 8-15:  moderate risk of alcohol related problems. Score between 16-19:  high risk of alcohol related problems. Score 20 or above:  warrants further diagnostic evaluation for alcohol dependence and treatment.   CLINICAL FACTORS: Acute psychosis in the context of schizoaffective disorder that is untreated at present   COGNITIVE FEATURES THAT CONTRIBUTE TO RISK:  Loss of executive function    SUICIDE RISK:   Minimal: No identifiable suicidal ideation.  Patients presenting with no risk factors but with morbid ruminations; may be classified as minimal risk based on the severity of the depressive symptoms  PLAN OF CARE: Denies suicidal thoughts but has been behaving in a dangerous fashion as discussed in the HPI waving knives around to kill demons  I certify that inpatient services furnished can reasonably be expected to improve the patient's condition.   Malvin Johns, MD 03/19/2019, 3:06 PM

## 2019-03-19 NOTE — [facility] Group Notes (Signed)
[facility] LCSW Group Therapy Note  Date/Time: 03/19/19, 1315  Type of Therapy/Topic:  Group Therapy:  Balance in Life  Participation Level:  Did not attend  Description of Group:    This group will address the concept of balance and how it feels and looks when one is unbalanced. Patients will be encouraged to process areas in their lives that are out of balance, and identify reasons for remaining unbalanced. Facilitators will guide patients utilizing problem- solving interventions to address and correct the stressor making their life unbalanced. Understanding and applying boundaries will be explored and addressed for obtaining  and maintaining a balanced life. Patients will be encouraged to explore ways to assertively make their unbalanced needs known to significant others in their lives, using other group members and facilitator for support and feedback.  Therapeutic Goals: 1. Patient will identify two or more emotions or situations they have that consume much of in their lives. 2. Patient will identify signs/triggers that life has become out of balance:  3. Patient will identify two ways to set boundaries in order to achieve balance in their lives:  4. Patient will demonstrate ability to communicate their needs through discussion and/or role plays  Summary of Patient Progress:          Therapeutic Modalities:   Cognitive Behavioral Therapy Solution-Focused Therapy Assertiveness Training  Greg Ozro Russett, LCSW 
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
## 2019-03-19 NOTE — ED Notes (Signed)
Pt discharged safely with [employer].  All belongings were sent with patient.  Pt was in no distress at discharge. 

## 2019-03-20 DIAGNOSIS — F312 Bipolar disorder, current episode manic severe with psychotic features: Principal | ICD-10-CM

## 2019-03-20 MED ORDER — RISPERIDONE 3 MG PO TABS
3.0000 mg | ORAL_TABLET | Freq: Three times a day (TID) | ORAL | Status: DC
  Administered 2019-03-20 – 2019-03-23 (×9): 3 mg via ORAL
  Filled 2019-03-20 (×15): qty 1

## 2019-03-20 NOTE — [facility] Group Notes (Signed)
Date: 03/20/19, 1330  Type of Therapy and Topic: Chaplain group, "Hope is.." Chaplain engaged group in discussion about hope and what it looks like in each patients life and current situation.  Participation level:Did not attend  Modes of Intervention: Discussion, Education and Socialization  Summary of Progress/Problems:   Ilai Hiller Jon, LCSW   [facility] LCSW Group Therapy Note       

## 2019-03-20 NOTE — Progress Notes (Signed)
Psychoeducational Group Note  Date:  03/20/2019 Time: 1643  Group Topic/Focus:  Recovery Goals:   The focus of this group is to identify appropriate goals for recovery and establish a plan to achieve them.  Participation Level: Did Not Attend  Participation Quality:  Not Applicable  Affect:  Not Applicable  Cognitive:  Not Applicable  Insight:  Not Applicable  Engagement in Group: Not Applicable  Additional Comments:  Pt refused to attend group this afternoon.  Vieno Tarrant E 03/20/2019, 4:42 PM

## 2019-03-20 NOTE — Progress Notes (Signed)
Pt currently asleep in bed. Respiration are even and unlabored. Pt in no sign of distress. Will continue to monitor.   

## 2019-03-20 NOTE — [facility] Group Notes (Signed)
The focus of this group is to help patients establish daily goals to achieve during treatment and discuss how the patient can incorporate goal setting into their daily lives to aide in recovery.  Pt did not attend group 

## 2019-03-20 NOTE — [facility] Counselor (Signed)
CSW attempted to meet with pt for PSA but pt sleepy and could not wake up.  Will try later. Garner Nash, MSW, LCSW Clinical Social Worker 03/20/2019 2:48 PM

## 2019-03-20 NOTE — Progress Notes (Signed)
Writer spoke with patient 1:1 at nursing station. Patient reported that he was ready to leave. Writer explained to him the process. He asked Clinical research associate how was my Christmas? Writer answered his question and informed him what month it was. He reported that his mother over exaggerated this time. He reported that he took all of his medications and he was done prior to being admitted. Writer explained to him that he needs to get his medicines refilled when he is done. I am not sure patient understands this. He requested his medication early and wanted to rest. Safety maintained on unit with 15 min checks.

## 2019-03-20 NOTE — Tx Team (Signed)
Interdisciplinary Treatment and Diagnostic Plan Update  03/20/2019 Time of Session: [place] MRN: [id number]  Principal Diagnosis: Severe manic bipolar 1 disorder with psychotic behavior [facility])  Secondary Diagnoses: Principal Problem:   Severe manic bipolar 1 disorder with psychotic behavior ([place])   Current Medications:  Current Facility-Administered Medications  Medication Dose Route Frequency Provider Last Rate Last Dose  . acetaminophen (TYLENOL) tablet 650 mg  650 mg Oral Q6H PRN Johnn Hai, MD      . alum & mag hydroxide-simeth (MAALOX/MYLANTA) 200-200-20 MG/5ML suspension 30 mL  30 mL Oral Q4H PRN Johnn Hai, MD      . benztropine (COGENTIN) tablet 0.5 mg  0.5 mg Oral BID Johnn Hai, MD   0.5 mg at 03/20/19 8185  . clonazePAM (KLONOPIN) tablet 1 mg  1 mg Oral TID Johnn Hai, MD   1 mg at 03/20/19 1206  . hydrOXYzine (ATARAX/VISTARIL) tablet 50 mg  50 mg Oral TID PRN Johnn Hai, MD      . magnesium hydroxide (MILK OF MAGNESIA) suspension 30 mL  30 mL Oral Daily PRN Johnn Hai, MD      . risperiDONE (RISPERDAL) tablet 3 mg  3 mg Oral TID Johnn Hai, MD   3 mg at 03/20/19 1206  . temazepam (RESTORIL) capsule 30 mg  30 mg Oral QHS Johnn Hai, MD      . traZODone (DESYREL) tablet 200 mg  200 mg Oral QHS PRN Johnn Hai, MD       PTA Medications: Medications Prior to Admission  Medication Sig Dispense Refill Last Dose  . carbamazepine (TEGRETOL) 200 MG tablet Take 1 tablet (200 mg total) by mouth 2 (two) times daily after a meal. (Patient not taking: Reported on 03/18/2019) 14 tablet 0 Not Taking at Unknown time  . FLUoxetine (PROZAC) 10 MG tablet 1 tab by mouth in the morning (Patient not taking: Reported on 03/18/2019) 30 tablet 3 Not Taking at Unknown time  . hydrocortisone cream 0.5 % Apply 1 application topically 2 (two) times daily. (Patient not taking: Reported on 03/18/2019) 30 g 0 Not Taking at Unknown time  . OLANZapine (ZYPREXA) 5 MG  tablet Take 1 tablet (5 mg total) by mouth at bedtime. (Patient not taking: Reported on 03/18/2019) 30 tablet 3 Not Taking at Unknown time  . risperiDONE (RISPERDAL) 0.5 MG tablet Take 1 tablet (0.5 mg total) by mouth 2 (two) times daily. (Patient not taking: Reported on 03/18/2019) 14 tablet 0 Not Taking at Unknown time    Patient Stressors: Health problems Medication change or noncompliance  Patient Strengths: Physical Health Supportive family/friends Work skills  Treatment Modalities: Medication Management, Group therapy, Case management,  1 to 1 session with clinician, Psychoeducation, Recreational therapy.   Physician Treatment Plan for Primary Diagnosis: Severe manic bipolar 1 disorder with psychotic behavior ([place]) Long Term Goal(s): Improvement in symptoms so as ready for discharge Improvement in symptoms so as ready for discharge   Short Term Goals: Ability to identify and develop effective coping behaviors will improve Compliance with prescribed medications will improve  Medication Management: Evaluate patient's response, side effects, and tolerance of medication regimen.  Therapeutic Interventions: 1 to 1 sessions, Unit Group sessions and Medication administration.  Evaluation of Outcomes: Not Met  Physician Treatment Plan for Secondary Diagnosis: Principal Problem:   Severe manic bipolar 1 disorder with psychotic behavior ([place])  Long Term Goal(s): Improvement in symptoms so as ready for discharge Improvement in symptoms so as ready for discharge   Short  Term Goals: Ability to identify and develop effective coping behaviors will improve Compliance with prescribed medications will improve     Medication Management: Evaluate patient's response, side effects, and tolerance of medication regimen.  Therapeutic Interventions: 1 to 1 sessions, Unit Group sessions and Medication administration.  Evaluation of Outcomes: Not Met   RN Treatment Plan for Primary Diagnosis:  Severe manic bipolar 1 disorder with psychotic behavior ([place]) Long Term Goal(s): Knowledge of disease and therapeutic regimen to maintain health will improve  Short Term Goals: Ability to identify and develop effective coping behaviors will improve and Compliance with prescribed medications will improve  Medication Management: RN will administer medications as ordered by provider, will assess and evaluate patient's response and provide education to patient for prescribed medication. RN will report any adverse and/or side effects to prescribing provider.  Therapeutic Interventions: 1 on 1 counseling sessions, Psychoeducation, Medication administration, Evaluate responses to treatment, Monitor vital signs and CBGs as ordered, Perform/monitor CIWA, COWS, AIMS and Fall Risk screenings as ordered, Perform wound care treatments as ordered.  Evaluation of Outcomes: Not Met   LCSW Treatment Plan for Primary Diagnosis: Severe manic bipolar 1 disorder with psychotic behavior ([place]) Long Term Goal(s): Safe transition to appropriate next level of care at discharge, Engage patient in therapeutic group addressing interpersonal concerns.  Short Term Goals: Engage patient in aftercare planning with referrals and resources, Increase social support and Increase skills for wellness and recovery  Therapeutic Interventions: Assess for all discharge needs, 1 to 1 time with Social worker, Explore available resources and support systems, Assess for adequacy in community support network, Educate family and significant other(s) on suicide prevention, Complete Psychosocial Assessment, Interpersonal group therapy.  Evaluation of Outcomes: Not Met   Progress in Treatment: Attending groups: No. Participating in groups: No. Taking medication as prescribed: Yes. Toleration medication: Yes. Family/Significant other contact made: No, will contact:  when given permission Patient understands diagnosis: No. Discussing  patient identified problems/goals with staff: No. Medical problems stabilized or resolved: Yes. Denies suicidal/homicidal ideation: Yes. Issues/concerns per patient self-inventory: No. Other: none  New problem(s) identified: No, Describe:  none  New Short Term/Long Term Goal(s):  Patient Goals:  "get things done"  Discharge Plan or Barriers:   Reason for Continuation of Hospitalization: Delusions  Hallucinations Medication stabilization  Estimated Length of Stay: 3-Attendees: Patient:Todd [name] 03/20/2019   Physician: Dr. Jake Samples, MD 03/20/2019   Nursing: Boyce Medici, RN 03/20/2019   RN Care Manager: 03/20/2019   Social Worker: Lurline Idol, LCSW 03/20/2019   Recreational Therapist:  03/20/2019   Other:  03/20/2019   Other:  03/20/2019   Other: 03/20/2019   5 days       Scribe for Treatment Team: Joanne Chars, [place] 03/20/2019 1:56 PM

## 2019-03-20 NOTE — Progress Notes (Signed)
Recreation Therapy Notes  Date: 4.3.20 Time: 1000 Location: 500 Hall Dayroom  Group Topic:  Goal Setting  Goal Area(s) Addresses:  Patient will be able to identify at least 3 goals.  Patient will be able to identify benefit of investing in goals.  Patient will be able to identify benefit of setting goals.   Intervention: Worksheet, pencils, music  Activity: Goal Planning.  Patients were to identify goals they wanted to accomplish in a week, month, year and five years.  Patients were to then identify obstacles that would prevent them from reaching goals, what they need to reach their goals and what they can start doing now to work towards goals.  Education:  Discharge Planning, Pharmacologist, Leisure Education   Education Outcome: Acknowledges Education/In Group Clarification Provided/Needs Additional Education  Clinical Observations:  Pt did not attend group.     [name], LRT/CTRS         [name] A 03/20/2019 12:24 PM

## 2019-03-20 NOTE — Progress Notes (Signed)
[facility] MD Progress Note  03/20/2019 10:00 AM Todd [name]  MRN:  [id number] Subjective:    Patient continues to pace around a small area in front of and in his room he is still somewhat disorganized he keeps requesting to talk even though he is had his interview and mental status exam and keeps saying "can we talk now" and he is afraid to come into the exam room. States that one time that he was seeing and hearing demons and now states that that is not true so again he gives me contradictory answers within the same mental status exam Denies wanting to harm self or others though contracts here  Principal Problem: Severe manic bipolar 1 disorder with psychotic behavior (HCC) Diagnosis: Principal Problem:   Severe manic bipolar 1 disorder with psychotic behavior (HCC)  Total Time spent with patient: 20 minutes  Past Medical History:  Past Medical History:  Diagnosis Date  . Bipolar 1 disorder (HCC)    History reviewed. No pertinent surgical history. Family History:  Family History  Problem Relation Age of Onset  . Hyperlipidemia Father   . Diabetes Maternal Grandmother   . Alcohol abuse Paternal Grandfather    Social History:  Social History   Substance and Sexual Activity  Alcohol Use Yes  . Alcohol/week: 0.0 standard drinks   Comment: Beer and Tequila on weekends.  3 beers and 4 shots of Tequila over 2 weekends per month     Social History   Substance and Sexual Activity  Drug Use Yes  . Types: Marijuana   Comment: Marijuana:  starts and stops.  When smoking, daily.  Tested every 2 weeks due to taking prescription drug abuse--stopped in [place].  Goes throught 05/2016. Has used MJ since 6th grade.  Went to [employer] a lot daily.    Social History   Socioeconomic History  . Marital status: Single    Spouse name: Not on file  . Number of children: 0  . Years of education: Not on file  . Highest education level: Not on file  Occupational  History  . 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 Financial resource strain: Not on file  . Food insecurity:    Worry: Not on file    Inability: Not on file  . Transportation needs:    Medical: Not on file    Non-medical: Not on file  Tobacco Use  . Smoking status: Former Smoker    Packs/day: 0.00  . Smokeless tobacco: Never Used  Substance and Sexual Activity  . Alcohol use: Yes    Alcohol/week: 0.0 standard drinks    Comment: Beer and Tequila on weekends.  3 beers and 4 shots of Tequila over 2 weekends per month  . Drug use: Yes    Types: Marijuana    Comment: Marijuana:  starts and stops.  When smoking, daily.  Tested every 2 weeks due to taking prescription drug abuse--stopped in [place].  Goes throught 05/2016. Has used MJ since 6th grade.  Went to [employer] a lot daily.  Marland Kitchen Sexual activity: Yes    Partners: Female    Birth control/protection: Condom  Lifestyle  . Physical activity:    Days per week: Not on file    Minutes per session: Not on file  . Stress: Not on file  Relationships  . Social connections:    Talks on phone: Not on file    Gets together: Not on file  Attends religious service: Not on file    Active member of club or organization: Not on file    Attends meetings of clubs or organizations: Not on file    Relationship status: Not on file  Other Topics Concern  . Not on file  Social History Narrative   Originally from [place]   Came to [employer]. When 23 years old.     Lives with parents and 10 yo sister.    Additional Social History:                         Sleep: Good  Appetite:  Good  Current Medications: Current Facility-Administered Medications  Medication Dose Route Frequency Provider Last Rate Last Dose  . acetaminophen (TYLENOL) tablet 650 mg  650 mg Oral Q6H PRN Malvin Johns, MD      . alum & mag hydroxide-simeth (MAALOX/MYLANTA) 200-200-20 MG/5ML suspension 30 mL  30 mL Oral Q4H PRN Malvin Johns,  MD      . benztropine (COGENTIN) tablet 0.5 mg  0.5 mg Oral BID Malvin Johns, MD   0.5 mg at 03/20/19 4034  . clonazePAM (KLONOPIN) tablet 1 mg  1 mg Oral TID Malvin Johns, MD   1 mg at 03/20/19 7425  . hydrOXYzine (ATARAX/VISTARIL) tablet 50 mg  50 mg Oral TID PRN Malvin Johns, MD      . magnesium hydroxide (MILK OF MAGNESIA) suspension 30 mL  30 mL Oral Daily PRN Malvin Johns, MD      . risperiDONE (RISPERDAL) tablet 3 mg  3 mg Oral BID Malvin Johns, MD   3 mg at 03/20/19 9563  . temazepam (RESTORIL) capsule 30 mg  30 mg Oral QHS Malvin Johns, MD      . traZODone (DESYREL) tablet 200 mg  200 mg Oral QHS PRN Malvin Johns, MD        Lab Results:  Results for orders placed or performed during the hospital encounter of 03/18/19 (from the past 48 hour(s))  Comprehensive metabolic panel     Status: Abnormal   Collection Time: 03/18/19  2:56 PM  Result Value Ref Range   Sodium 139 135 - 145 mmol/L   Potassium 3.1 (L) 3.5 - 5.1 mmol/L   Chloride 105 98 - 111 mmol/L   CO2 22 22 - 32 mmol/L   Glucose, Bld 128 (H) 70 - 99 mg/dL   BUN 14 6 - 20 mg/dL   Creatinine, Ser 8.75 0.61 - 1.24 mg/dL   Calcium 9.0 8.9 - 64.3 mg/dL   Total Protein 7.9 6.5 - 8.1 g/dL   Albumin 4.8 3.5 - 5.0 g/dL   AST 21 15 - 41 U/L   ALT 22 0 - 44 U/L   Alkaline Phosphatase 63 38 - 126 U/L   Total Bilirubin 0.8 0.3 - 1.2 mg/dL   GFR calc non Af Amer >60 >60 mL/min   GFR calc Af Amer >60 >60 mL/min   Anion gap 12 5 - 15    Comment: Performed at [facility], [address], [place], [place] [address]  Ethanol     Status: None   Collection Time: 03/18/19  2:56 PM  Result Value Ref Range   Alcohol, Ethyl (B) <10 <10 mg/dL    Comment: (NOTE) Lowest detectable limit for serum alcohol is 10 mg/dL. For medical purposes only. Performed at [facility], [address], [place], [place] [address]   Acetaminophen level     Status:  Abnormal   Collection Time: 03/18/19  2:56 PM   Result Value Ref Range   Acetaminophen (Tylenol), Serum <10 (L) 10 - 30 ug/mL    Comment: (NOTE) Therapeutic concentrations vary significantly. A range of 10-30 ug/mL  may be an effective concentration for many patients. However, some  are best treated at concentrations outside of this range. Acetaminophen concentrations >150 ug/mL at 4 hours after ingestion  and >50 ug/mL at 12 hours after ingestion are often associated with  toxic reactions. Performed at [facility], [address], [place], [place] [address]   Salicylate level     Status: None   Collection Time: 03/18/19  2:56 PM  Result Value Ref Range   Salicylate Lvl <7.0 2.8 - 30.0 mg/dL    Comment: Performed at [facility], [address], [place], [place] [address]  CBC with Differential     Status: Abnormal   Collection Time: 03/18/19  2:56 PM  Result Value Ref Range   WBC 8.7 4.0 - 10.5 K/uL   RBC 5.11 4.22 - 5.81 MIL/uL   Hemoglobin 15.2 13.0 - 17.0 g/dL   HCT 09.8 11.9 - 14.7 %   MCV 92.4 80.0 - 100.0 fL   MCH 29.7 26.0 - 34.0 pg   MCHC 32.2 30.0 - 36.0 g/dL   RDW 82.9 56.2 - 13.0 %   Platelets 300 150 - 400 K/uL   nRBC 0.0 0.0 - 0.2 %   Neutrophils Relative % 34 %   Neutro Abs 3.0 1.7 - 7.7 K/uL   Lymphocytes Relative 58 %   Lymphs Abs 5.0 (H) 0.7 - 4.0 K/uL   Monocytes Relative 7 %   Monocytes Absolute 0.6 0.1 - 1.0 K/uL   Eosinophils Relative 1 %   Eosinophils Absolute 0.1 0.0 - 0.5 K/uL   Basophils Relative 0 %   Basophils Absolute 0.0 0.0 - 0.1 K/uL   Immature Granulocytes 0 %   Abs Immature Granulocytes 0.01 0.00 - 0.07 K/uL    Comment: Performed at [facility] [address], [place], [place] [address]  Urinalysis, Routine w reflex microscopic     Status: None   Collection Time: 03/18/19  4:08 PM  Result Value Ref Range   Color, Urine YELLOW YELLOW   APPearance CLEAR CLEAR   Specific Gravity, Urine 1.021 1.005 - 1.030   pH 5.0 5.0 - 8.0    Glucose, UA NEGATIVE NEGATIVE mg/dL   Hgb urine dipstick NEGATIVE NEGATIVE   Bilirubin Urine NEGATIVE NEGATIVE   Ketones, ur NEGATIVE NEGATIVE mg/dL   Protein, ur NEGATIVE NEGATIVE mg/dL   Nitrite NEGATIVE NEGATIVE   Leukocytes,Ua NEGATIVE NEGATIVE    Comment: Performed at [facility], [address] [place], [place] [address]  Urine rapid drug screen (hosp performed)     Status: None   Collection Time: 03/18/19  4:08 PM  Result Value Ref Range   Opiates NONE DETECTED NONE DETECTED   Cocaine NONE DETECTED NONE DETECTED   Benzodiazepines NONE DETECTED NONE DETECTED   Amphetamines NONE DETECTED NONE DETECTED   Tetrahydrocannabinol NONE DETECTED NONE DETECTED   Barbiturates NONE DETECTED NONE DETECTED    Comment: (NOTE) DRUG SCREEN FOR MEDICAL PURPOSES ONLY.  IF CONFIRMATION IS NEEDED FOR ANY PURPOSE, NOTIFY LAB WITHIN 5 DAYS. LOWEST DETECTABLE LIMITS FOR URINE DRUG SCREEN Drug Class                     Cutoff (ng/mL) Amphetamine and  metabolites    1000 Barbiturate and metabolites    200 Benzodiazepine                 200 Tricyclics and metabolites     300 Opiates and metabolites        300 Cocaine and metabolites        300 THC                            50 Performed at [facility], [address] [place], [place] [id number]     Blood Alcohol level:  Lab Results  Component Value Date   [facility]   ETH <10 04/05/2018    Metabolic Disorder Labs: Lab Results  Component Value Date   HGBA1C 5.4 06/01/2017   MPG 108 06/01/2017   Lab Results  Component Value Date   PROLACTIN 31.5 (H) 06/01/2017   PROLACTIN 34.6 (H) 03/17/2016   Lab Results  Component Value Date   CHOL 182 06/01/2017   TRIG 75 06/01/2017   HDL 36 (L) 06/01/2017   CHOLHDL 5.1 06/01/2017   VLDL 15 06/01/2017   LDLCALC 131 (H) 06/01/2017   LDLCALC 106 03/01/2016    Physical Findings: AIMS: Facial and Oral Movements Muscles of Facial  Expression: None, normal Lips and Perioral Area: None, normal Jaw: None, normal Tongue: None, normal,Extremity Movements Upper (arms, wrists, hands, fingers): None, normal Lower (legs, knees, ankles, toes): None, normal, Trunk Movements Neck, shoulders, hips: None, normal, Overall Severity Severity of abnormal movements (highest score from questions above): None, normal Incapacitation due to abnormal movements: None, normal Patient's awareness of abnormal movements (rate only patient's report): No Awareness, Dental Status Current problems with teeth and/or dentures?: No Does patient usually wear dentures?: No  CIWA:    COWS:     Musculoskeletal: Strength & Muscle Tone: within normal limits Gait & Station: normal Patient leans: N/A  Psychiatric Specialty Exam: Physical Exam  ROS  Blood pressure 98/81, pulse (!) 125, temperature 97.6 F (36.4 C), temperature source Oral, resp. rate 18, height 5\' 6"  (1.676 m), weight 64 kg, SpO2 100 %.Body mass index is 22.76 kg/m.  General Appearance: Casual/disheveled  Eye Contact:  Minimal  Speech:  Pressured  Volume:  Decreased  Mood:  Anxious  Affect:  Congruent  Thought Process:  Irrelevant and Descriptions of Associations: Loose  Orientation:  Full (Time, Place, and Person)  Thought Content:  Paranoid Ideation  Suicidal Thoughts:  No  Homicidal Thoughts:  No  Memory:  Immediate;   Fair  Judgement:  Impaired  Insight:  Shallow  Psychomotor Activity:  Decreased  Concentration:  Concentration: Fair  Recall:  Fair  Fund of Knowledge:  Good  Language:  Good  Akathisia:  Negative  Handed:  Right  AIMS (if indicated):     Assets:  Financial Resources/Insurance Housing Physical Health Resilience  ADL's:  Intact  Cognition:  WNL  Sleep:  Number of Hours: 6.5  Mental status exam marked by contradictory information so we will defer to the answers that presume psychosis given the severity of his recent presentation.   Treatment Plan  Summary: Daily contact with patient to assess and evaluate symptoms and progress in treatment, Medication management and Plan Continue antipsychotic therapy with adjustments continue augmentation strategies continue reality-based therapy and current precautions prepare for long-acting injectable discussed briefly with him again as per the mental status exam gives me contradictory information saying he is had shots  before for psychosis then stating he is never had a long-acting injectable.  Malvin Johns, MD 03/20/2019, 10:00 AM

## 2019-03-20 NOTE — Progress Notes (Signed)
Dar Note: Patient presents with anxious affect and disorganized behavior.  Denies suicidal thoughts, auditory and visual hallucinations but patient is observed responding to internal stimuli.  Medication given with lot of encouragements.  Appears guarded and suspicious.  Routine safety checks maintained every 15 minutes.  Patient is safe on the unit.

## 2019-03-21 MED ORDER — ARIPIPRAZOLE 2 MG PO TABS
2.0000 mg | ORAL_TABLET | Freq: Once | ORAL | Status: AC
  Administered 2019-03-21: 2 mg via ORAL
  Filled 2019-03-21 (×2): qty 1

## 2019-03-21 NOTE — [facility] Group Notes (Signed)
  [facility] LCSW Group Therapy Note  Date/Time:  03/21/2019 11:15AM-12:00PM  Type of Therapy and Topic:  Group Therapy:  Feelings About Hospitalization  Participation Level:  Did Not Attend   Description of Group This process group involved patients discussing their feelings related to being hospitalized, as well as the benefits they see to being in the hospital.  These feelings and benefits were itemized.  The group then brainstormed specific ways in which they could seek those same benefits when they discharge and return home.  An emphasis was placed on the difference now with the social distancing mandates in place and how to overcome those.  Therapeutic Goals 1. Patient will identify and describe positive and negative feelings related to hospitalization 2. Patient will verbalize benefits of hospitalization to themselves personally 3. Patients will brainstorm together ways they can obtain similar benefits in the outpatient setting, identify barriers to wellness and possible solutions  Summary of Patient Progress:  The patient was invited to group, declined  Therapeutic Modalities Cognitive Behavioral Therapy Motivational Interviewing    Ambrose Mantle, LCSW 03/21/2019, 1:06 PM

## 2019-03-21 NOTE — [facility] Group Notes (Signed)
[facility] Group Notes:  (Nursing/MHT/Case Management/Adjunct)  Date:  03/21/2019  Time:  4:00 PM  Type of Therapy:  Nurse Education  Participation Level:  Did Not Attend   Raylene Miyamoto 03/21/2019, 5:21 PM

## 2019-03-21 NOTE — [facility] Counselor (Signed)
Adult Comprehensive Assessment  Patient ID: Todd [name], male   DOB: [birth year], 23 y.o.   MRN: [id number]  Information Source: Information source: Patient  Current Stressors:  Patient states their primary concerns and needs for treatment are:: "Trouble breathing" Patient states their goals for this hospitilization and ongoing recovery are:: "Get out, get ready for real life." Educational / Learning stressors: Denies stressors Employment / Job issues: States he is not working and this is stressful, "but not really." Family Relationships: Denies stressors but does state that his family sometimes asks him to move out of the house. Financial / Lack of resources (include bankruptcy): Has no money of his own. Housing / Lack of housing: Sometimes family asks him to move out of the house. Physical health (include injuries & life threatening diseases): Denies stressors Social relationships: Denies stressors Substance abuse: Denies use for the last 12 months Bereavement / Loss: Denies stressors  Living/Environment/Situation:  Living Arrangements: Parent, Other relatives Living conditions (as described by patient or guardian): Good, has his own room Who else lives in the home?: Mother, father, sister How long has patient lived in current situation?: Whole life What is atmosphere in current home: Comfortable, Loving  Family History:  Marital status: Single What is your sexual orientation?: Heterosexual Does patient have children?: No  Childhood History:  By whom was/is the patient raised?: Both parents Description of patient's relationship with caregiver when they were a child: Good with both Patient's description of current relationship with people who raised him/her: Okay with both How were you disciplined when you got in trouble as a child/adolescent?: Not answered Does patient have siblings?: Yes Number of Siblings: 1 Description of patient's current relationship  with siblings: Sister is very supportive and helpful Did patient suffer any verbal/emotional/physical/sexual abuse as a child?: No(Past record indicates possible history of toddler abuse, but he denies) Did patient suffer from severe childhood neglect?: No Has patient ever been sexually abused/assaulted/raped as an adolescent or adult?: No Was the patient ever a victim of a crime or a disaster?: No Witnessed domestic violence?: No Has patient been effected by domestic violence as an adult?: No  Education:  Highest grade of school patient has completed: High school, graduated from [employer] Currently a student?: No Learning disability?: No  Employment/Work Situation:   Employment situation: Unemployed What is the longest time patient has a held a job?: almost 2 years 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  Did You Receive Any Psychiatric Treatment/Services While in Equities trader?: (No Financial planner) Are There Guns or Education officer, community in Your Home?: No  Financial Resources:   Surveyor, quantity resources: Support from parents / caregiver Does patient have a Lawyer or guardian?: No  Alcohol/Substance Abuse:   What has been your use of drugs/alcohol within the last 12 months?: Denies using any marijuana or alcohol in the last 12 months Alcohol/Substance Abuse Treatment Hx: Denies past history Has alcohol/substance abuse ever caused legal problems?: Yes  Social Support System:   Patient's Community Support System: Good Describe Community Support System: "A lot of people."  Includes parents and sister. Type of faith/religion: None How does patient's faith help to cope with current illness?: None  Leisure/Recreation:   Leisure and Hobbies: Walking outside, soccer sometimes, listening to music at times  Strengths/Needs:   What is the patient's perception of their strengths?: "I don't know." Patient states they can use these personal  strengths during their treatment to contribute to their recovery:  N/A Patient states these barriers may affect/interfere with their treatment: Does not want treatment. Patient states these barriers may affect their return to the community: None Other important information patient would like considered in planning for their treatment: None  Discharge Plan:   Currently receiving community mental health services: No Patient states concerns and preferences for aftercare planning are: Reluctant to follow up, does not feel he needs medicine.  Used to go to [facility]. Patient states they will know when they are safe and ready for discharge when: Feels he is ready to discharge today, wants to see doctor to discuss this. Does patient have access to transportation?: Yes Does patient have financial barriers related to discharge medications?: Yes Patient description of barriers related to discharge medications: No income, no insurance Will patient be returning to same living situation after discharge?: Yes  Summary/Recommendations:   Summary and Recommendations (to be completed by the evaluator): Patient is a 22yo male admitted under IVC with psychosis and aggression, swinging a knife "to get rid of demons" while endangering people around him.  He has a history of Bipolar disorder and Schizophrenia with multiple hospitalizations, as well as a history of non-adherence to prescription medications.  Primary stressors include discontinuation of medicines due to side-effects such as weight gain, and subsequent increase in auditory/visual hallucinations, paranoia, aggression toward mother and sister, insomnia, isolation, and disorganized thought processes.  He has a history of using alcohol and marijuana but states he has not used for some time.  He has a history of traveling outside the state without money or a plan, has had to be reported as missing by family in the past.  He is resistant to the idea of needing  treatment at this time.    Patient will benefit from crisis stabilization, medication evaluation, group therapy and psychoeducation, in addition to case management for discharge planning. At discharge it is recommended that Patient adhere to the established discharge plan and continue in treatment.    Lynnell Chad. 03/21/2019

## 2019-03-21 NOTE — Progress Notes (Signed)
Writer observed patient sitting on his bench in his room shortly after getting off the hall phone. He is isolative to his room.When writer asked how his day had been he reported that he would let us tell him when he is ready to go. Writer informed him of medication for tonight and he reported that he did not want any medicine to help him rest because it makes his eyes feel bad in the morning. Writer informed him that after he tries to sleep on his own and if he has trouble medication can be taken. He still reported that he does not want it. Support given and safety maintained on unit with 15 min checks.

## 2019-03-21 NOTE — Plan of Care (Signed)
Progress note  D: pt found in bed; compliant with medication administration. Pt is intrusive and continues to ask to be discharged. Pt denies si/hi/ah/vh and verbally agrees to approach staff if these become apparent or before harming himself/others while at [facility]. Pt is still paranoid and seems to be responding to internal stimuli/visual hallucinations. Pt was witnessed blowing a kiss and waving a someone in his room from the hall multiple times to day.  A: pt provided support and encouragement. Pt given medication per protocol and standing orders. Q38m safety checks implemented and continued.  R: pt safe on the unit. Will continue to monitor.   Pt progressing in the following metrics  Problem: Education: Goal: Knowledge of [facility] Education information/materials will improve Outcome: Progressing Goal: Emotional status will improve Outcome: Progressing Goal: Mental status will improve Outcome: Progressing Goal: Verbalization of understanding the information provided will improve Outcome: Progressing

## 2019-03-21 NOTE — Progress Notes (Signed)
[facility] MD Progress Note  03/21/2019 9:57 AM Todd [name]  MRN:  [id number] Subjective:   Patient continues to be very needy he request to interview a second time even after his had his morning interview and mental status exam but he is very vague and has nothing new to contribute just wants to see me over and over again that he also asks about when he can go home repeatedly. More specifically is oriented to person place general situation he knows it is the fourth month of the year 2020 he is not sure of the exact day or date. He continues to deny current auditory and visual hallucinations. Give social work permission to talk to his mother with translator recommended No EPS or TD does not appear to be responding to stimuli just needy, intrusive, and some poverty of content when questioned in detail  Principal Problem: Severe manic bipolar 1 disorder with psychotic behavior (HCC) Diagnosis: Principal Problem:   Severe manic bipolar 1 disorder with psychotic behavior (HCC)  Total Time spent with patient: 20 minutes Past Medical History:  Past Medical History:  Diagnosis Date  . Bipolar 1 disorder (HCC)    History reviewed. No pertinent surgical history. Family History:  Family History  Problem Relation Age of Onset  . Hyperlipidemia Father   . Diabetes Maternal Grandmother   . Alcohol abuse Paternal Grandfather    Social History:  Social History   Substance and Sexual Activity  Alcohol Use Yes  . Alcohol/week: 0.0 standard drinks   Comment: Beer and Tequila on weekends.  3 beers and 4 shots of Tequila over 2 weekends per month     Social History   Substance and Sexual Activity  Drug Use Yes  . Types: Marijuana   Comment: Marijuana:  starts and stops.  When smoking, daily.  Tested every 2 weeks due to taking prescription drug abuse--stopped in [place].  Goes throught 05/2016. Has used MJ since 6th grade.  Went to [employer] a lot daily.    Social  History   Socioeconomic History  . Marital status: Single    Spouse name: Not on file  . Number of children: 0  . Years of education: Not on file  . Highest education level: Not on file  Occupational History  . 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 Financial resource strain: Not on file  . Food insecurity:    Worry: Not on file    Inability: Not on file  . Transportation needs:    Medical: Not on file    Non-medical: Not on file  Tobacco Use  . Smoking status: Former Smoker    Packs/day: 0.00  . Smokeless tobacco: Never Used  Substance and Sexual Activity  . Alcohol use: Yes    Alcohol/week: 0.0 standard drinks    Comment: Beer and Tequila on weekends.  3 beers and 4 shots of Tequila over 2 weekends per month  . Drug use: Yes    Types: Marijuana    Comment: Marijuana:  starts and stops.  When smoking, daily.  Tested every 2 weeks due to taking prescription drug abuse--stopped in [place].  Goes throught 05/2016. Has used MJ since 6th grade.  Went to [employer] a lot daily.  Marland Kitchen Sexual activity: Yes    Partners: Female    Birth control/protection: Condom  Lifestyle  . Physical activity:    Days per week: Not on file    Minutes per session: Not on  file  . Stress: Not on file  Relationships  . Social connections:    Talks on phone: Not on file    Gets together: Not on file    Attends religious service: Not on file    Active member of club or organization: Not on file    Attends meetings of clubs or organizations: Not on file    Relationship status: Not on file  Other Topics Concern  . Not on file  Social History Narrative   Originally from [place]   Came to [employer]. When 23 years old.     Lives with parents and 47 yo sister.    Additional Social History:                         Sleep: Good  Appetite:  Good  Current Medications: Current Facility-Administered Medications  Medication Dose Route Frequency Provider Last Rate Last  Dose  . acetaminophen (TYLENOL) tablet 650 mg  650 mg Oral Q6H PRN Malvin Johns, MD      . alum & mag hydroxide-simeth (MAALOX/MYLANTA) 200-200-20 MG/5ML suspension 30 mL  30 mL Oral Q4H PRN Malvin Johns, MD      . ARIPiprazole (ABILIFY) tablet 2 mg  2 mg Oral Once Malvin Johns, MD      . benztropine (COGENTIN) tablet 0.5 mg  0.5 mg Oral BID Malvin Johns, MD   0.5 mg at 03/21/19 0745  . clonazePAM (KLONOPIN) tablet 1 mg  1 mg Oral TID Malvin Johns, MD   1 mg at 03/21/19 0745  . hydrOXYzine (ATARAX/VISTARIL) tablet 50 mg  50 mg Oral TID PRN Malvin Johns, MD      . magnesium hydroxide (MILK OF MAGNESIA) suspension 30 mL  30 mL Oral Daily PRN Malvin Johns, MD      . risperiDONE (RISPERDAL) tablet 3 mg  3 mg Oral TID Malvin Johns, MD   3 mg at 03/21/19 0745  . temazepam (RESTORIL) capsule 30 mg  30 mg Oral QHS Malvin Johns, MD   30 mg at 03/20/19 2003  . traZODone (DESYREL) tablet 200 mg  200 mg Oral QHS PRN Malvin Johns, MD        Lab Results: No results found for this or any previous visit (from the past 48 hour(s)).  Blood Alcohol level:  Lab Results  Component Value Date   ETH <10 03/18/2019   ETH <10 04/05/2018    Metabolic Disorder Labs: Lab Results  Component Value Date   HGBA1C 5.4 06/01/2017   MPG 108 06/01/2017   Lab Results  Component Value Date   PROLACTIN 31.5 (H) 06/01/2017   PROLACTIN 34.6 (H) 03/17/2016   Lab Results  Component Value Date   CHOL 182 06/01/2017   TRIG 75 06/01/2017   HDL 36 (L) 06/01/2017   CHOLHDL 5.1 06/01/2017   VLDL 15 06/01/2017   LDLCALC 131 (H) 06/01/2017   LDLCALC 106 03/01/2016    Physical Findings: AIMS: Facial and Oral Movements Muscles of Facial Expression: None, normal Lips and Perioral Area: None, normal Jaw: None, normal Tongue: None, normal,Extremity Movements Upper (arms, wrists, hands, fingers): None, normal Lower (legs, knees, ankles, toes): None, normal, Trunk Movements Neck, shoulders, hips: None, normal, Overall  Severity Severity of abnormal movements (highest score from questions above): None, normal Incapacitation due to abnormal movements: None, normal Patient's awareness of abnormal movements (rate only patient's report): No Awareness, Dental Status Current problems with teeth and/or dentures?: No Does patient usually wear dentures?:  No  CIWA:    COWS:     Musculoskeletal: Strength & Muscle Tone: within normal limits Gait & Station: normal Patient leans: N/A  Psychiatric Specialty Exam: Physical Exam  ROS  Blood pressure 96/70, pulse (!) 136, temperature 97.8 F (36.6 C), resp. rate 17, height  (1.676 m), weight 64 kg, SpO2 100 %.Body mass index is 22.76 kg/m.  General Appearance: Casual  Eye Contact:  Fair  Speech:  Clear and Coherent  Volume:  Normal  Mood:  Anxious and Dysphoric  Affect:  Appropriate and Congruent  Thought Process:  Irrelevant  Orientation:  Full (Time, Place, and Person)  Thought Content:  Paranoid Ideation and Tangential  Suicidal Thoughts:  No  Homicidal Thoughts:  No  Memory:  Immediate;   Fair  Judgement:  Fair  Insight:  Fair  Psychomotor Activity:  Normal  Concentration:  Concentration: Fair  Recall:  [employer] of Knowledge:  Fair  Language:  Good  Akathisia:  Negative  Handed:  Right  AIMS (if indicated):     Assets:  Leisure Time Physical Health  ADL's:  Intact  Cognition:  WNL  Sleep:  Number of Hours: 6.25     Treatment Plan Summary: Daily contact with patient to assess and evaluate symptoms and progress in treatment, Medication management and Plan Patient will be continued on Risperdal therapy but given a test dose of aripiprazole in anticipation of long-acting injectable medication continue current cognitive-based therapy and reality based therapies, I would describe is mildly paranoid without form delusions at the present time and denying hallucinations  Lusine Corlett, MD 03/21/2019, 9:57 AM

## 2019-03-22 MED ORDER — POTASSIUM CHLORIDE CRYS ER 20 MEQ PO TBCR
20.0000 meq | EXTENDED_RELEASE_TABLET | Freq: Two times a day (BID) | ORAL | Status: AC
  Administered 2019-03-22 – 2019-03-23 (×2): 20 meq via ORAL
  Filled 2019-03-22 (×5): qty 1

## 2019-03-22 NOTE — [facility] Group Notes (Signed)
[facility] LCSW Group Therapy Note  Date/Time:  03/22/2019  11:00AM-12:00PM  Type of Therapy and Topic:  Group Therapy:  Music and Mood  Participation Level:  None   Description of Group: In this process group, members listened to a variety of genres of music and identified that different types of music evoke different responses.  Patients were encouraged to identify music that was soothing for them and music that was energizing for them.  Patients discussed how this knowledge can help with wellness and recovery in various ways including managing depression and anxiety as well as encouraging healthy sleep habits.    Therapeutic Goals: 1. Patients will explore the impact of different varieties of music on mood 2. Patients will verbalize the thoughts they have when listening to different types of music 3. Patients will identify music that is soothing to them as well as music that is energizing to them 4. Patients will discuss how to use this knowledge to assist in maintaining wellness and recovery 5. Patients will explore the use of music as a coping skill  Summary of Patient Progress:   Patient was not present at the beginning of group.  He came in at the 30-minute point and was in and out of the room several times.  He did not talk at all.  Therapeutic Modalities: Solution Focused Brief Therapy Activity   Todd Mantle, LCSW

## 2019-03-22 NOTE — Plan of Care (Signed)
D: Patient in bed awake on approach. Patient is confused and delusional but cooperative this evening. Endorses auditory hallucinations but acts like they're not bothersome. Denies SI, HI, VH, and verbally contracts for safety. Patient denies physical symptoms/pain.    A: Medications administered per MD order. Support provided. Patient educated on safety on the unit and medications. Routine safety checks every 15 minutes. Patient stated understanding to tell nurse about any new physical symptoms. Patient understands to tell staff of any needs.     R: No adverse drug reactions noted. Patient verbally contracts for safety. Patient remains safe at this time and will continue to monitor.   Problem: Safety: Goal: Periods of time without injury will increase Outcome: Progressing   Patient remains safe and will continue to monitor.

## 2019-03-22 NOTE — Progress Notes (Addendum)
[facility] MD Progress Note  03/22/2019 8:21 AM Todd [name]  MRN:  [id number]   Subjective: Todd [name] reported " I am ready to go home but I need xanax first. "   Evaluation:  Todd [name] observed sitting on the side of the bed.  He is awake, alert and oriented x3.  Patient appears to be fixated with Xanax for sleep.  Reported coming to the hospital for "relaxation and to volunteer" stated he feels ready to discharge. Patient observed responding to internal stimuli. Todd [name] denies suicidal or homicidal ideations.  Denies auditory visual hallucinations.  Staff reported low potassium at 3.1.  NP will prescribe K-Dur 20 meq oral repeat K levl 03/24/2019.  Patient presents disorganized, delusional and thought blocking throughout this assessment.  Reported taking medications as prescribed denies medication side effects.  Support encouragement reassurance was provided.   History: Per admission assessment note: This is a repeat admission for Todd [name], 23 yo patient is known to have a history of a schizoaffective disorder, bipolar type with psychosis.He required petition for involuntary commitment due to aggressiveness, cluster behaviors driven by his psychotic symptoms, med noncompliance.  Swinging a knife in the ear to get rid of demons reportedly and is not been compliant with his Risperdal.He has been hospitalized here in 2018 as well as 2019, and on my evaluation he refuses to cooperate states "you are not my doctor" points to the nurse and states "he is my doctor" states "leave me alone" he is very paranoid will not come into an exam room insists on talking in the hallway.     Principal Problem: Severe manic bipolar 1 disorder with psychotic behavior (HCC) Diagnosis: Principal Problem:   Severe manic bipolar 1 disorder with psychotic behavior (HCC)  Total Time spent with patient: 20 minutes Past Medical History:  Past Medical History:  Diagnosis Date  . Bipolar 1 disorder (HCC)    History  reviewed. No pertinent surgical history. Family History:  Family History  Problem Relation Age of Onset  . Hyperlipidemia Father   . Diabetes Maternal Grandmother   . Alcohol abuse Paternal Grandfather    Social History:  Social History   Substance and Sexual Activity  Alcohol Use Yes  . Alcohol/week: 0.0 standard drinks   Comment: Beer and Tequila on weekends.  3 beers and 4 shots of Tequila over 2 weekends per month     Social History   Substance and Sexual Activity  Drug Use Yes  . Types: Marijuana   Comment: Marijuana:  starts and stops.  When smoking, daily.  Tested every 2 weeks due to taking prescription drug abuse--stopped in [place].  Goes throught 05/2016. Has used MJ since 6th grade.  Went to [employer] a lot daily.    Social History   Socioeconomic History  . Marital status: Single    Spouse name: Not on file  . Number of children: 0  . Years of education: Not on file  . Highest education level: Not on file  Occupational History  . 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 Financial resource strain: Not on file  . Food insecurity:    Worry: Not on file    Inability: Not on file  . Transportation needs:    Medical: Not on file    Non-medical: Not on file  Tobacco Use  . Smoking status: Former Smoker    Packs/day: 0.00  . Smokeless tobacco: Never Used  Substance and Sexual Activity  . Alcohol use:  Yes    Alcohol/week: 0.0 standard drinks    Comment: Beer and Tequila on weekends.  3 beers and 4 shots of Tequila over 2 weekends per month  . Drug use: Yes    Types: Marijuana    Comment: Marijuana:  starts and stops.  When smoking, daily.  Tested every 2 weeks due to taking prescription drug abuse--stopped in [place].  Goes throught 05/2016. Has used MJ since 6th grade.  Went to [employer] a lot daily.  Todd [name] Kitchen Sexual activity: Yes    Partners: Female    Birth control/protection: Condom  Lifestyle  . Physical  activity:    Days per week: Not on file    Minutes per session: Not on file  . Stress: Not on file  Relationships  . Social connections:    Talks on phone: Not on file    Gets together: Not on file    Attends religious service: Not on file    Active member of club or organization: Not on file    Attends meetings of clubs or organizations: Not on file    Relationship status: Not on file  Other Topics Concern  . Not on file  Social History Narrative   Originally from [place]   Came to [employer]. When 23 years old.     Lives with parents and 41 yo sister.    Additional Social History:                         Sleep: Good  Appetite:  Good  Current Medications: Current Facility-Administered Medications  Medication Dose Route Frequency Provider Last Rate Last Dose  . acetaminophen (TYLENOL) tablet 650 mg  650 mg Oral Q6H PRN Malvin Johns, MD      . alum & mag hydroxide-simeth (MAALOX/MYLANTA) 200-200-20 MG/5ML suspension 30 mL  30 mL Oral Q4H PRN Malvin Johns, MD      . benztropine (COGENTIN) tablet 0.5 mg  0.5 mg Oral BID Malvin Johns, MD   0.5 mg at 03/21/19 1654  . hydrOXYzine (ATARAX/VISTARIL) tablet 50 mg  50 mg Oral TID PRN Malvin Johns, MD      . magnesium hydroxide (MILK OF MAGNESIA) suspension 30 mL  30 mL Oral Daily PRN Malvin Johns, MD      . risperiDONE (RISPERDAL) tablet 3 mg  3 mg Oral TID Malvin Johns, MD   3 mg at 03/21/19 1654  . temazepam (RESTORIL) capsule 30 mg  30 mg Oral QHS Malvin Johns, MD   30 mg at 03/20/19 2003  . traZODone (DESYREL) tablet 200 mg  200 mg Oral QHS PRN Malvin Johns, MD        Lab Results: No results found for this or any previous visit (from the past 48 hour(s)).  Blood Alcohol level:  Lab Results  Component Value Date   ETH <10 03/18/2019   ETH <10 04/05/2018    Metabolic Disorder Labs: Lab Results  Component Value Date   HGBA1C 5.4 06/01/2017   MPG 108 06/01/2017   Lab Results  Component Value Date   PROLACTIN 31.5 (H)  06/01/2017   PROLACTIN 34.6 (H) 03/17/2016   Lab Results  Component Value Date   CHOL 182 06/01/2017   TRIG 75 06/01/2017   HDL 36 (L) 06/01/2017   CHOLHDL 5.1 06/01/2017   VLDL 15 06/01/2017   LDLCALC 131 (H) 06/01/2017   LDLCALC 106 03/01/2016    Physical Findings: AIMS: Facial and Oral Movements Muscles of  Facial Expression: None, normal Lips and Perioral Area: None, normal Jaw: None, normal Tongue: None, normal,Extremity Movements Upper (arms, wrists, hands, fingers): None, normal Lower (legs, knees, ankles, toes): None, normal, Trunk Movements Neck, shoulders, hips: None, normal, Overall Severity Severity of abnormal movements (highest score from questions above): None, normal Incapacitation due to abnormal movements: None, normal Patient's awareness of abnormal movements (rate only patient's report): No Awareness, Dental Status Current problems with teeth and/or dentures?: No Does patient usually wear dentures?: No  CIWA:    COWS:     Musculoskeletal: Strength & Muscle Tone: within normal limits Gait & Station: normal Patient leans: N/A  Psychiatric Specialty Exam: Physical Exam  Constitutional: He appears well-developed.  Psychiatric: He has a normal mood and affect. His behavior is normal.    Review of Systems  Psychiatric/Behavioral: Positive for hallucinations. Negative for depression. The patient is nervous/anxious.   All other systems reviewed and are negative.   Blood pressure 96/71, pulse (!) 148, temperature (!) 97.3 F (36.3 C), temperature source Oral, resp. rate 14, height  (1.676 m), weight 64 kg, SpO2 100 %.Body mass index is 22.76 kg/m.  General Appearance: Casual  Eye Contact:  Fair  Speech:  Clear and Coherent  Volume:  Normal  Mood:  Anxious and Dysphoric  Affect:  Appropriate and Congruent  Thought Process:  Irrelevant  Orientation:  Full (Time, Place, and Person)  Thought Content:  Paranoid Ideation and Tangential  Suicidal  Thoughts:  No  Homicidal Thoughts:  No  Memory:  Immediate;   Fair  Judgement:  Fair  Insight:  Fair  Psychomotor Activity:  Normal  Concentration:  Concentration: Fair  Recall:  [employer] of Knowledge:  Fair  Language:  Good  Akathisia:  Negative  Handed:  Right  AIMS (if indicated):     Assets:  Leisure Time Physical Health  ADL's:  Intact  Cognition:  WNL  Sleep:  Number of Hours: 6.25     Treatment Plan Summary: Daily contact with patient to assess and evaluate symptoms and progress in treatment and Medication management   Continue with current treatment plan on 03/22/2019 as listed below except for noted  Schizophrenia(undifferentiated)  Continue Resporal 3 mg p.o. 3 times daily Continue Cogentin 0.5 mg p.o. twice daily Continue Restoril 30 mg p.o. nightly Continue trazodone 200 mg p.o. nightly Continue Vistaril 50 mg p.o. 3 times daily PRN Continue Klonopin 1 mg p.o. 3 times daily  Initiated potassium chloride 20 mEq p.o. twice daily x4 doses-repeat CMP for 03/24/2019  CSW to continue working on discharge disposition Patient encouraged to participate with daily milieu  Oneta Rack, NP 03/22/2019, 8:21 AM  Attest to NP Progress Note

## 2019-03-22 NOTE — Progress Notes (Signed)
Pt presents with a flat affect. On approach, pt noted to be preoccupied. Pt expressed that he was hearing voices and having racing thoughts that were contributing to him having SI. Pt verbally contracted for safety and denied a suicide plan or intent. Pt then stated that he was ready to go home. Writer asked the pt, "why are you ready to go home if you continue to hear voices and have thoughts of suicide?" Pt then stated "oh I can handle the thoughts in my head. I wish I had a wife to go home too." Pt noted to have disorganized thoughts at times along with loose association.   Medications reviewed with pt and education provided. Verbal support provided. Pt encouraged to attend groups. 15 minute checks performed for safety.   Pt compliant with taking morning medications. Pt visible in the dayroom this am.

## 2019-03-23 MED ORDER — PRENATAL MULTIVITAMIN CH
1.0000 | ORAL_TABLET | Freq: Every day | ORAL | Status: DC
  Administered 2019-03-23 – 2019-03-27 (×4): 1 via ORAL
  Filled 2019-03-23: qty 10
  Filled 2019-03-23 (×6): qty 1

## 2019-03-23 MED ORDER — OMEGA-3-ACID ETHYL ESTERS 1 G PO CAPS
1.0000 g | ORAL_CAPSULE | Freq: Two times a day (BID) | ORAL | Status: DC
  Administered 2019-03-23 – 2019-03-27 (×6): 1 g via ORAL
  Filled 2019-03-23 (×2): qty 1
  Filled 2019-03-23: qty 20
  Filled 2019-03-23 (×6): qty 1
  Filled 2019-03-23: qty 20
  Filled 2019-03-23 (×3): qty 1

## 2019-03-23 MED ORDER — CELECOXIB 100 MG PO CAPS
200.0000 mg | ORAL_CAPSULE | Freq: Two times a day (BID) | ORAL | Status: DC
  Administered 2019-03-23 – 2019-03-27 (×6): 200 mg via ORAL
  Filled 2019-03-23: qty 1
  Filled 2019-03-23: qty 2
  Filled 2019-03-23: qty 40
  Filled 2019-03-23: qty 1
  Filled 2019-03-23: qty 2
  Filled 2019-03-23 (×5): qty 1
  Filled 2019-03-23: qty 2
  Filled 2019-03-23: qty 40
  Filled 2019-03-23 (×4): qty 1

## 2019-03-23 NOTE — [facility] Suicide Risk Assessment (Signed)
[facility] INPATIENT:  Family/Significant Other Suicide Prevention Education  Suicide Prevention Education:  Education Completed; Loney Loh, mother, [phone], has been identified by the patient as the family member/significant other with whom the patient will be residing, and identified as the person(s) who will aid the patient in the event of a mental health crisis (suicidal ideations/suicide attempt).  With written consent from the patient, the family member/significant other has been provided the following suicide prevention education, prior to the and/or following the discharge of the patient.  The suicide prevention education provided includes the following:  Suicide risk factors  Suicide prevention and interventions  National Suicide Hotline telephone number  [facility] assessment telephone number  [facility] Emergency Assistance 911  [facility] and/or Residential Mobile Crisis Unit telephone number  Request made of family/significant other to:  Remove weapons (e.g., guns, rifles, knives), all items previously/currently identified as safety concern.   No guns in the home, per mother.    Remove drugs/medications (over-the-counter, prescriptions, illicit drugs), all items previously/currently identified as a safety concern.  The family member/significant other verbalizes understanding of the suicide prevention education information provided.  The family member/significant other agrees to remove the items of safety concern listed above.  CSW spoke with mother with help of pacific interpretors.  Mother reports pt calls multiple times each day and she can tell he is still not doing well by some of the questions he asks.  Pt has refused to take medication and has been getting worse over the past 8-9 months. He has been seen at [facility] in the past.  Mother asked that we talk with pt about taking his medication so that he will be OK once he comes home.   Lorri Frederick, LCSW 03/23/2019, 3:57 PM

## 2019-03-23 NOTE — Plan of Care (Signed)
D: Patient is in his bed awake on approach with his blanket pulled up over his nose. Patient is more reluctant to talk or look at this RN than last night. Affect is paranoid, interaction is minimal, and speech is soft/slow almost incoherent. Endorses auditory hallucinations. Denies SI, HI, VH, and verbally contracts for safety. Patient states "I don't want any medicine you can leave now". Patient has his blanket pulled over his nose for entire assessment by this RN.    A: Scheduled medications not administered per MD order, patient refused. Support provided. Patient educated on safety on the unit and medications. Routine safety checks every 15 minutes. Patient stated understanding to tell nurse about any new physical symptoms. Patient understands to tell staff of any needs.     R: No adverse drug reactions noted. Patient verbally contracts for safety. Patient remains safe at this time and will continue to monitor.   Problem: Safety: Goal: Periods of time without injury will increase Outcome: Progressing   Patient remains safe and will continue to monitor.

## 2019-03-23 NOTE — Progress Notes (Signed)
[facility] MD Progress Note  03/23/2019 9:17 AM Todd [name]  MRN:  [id number] Subjective:    Remains singularly focused on discharge stating he was seeing things but not now and will not elaborate a bit intrusive and again focused only on leaving and denial of symptoms.  Clearly was dangerous prior to admission but lacking insight.  No EPS or TD compliant with meds thus far.  Principal Problem: Severe manic bipolar 1 disorder with psychotic behavior (HCC) Diagnosis: Principal Problem:   Severe manic bipolar 1 disorder with psychotic behavior (HCC)  Total Time spent with patient: 20 minutes  Past Medical History:  Past Medical History:  Diagnosis Date  . Bipolar 1 disorder (HCC)    History reviewed. No pertinent surgical history. Family History:  Family History  Problem Relation Age of Onset  . Hyperlipidemia Father   . Diabetes Maternal Grandmother   . Alcohol abuse Paternal Grandfather     Social History:  Social History   Substance and Sexual Activity  Alcohol Use Yes  . Alcohol/week: 0.0 standard drinks   Comment: Beer and Tequila on weekends.  3 beers and 4 shots of Tequila over 2 weekends per month     Social History   Substance and Sexual Activity  Drug Use Yes  . Types: Marijuana   Comment: Marijuana:  starts and stops.  When smoking, daily.  Tested every 2 weeks due to taking prescription drug abuse--stopped in [place].  Goes throught 05/2016. Has used MJ since 6th grade.  Went to [employer] a lot daily.    Social History   Socioeconomic History  . Marital status: Single    Spouse name: Not on file  . Number of children: 0  . Years of education: Not on file  . Highest education level: Not on file  Occupational History  . 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 Financial resource strain: Not on file  . Food insecurity:    Worry: Not on file    Inability: Not on file  . Transportation needs:    Medical: Not on  file    Non-medical: Not on file  Tobacco Use  . Smoking status: Former Smoker    Packs/day: 0.00  . Smokeless tobacco: Never Used  Substance and Sexual Activity  . Alcohol use: Yes    Alcohol/week: 0.0 standard drinks    Comment: Beer and Tequila on weekends.  3 beers and 4 shots of Tequila over 2 weekends per month  . Drug use: Yes    Types: Marijuana    Comment: Marijuana:  starts and stops.  When smoking, daily.  Tested every 2 weeks due to taking prescription drug abuse--stopped in [place].  Goes throught 05/2016. Has used MJ since 6th grade.  Went to [employer] a lot daily.  Marland Kitchen Sexual activity: Yes    Partners: Female    Birth control/protection: Condom  Lifestyle  . Physical activity:    Days per week: Not on file    Minutes per session: Not on file  . Stress: Not on file  Relationships  . Social connections:    Talks on phone: Not on file    Gets together: Not on file    Attends religious service: Not on file    Active member of club or organization: Not on file    Attends meetings of clubs or organizations: Not on file    Relationship status: Not on file  Other Topics Concern  .  Not on file  Social History Narrative   Originally from [place]   Came to [employer]. When 23 years old.     Lives with parents and 57 yo sister.    Additional Social History:                         Sleep: Good  Appetite:  Good  Current Medications: Current Facility-Administered Medications  Medication Dose Route Frequency Provider Last Rate Last Dose  . acetaminophen (TYLENOL) tablet 650 mg  650 mg Oral Q6H PRN Malvin Johns, MD      . alum & mag hydroxide-simeth (MAALOX/MYLANTA) 200-200-20 MG/5ML suspension 30 mL  30 mL Oral Q4H PRN Malvin Johns, MD      . benztropine (COGENTIN) tablet 0.5 mg  0.5 mg Oral BID Malvin Johns, MD   0.5 mg at 03/23/19 1683  . celecoxib (CELEBREX) capsule 200 mg  200 mg Oral BID Malvin Johns, MD      . hydrOXYzine (ATARAX/VISTARIL) tablet  50 mg  50 mg Oral TID PRN Malvin Johns, MD      . magnesium hydroxide (MILK OF MAGNESIA) suspension 30 mL  30 mL Oral Daily PRN Malvin Johns, MD      . omega-3 acid ethyl esters (LOVAZA) capsule 1 g  1 g Oral BID Malvin Johns, MD      . potassium chloride SA (K-DUR,KLOR-CON) CR tablet 20 mEq  20 mEq Oral BID Oneta Rack, NP   20 mEq at 03/23/19 7290  . prenatal multivitamin tablet 1 tablet  1 tablet Oral Q1200 Malvin Johns, MD      . risperiDONE (RISPERDAL) tablet 3 mg  3 mg Oral TID Malvin Johns, MD   3 mg at 03/23/19 2111  . temazepam (RESTORIL) capsule 30 mg  30 mg Oral QHS Malvin Johns, MD   30 mg at 03/22/19 2147  . traZODone (DESYREL) tablet 200 mg  200 mg Oral QHS PRN Malvin Johns, MD        Lab Results: No results found for this or any previous visit (from the past 48 hour(s)).  Blood Alcohol level:  Lab Results  Component Value Date   ETH <10 03/18/2019   ETH <10 04/05/2018    Metabolic Disorder Labs: Lab Results  Component Value Date   HGBA1C 5.4 06/01/2017   MPG 108 06/01/2017   Lab Results  Component Value Date   PROLACTIN 31.5 (H) 06/01/2017   PROLACTIN 34.6 (H) 03/17/2016   Lab Results  Component Value Date   CHOL 182 06/01/2017   TRIG 75 06/01/2017   HDL 36 (L) 06/01/2017   CHOLHDL 5.1 06/01/2017   VLDL 15 06/01/2017   LDLCALC 131 (H) 06/01/2017   LDLCALC 106 03/01/2016    Physical Findings: AIMS: Facial and Oral Movements Muscles of Facial Expression: None, normal Lips and Perioral Area: None, normal Jaw: None, normal Tongue: None, normal,Extremity Movements Upper (arms, wrists, hands, fingers): None, normal Lower (legs, knees, ankles, toes): None, normal, Trunk Movements Neck, shoulders, hips: None, normal, Overall Severity Severity of abnormal movements (highest score from questions above): None, normal Incapacitation due to abnormal movements: None, normal Patient's awareness of abnormal movements (rate only patient's report): No Awareness,  Dental Status Current problems with teeth and/or dentures?: No Does patient usually wear dentures?: No  CIWA:    COWS:     Musculoskeletal: Strength & Muscle Tone: within normal limits Gait & Station: normal Psychiatric Specialty Exam: Physical Exam  ROS  Blood  pressure 96/71, pulse 94, temperature (!) 97.3 F (36.3 C), temperature source Oral, resp. rate 14, height 5\' 6"  (1.676 m), weight 64 kg, SpO2 100 %.Body mass index is 22.76 kg/m.  General Appearance: Casual  Eye Contact:  Fair  Speech:  Slow  Volume:  Decreased  Mood:  Anxious and Dysphoric  Affect:  Congruent  Thought Process:  Descriptions of Associations: Tangential  Orientation:  Full (Time, Place, and Person)  Thought Content:  Logical but guarded  Suicidal Thoughts:  No  Homicidal Thoughts:  neg  Memory:  Immediate;   Good  Judgement:  Good  Insight:  Fair  Psychomotor Activity:  Decreased  Concentration:  Concentration: Fair  Recall:  Fair  Fund of Knowledge:  Fair  Language:  Fair  Akathisia:  Negative  Handed:  Right  AIMS (if indicated):     Assets:  Physical Health Resilience  ADL's:  Intact  Cognition:  WNL  Sleep:  Number of Hours: 6     Treatment Plan Summary: Daily contact with patient to assess and evaluate symptoms and progress in treatment, Medication management and Plan Continue reality-based therapy also began several augmentation strategies to speed the response to an antipsychotic as he is at the max dose generally excepted for Risperdal and has some response but not a full response to ensure safety at discharge at this point in time  [facility], MD 03/23/2019, 9:17 AM

## 2019-03-23 NOTE — Progress Notes (Signed)
Adult Psychoeducational Group Note  Date:  03/23/2019 Time:  8:52 PM  Group Topic/Focus:  Wrap-Up Group:   The focus of this group is to help patients review their daily goal of treatment and discuss progress on daily workbooks.  Participation Level:  Minimal  Participation Quality:  Appropriate  Affect:  Not Congruent  Cognitive:  Lacking  Insight: Limited  Engagement in Group:  Limited  Modes of Intervention:  Discussion and Education  Additional Comments: Patient attended and participated in group tonight. He reports that today was OK.  He went for his meals. Thinks he is in the wrong place.  Lita Mains [facility] 03/23/2019, 8:52 PM

## 2019-03-23 NOTE — Progress Notes (Signed)
Recreation Therapy Notes  INPATIENT RECREATION THERAPY ASSESSMENT  Patient Details Name: Todd [name] MRN: [id number] DOB: [birth year] Today's Date: 03/23/2019       Information Obtained From: Patient  Able to Participate in Assessment/Interview: Yes  Patient Presentation: Alert  Reason for Admission (Per Patient): Other (Comments)(Pt stated he was here for breathing problems and behavior.)  Patient Stressors: (Pt stated he wasn't really stressed.)  Coping Skills:   Aggression, Exercise, Talk, Prayer, Read  Leisure Interests (2+):  Social - Social Media  Frequency of Recreation/Participation: Weekly  Awareness of Community Resources:  Yes  Community Resources:  Library, [place], Public affairs consultant  Current Use: No  If no, Barriers?: (Pt stated he can never decide whether he should go or not.)  Expressed Interest in [employer] Information: No  [place] of Residence:  [place]  Patient Main Form of Transportation: Other (Comment)(Parent)  Patient Strengths:  Not acting a fool; Help himself/help others  Patient Identified Areas of Improvement:  Good medicine that can make him feel good and energetic  Patient Goal for Hospitalization:  "get discharge plan"  Current SI (including self-harm):  No  Current HI:  No  Current AVH: No  Staff Intervention Plan: Group Attendance, Collaborate with Interdisciplinary Treatment Team  Consent to Intern Participation: N/A    [name], LRT/CTRS  [name], Todd [name] A 03/23/2019, 2:26 PM

## 2019-03-23 NOTE — [facility] Group Notes (Signed)
[facility] LCSW Group Therapy Note  Date/Time: 03/23/19, 1315  Type of Therapy and Topic:  Group Therapy:  Overcoming Obstacles  Participation Level:  Did not attend  Description of Group:    In this group patients will be encouraged to explore what they see as obstacles to their own wellness and recovery. They will be guided to discuss their thoughts, feelings, and behaviors related to these obstacles. The group will process together ways to cope with barriers, with attention given to specific choices patients can make. Each patient will be challenged to identify changes they are motivated to make in order to overcome their obstacles. This group will be process-oriented, with patients participating in exploration of their own experiences as well as giving and receiving support and challenge from other group members.  Therapeutic Goals: 1. Patient will identify personal and current obstacles as they relate to admission. 2. Patient will identify barriers that currently interfere with their wellness or overcoming obstacles.  3. Patient will identify feelings, thought process and behaviors related to these barriers. 4. Patient will identify two changes they are willing to make to overcome these obstacles:    Summary of Patient Progress      Therapeutic Modalities:   Cognitive Behavioral Therapy Solution Focused Therapy Motivational Interviewing Relapse Prevention Therapy  Greg Desi Carby, LCSW 

## 2019-03-23 NOTE — Progress Notes (Signed)
Pt did not attend wrap up grouip.

## 2019-03-23 NOTE — Progress Notes (Signed)
Adult Psychoeducational Group Note  Date:  03/23/2019 Time:  8:49 AM  Group Topic/Focus:  Orientation:   The focus of this group is to educate the patient on the purpose and policies of crisis stabilization and provide a format to answer questions about their admission.  The group details unit policies and expectations of patients while admitted.  Participation Level:  Active  Participation Quality:  Appropriate  Affect:  Appropriate  Cognitive:  Appropriate  Insight: Appropriate  Engagement in Group:  Engaged  Modes of Intervention:  Discussion  Additional Comments:  Pt sat in group and engaged for a short while.  Pt passed when asked to talk about himself.  Pt appeared to be attentive.   Todd [name] Simrat Kendrick 03/23/2019, 8:49 AM

## 2019-03-24 MED ORDER — RISPERIDONE 3 MG PO TABS
6.0000 mg | ORAL_TABLET | Freq: Every day | ORAL | Status: DC
  Administered 2019-03-24: 21:00:00 6 mg via ORAL
  Filled 2019-03-24 (×5): qty 2

## 2019-03-24 MED ORDER — RISPERIDONE 3 MG PO TABS
3.0000 mg | ORAL_TABLET | Freq: Every day | ORAL | Status: DC
  Administered 2019-03-25 – 2019-03-27 (×3): 3 mg via ORAL
  Filled 2019-03-24 (×3): qty 1
  Filled 2019-03-24: qty 30
  Filled 2019-03-24: qty 1

## 2019-03-24 MED ORDER — ARIPIPRAZOLE ER 400 MG IM SRER
400.0000 mg | INTRAMUSCULAR | Status: DC
  Administered 2019-03-24: 400 mg via INTRAMUSCULAR
  Filled 2019-03-24: qty 2

## 2019-03-24 NOTE — Plan of Care (Signed)
  Problem: Activity: Goal: Interest or engagement in activities will improve Outcome: Not Progressing   D: Pt alert and oriented on the unit. [name] stated "I'm not hearing many voices today." Pt also refused his medications and stated that he took enough and wants to leave. Pt denied any pain. A: Education, support and encouragement provided, q15 minute safety checks remain in effect. Medications administered per MD orders. R: No reactions/side effects to medicine noted. Pt denies any concerns at this time, and verbally contracts for safety. Pt ambulating on the unit with no issues. Pt remains safe on and off the unit.

## 2019-03-24 NOTE — Progress Notes (Signed)
Pt did not attend goals/orientation group this morning.  

## 2019-03-24 NOTE — Progress Notes (Signed)
Pt up trying to make noise waking up other patients by slamming his bathroom door. Pt was informed he could have something to help him sleep, pt continued to get upset and tried to take a shower, pt was informed that it was after 11 pm and he could take a shower in the morning, due to other patients sleeping . Pt continues to be paranoid and very stand-offish especially with Clinical research associate.

## 2019-03-24 NOTE — Progress Notes (Signed)
Recreation Therapy Notes  Date: 4.7.20 Time: 0950 Location: [address] Dayroom  Group Topic: Coping Skills  Goal Area(s) Addresses:  Patient will identify positive coping skills. Patient will identify the importance of coping skills. Patient will identify benefit of using coping skills post d/c.  Intervention: Worksheet, Music  Activity:  Coping A to Z.  Patients were to identify a coping skill for each letter of the alphabet.  Patients would then share with the group their top 6 six coping skills.  Education: Coping Skills, Discharge Planning.   Education Outcome: Acknowledges understanding/In group clarification offered/Needs additional education.   Clinical Observations/Feedback:  Pt did not attend group.    Aliahna Statzer, LRT/CTRS         Jone Panebianco A 03/24/2019 11:06 AM 

## 2019-03-24 NOTE — Progress Notes (Signed)
[facility]  03/24/2019 10:05 AM Todd FisherJuan Francisco Pasty ArchHernandez [name]  MRN:  [id number] Subjective:    remains guarded intermittently intrusive and anxious continues to be psychotic yet resisting meds and refusing some doses vertically when he saw another patient refuse meds. His test dose of aripiprazole fine We will go ahead and give Abilify long-acting injectable if he agrees have discussed it previously Psychotic still without insight continue cognitive therapy and reality based therapy no EPS or TD Principal Problem: Severe manic bipolar 1 disorder with psychotic behavior (HCC) Diagnosis: Principal Problem:   Severe manic bipolar 1 disorder with psychotic behavior (HCC)  Total Time spent with patient: 20 minutes Past Medical History:  Past Medical History:  Diagnosis Date  . Bipolar 1 disorder (HCC)    History reviewed. No pertinent surgical history. Family History:  Family History  Problem Relation Age of Onset  . Hyperlipidemia Father   . Diabetes Maternal Grandmother   . Alcohol abuse Paternal Grandfather    Social History:  Social History   Substance and Sexual Activity  Alcohol Use Yes  . Alcohol/week: 0.0 standard drinks   Comment: Beer and Tequila on weekends.  3 beers and 4 shots of Tequila over 2 weekends per month     Social History   Substance and Sexual Activity  Drug Use Yes  . Types: Marijuana   Comment: Marijuana:  starts and stops.  When smoking, daily.  Tested every 2 weeks due to taking prescription drug abuse--[place] in [place].  Goes throught 05/2016. Has used MJ since 6th grade.  Went to [employer]--smoked a lot daily.    Social History   Socioeconomic History  . Marital status: Single    Spouse name: Not on file  . Number of children: 0  . Years of education: Not on file  . Highest education level: Not on file  Occupational History  . 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 Financial resource strain: Not on  file  . Food insecurity:    Worry: Not on file    Inability: Not on file  . Transportation needs:    Medical: Not on file    Non-medical: Not on file  Tobacco Use  . Smoking status: Former Smoker    Packs/day: 0.00  . Smokeless tobacco: Never Used  Substance and Sexual Activity  . Alcohol use: Yes    Alcohol/week: 0.0 standard drinks    Comment: Beer and Tequila on weekends.  3 beers and 4 shots of Tequila over 2 weekends per month  . Drug use: Yes    Types: Marijuana    Comment: Marijuana:  starts and stops.  When smoking, daily.  Tested every 2 weeks due to taking prescription drug abuse--[place] in [place].  Goes throught 05/2016. Has used MJ since 6th grade.  Went to [employer]--smoked a lot daily.  Marland Kitchen. Sexual activity: Yes    Partners: Female    Birth control/protection: Condom  Lifestyle  . Physical activity:    Days per week: Not on file    Minutes per session: Not on file  . Stress: Not on file  Relationships  . Social connections:    Talks on phone: Not on file    Gets together: Not on file    Attends religious service: Not on file    Active member of club or organization: Not on file    Attends meetings of clubs or organizations: Not on file    Relationship status:  Not on file  Other Topics Concern  . Not on file  Social History Narrative   Originally from [place]   Came to [employer]. When 23 years old.     Lives with parents and 10 yo sister.    Additional Social History:                         Sleep: Good  Appetite:  Good  Current Medications: Current Facility-Administered Medications  Medication Dose Route Frequency Provider Last Rate Last Dose  . acetaminophen (TYLENOL) tablet 650 mg  650 mg Oral Q6H PRN Malvin Johns, MD      . alum & mag hydroxide-simeth (MAALOX/MYLANTA) 200-200-20 MG/5ML suspension 30 mL  30 mL Oral Q4H PRN Malvin Johns, MD      . ARIPiprazole ER (ABILIFY MAINTENA) injection 400 mg  400 mg Intramuscular Q28 days Malvin Johns, MD      . benztropine (COGENTIN) tablet 0.5 mg  0.5 mg Oral BID Malvin Johns, MD   0.5 mg at 03/23/19 8295  . celecoxib (CELEBREX) capsule 200 mg  200 mg Oral BID Malvin Johns, MD   200 mg at 03/23/19 1207  . hydrOXYzine (ATARAX/VISTARIL) tablet 50 mg  50 mg Oral TID PRN Malvin Johns, MD      . magnesium hydroxide (MILK OF MAGNESIA) suspension 30 mL  30 mL Oral Daily PRN Malvin Johns, MD      . omega-3 acid ethyl esters (LOVAZA) capsule 1 g  1 g Oral BID Malvin Johns, MD   1 g at 03/23/19 1208  . prenatal multivitamin tablet 1 tablet  1 tablet Oral Q1200 Malvin Johns, MD   1 tablet at 03/23/19 1207  . [START ON 03/25/2019] risperiDONE (RISPERDAL) tablet 3 mg  3 mg Oral Daily Malvin Johns, MD      . risperiDONE (RISPERDAL) tablet 6 mg  6 mg Oral QHS Malvin Johns, MD      . temazepam (RESTORIL) capsule 30 mg  30 mg Oral QHS Malvin Johns, MD   30 mg at 03/22/19 2147  . traZODone (DESYREL) tablet 200 mg  200 mg Oral QHS PRN Malvin Johns, MD        Lab Results: No results found for this or any previous visit (from the past 48 hour(s)).  Blood Alcohol level:  Lab Results  Component Value Date   ETH <10 03/18/2019   ETH <10 04/05/2018    Metabolic Disorder Labs: Lab Results  Component Value Date   HGBA1C 5.4 06/01/2017   MPG 108 06/01/2017   Lab Results  Component Value Date   PROLACTIN 31.5 (H) 06/01/2017   PROLACTIN 34.6 (H) 03/17/2016   Lab Results  Component Value Date   CHOL 182 06/01/2017   TRIG 75 06/01/2017   HDL 36 (L) 06/01/2017   CHOLHDL 5.1 06/01/2017   VLDL 15 06/01/2017   LDLCALC 131 (H) 06/01/2017   LDLCALC 106 03/01/2016    Physical Findings: AIMS: Facial and Oral Movements Muscles of Facial Expression: None, normal Lips and Perioral Area: None, normal Jaw: None, normal Tongue: None, normal,Extremity Movements Upper (arms, wrists, hands, fingers): None, normal Lower (legs, knees, ankles, toes): None, normal, Trunk Movements Neck, shoulders, hips:  None, normal, Overall Severity Severity of abnormal movements (highest score from questions above): None, normal Incapacitation due to abnormal movements: None, normal Patient's awareness of abnormal movements (rate only patient's report): No Awareness, Dental Status Current problems with teeth and/or dentures?: No Does patient usually wear  dentures?: No  CIWA:    COWS:     Musculoskeletal: Strength & Muscle Tone: within normal limits Gait & Station: normal Patient leans: N/A  Psychiatric Specialty Exam: Physical Exam  ROS  Blood pressure 96/71, pulse 94, temperature (!) 97.3 F (36.3 C), temperature source Oral, resp. rate 14, height 5\' 6"  (1.676 m), weight 64 kg, SpO2 100 %.Body mass index is 22.76 kg/m.  General Appearance: Casual  Eye Contact:  Minimal  Speech:  Garbled  Volume:  Decreased  Mood:  Anxious and Dysphoric  Affect:  Blunt  Thought Process:  Irrelevant  Orientation:  Full (Time, Place, and Person)  Thought Content:  Delusions and Tangential  Suicidal Thoughts:  No  Homicidal Thoughts:  No  Memory:  Immediate;   Fair  Judgement:  Impaired  Insight:  Lacking  Psychomotor Activity:  Psychomotor Retardation  Concentration:  Concentration: Poor  Recall:  Poor  Fund of Knowledge:  Poor  Language:  Good  Akathisia:  Negative  Handed:  Right  AIMS (if indicated):     Assets:  Leisure Time Physical Health  ADL's:  Intact  Cognition:  WNL  Sleep:  Number of Hours: 5     Treatment Plan Summary: Daily contact with patient to assess and evaluate symptoms and progress in treatment, Medication management and Plan To new current precautions add long-acting injectable continue reality-based therapy  Valetta Mulroy, MD 03/24/2019, 10:05 AM

## 2019-03-24 NOTE — Progress Notes (Signed)
D: Pt denies SI/HI/AVH. Pt appears paranoid and is a little stand-offish with Clinical research associate. Pt appears to be responding to internal stimuli but does not admit to it. Pt has delayed responses .  Pt keeps to himself, pt has minimal interaction on the milieu this evening.   A: Pt was offered support and encouragement.  Pt was encourage to attend groups. Q 15 minute checks were done for safety.   R:safety maintained on unit.  Problem: Activity: Goal: Interest or engagement in activities will improve Outcome: Not Progressing   Problem: Activity: Goal: Sleeping patterns will improve Outcome: Progressing   Problem: Coping: Goal: Ability to demonstrate self-control will improve Outcome: Progressing

## 2019-03-24 NOTE — [facility] Group Notes (Signed)
[facility] LCSW Group Therapy Note  Date/Time: 03/24/19, 1100  Type of Therapy/Topic:  Group Therapy:  Feelings about Diagnosis  Participation Level:  Did Not Attend   Mood:   Description of Group:    This group will allow patients to explore their thoughts and feelings about diagnoses they have received. Patients will be guided to explore their level of understanding and acceptance of these diagnoses. Facilitator will encourage patients to process their thoughts and feelings about the reactions of others to their diagnosis, and will guide patients in identifying ways to discuss their diagnosis with significant others in their lives. This group will be process-oriented, with patients participating in exploration of their own experiences as well as giving and receiving support and challenge from other group members.   Therapeutic Goals: 1. Patient will demonstrate understanding of diagnosis as evidence by identifying two or more symptoms of the disorder:  2. Patient will be able to express two feelings regarding the diagnosis 3. Patient will demonstrate ability to communicate their needs through discussion and/or role plays  Summary of Patient Progress:        Therapeutic Modalities:   Cognitive Behavioral Therapy Brief Therapy Feelings Identification   Daleen Squibb, LCSW

## 2019-03-24 NOTE — Plan of Care (Signed)
  Problem: Activity: Goal: Interest or engagement in activities will improve 03/24/2019 1407 by Tania Ade, RN Outcome: Not Progressing D: Pt alert and oriented on the unit. Pt engaging with RN staff and other pts. Pt denies SI/HI, VH, but endorses AH. [name] affect was flat and he isolated standing in his room most of the day. Pt denied any pain and did not attend unit groups.  A: Education, support and encouragement provided, q15 minute safety checks remain in effect. Medications administered per MD orders. R: No reactions/side effects to medicine noted. Pt denies any concerns at this time, and verbally contracts for safety. Pt ambulating on the unit with no issues. Pt remains safe on and off the unit.

## 2019-03-24 NOTE — Progress Notes (Signed)
Adult Psychoeducational Group Note  Date:  03/24/2019 Time:  8:53 PM  Group Topic/Focus:  Wrap-Up Group:   The focus of this group is to help patients review their daily goal of treatment and discuss progress on daily workbooks.  Participation Level:  Active  Participation Quality:  Appropriate  Affect:  Appropriate  Cognitive:  Appropriate  Insight: Appropriate  Engagement in Group:  Engaged  Modes of Intervention:  Discussion   Additional Comments:  Patient attended group and said that his day was a 10.  Patient said he discussed his discharged plan with the physician and he will be discharged within the next two days. His coping skills for today were going outside, attending groups and sleeping.   Juda Lajeunesse W Lynnell Fiumara 03/24/2019, 8:53 PM

## 2019-03-25 NOTE — Tx Team (Signed)
Interdisciplinary Treatment and Diagnostic Plan Update  03/25/2019 Time of Session: Todd [name]  Principal Diagnosis: Severe manic bipolar 1 disorder with psychotic behavior [facility])  Secondary Diagnoses: Principal Problem:   Severe manic bipolar 1 disorder with psychotic behavior ([name])   Current Medications:  Current Facility-Administered Medications  Medication Dose Route Frequency Provider Last Rate Last Dose  . acetaminophen (TYLENOL) tablet 650 mg  650 mg Oral Q6H PRN Todd Hai, MD      . alum & mag hydroxide-simeth (MAALOX/MYLANTA) 200-200-20 MG/5ML suspension 30 mL  30 mL Oral Q4H PRN Todd Hai, MD      . ARIPiprazole ER (ABILIFY MAINTENA) injection 400 mg  400 mg Intramuscular Q28 days Todd Hai, MD   400 mg at 03/24/19 1401  . benztropine (COGENTIN) tablet 0.5 mg  0.5 mg Oral BID Todd Hai, MD   0.5 mg at 03/25/19 0741  . celecoxib (CELEBREX) capsule 200 mg  200 mg Oral BID Todd Hai, MD   200 mg at 03/25/19 0741  . hydrOXYzine (ATARAX/VISTARIL) tablet 50 mg  50 mg Oral TID PRN Todd Hai, MD      . magnesium hydroxide (MILK OF MAGNESIA) suspension 30 mL  30 mL Oral Daily PRN Todd Hai, MD      . omega-3 acid ethyl esters (LOVAZA) capsule 1 g  1 g Oral BID Todd Hai, MD   1 g at 03/25/19 0741  . prenatal multivitamin tablet 1 tablet  1 tablet Oral Q1200 Todd Hai, MD   1 tablet at 03/23/19 1207  . risperiDONE (RISPERDAL) tablet 3 mg  3 mg Oral Daily Todd Hai, MD   3 mg at 03/25/19 0741  . risperiDONE (RISPERDAL) tablet 6 mg  6 mg Oral QHS Todd Hai, MD   6 mg at 03/24/19 2110  . temazepam (RESTORIL) capsule 30 mg  30 mg Oral QHS Todd Hai, MD   30 mg at 03/24/19 2110  . traZODone (DESYREL) tablet 200 mg  200 mg Oral QHS PRN Todd Hai, MD       PTA Medications: Medications Prior to Admission  Medication Sig Dispense Refill Last Dose  . carbamazepine (TEGRETOL) 200 MG tablet Take 1 tablet (200 mg total)  by mouth 2 (two) times daily after a meal. (Patient not taking: Reported on 03/18/2019) 14 tablet 0 Not Taking at Unknown time  . FLUoxetine (PROZAC) 10 MG tablet 1 tab by mouth in the morning (Patient not taking: Reported on 03/18/2019) 30 tablet 3 Not Taking at Unknown time  . hydrocortisone cream 0.5 % Apply 1 application topically 2 (two) times daily. (Patient not taking: Reported on 03/18/2019) 30 g 0 Not Taking at Unknown time  . OLANZapine (ZYPREXA) 5 MG tablet Take 1 tablet (5 mg total) by mouth at bedtime. (Patient not taking: Reported on 03/18/2019) 30 tablet 3 Not Taking at Unknown time  . risperiDONE (RISPERDAL) 0.5 MG tablet Take 1 tablet (0.5 mg total) by mouth 2 (two) times daily. (Patient not taking: Reported on 03/18/2019) 14 tablet 0 Not Taking at Unknown time    Patient Stressors: Health problems Medication change or noncompliance  Patient Strengths: Physical Health Supportive family/friends Work skills  Treatment Modalities: Medication Management, Group therapy, Case management,  1 to 1 session with clinician, Psychoeducation, Recreational therapy.   Physician Treatment Plan for Primary Diagnosis: Severe manic bipolar 1 disorder with psychotic behavior ([name]) Long Term Goal(s): Improvement in symptoms so as ready for discharge Improvement in symptoms so as ready for  discharge   Short Term Goals: Ability to identify and develop effective coping behaviors will improve Compliance with prescribed medications will improve  Medication Management: Evaluate patient's response, side effects, and tolerance of medication regimen.  Therapeutic Interventions: 1 to 1 sessions, Unit Group sessions and Medication administration.  Evaluation of Outcomes: Not Met  Physician Treatment Plan for Secondary Diagnosis: Principal Problem:   Severe manic bipolar 1 disorder with psychotic behavior ([name])  Long Term Goal(s): Improvement in symptoms so as ready for discharge Improvement in symptoms so  as ready for discharge   Short Term Goals: Ability to identify and develop effective coping behaviors will improve Compliance with prescribed medications will improve     Medication Management: Evaluate patient's response, side effects, and tolerance of medication regimen.  Therapeutic Interventions: 1 to 1 sessions, Unit Group sessions and Medication administration.  Evaluation of Outcomes: Not Met   RN Treatment Plan for Primary Diagnosis: Severe manic bipolar 1 disorder with psychotic behavior ([name]) Long Term Goal(s): Knowledge of disease and therapeutic regimen to maintain health will improve  Short Term Goals: Ability to identify and develop effective coping behaviors will improve and Compliance with prescribed medications will improve  Medication Management: RN will administer medications as ordered by provider, will assess and evaluate patient's response and provide education to patient for prescribed medication. RN will report any adverse and/or side effects to prescribing provider.  Therapeutic Interventions: 1 on 1 counseling sessions, Psychoeducation, Medication administration, Evaluate responses to treatment, Monitor vital signs and CBGs as ordered, Perform/monitor CIWA, COWS, AIMS and Fall Risk screenings as ordered, Perform wound care treatments as ordered.  Evaluation of Outcomes: Not Met   LCSW Treatment Plan for Primary Diagnosis: Severe manic bipolar 1 disorder with psychotic behavior ([place]) Long Term Goal(s): Safe transition to appropriate next level of care at discharge, Engage patient in therapeutic group addressing interpersonal concerns.  Short Term Goals: Engage patient in aftercare planning with referrals and resources, Increase social support and Increase skills for wellness and recovery  Therapeutic Interventions: Assess for all discharge needs, 1 to 1 time with Social worker, Explore available resources and support systems, Assess for adequacy in community  support network, Educate family and significant other(s) on suicide prevention, Complete Psychosocial Assessment, Interpersonal group therapy.  Evaluation of Outcomes: Not Met   Progress in Treatment: Attending groups: No. Participating in groups: No. Taking medication as prescribed: Yes. Toleration medication: Yes. Family/Significant other contact made: Yes, individual(s) contacted:  mother Patient understands diagnosis: No. Discussing patient identified problems/goals with staff: No. Medical problems stabilized or resolved: Yes. Denies suicidal/homicidal ideation: Yes. Issues/concerns per patient self-inventory: No. Other: none  New problem(s) identified: No, Describe:  none  New Short Term/Long Term Goal(s):  Patient Goals:  "get things done"  Discharge Plan or Barriers:   Reason for Continuation of Hospitalization: Delusions  Hallucinations Medication stabilization  Estimated Length of Stay: 2-4 days.    Attendees: Patient: 03/25/2019   Physician: Dr. Jake Samples, MD 03/25/2019   Nursing: Boyce Medici, RN 03/25/2019   RN Care Manager: 03/25/2019   Social Worker: Lurline Idol, LCSW 03/25/2019   Recreational Therapist:  03/25/2019   Other:  03/25/2019   Other:  03/25/2019   Other: 03/25/2019              Scribe for Treatment Team: Joanne Chars, LCSW 03/25/2019 9:59 AM

## 2019-03-25 NOTE — Progress Notes (Signed)
Pt did not attend goals/orientation group this morning.  

## 2019-03-25 NOTE — Plan of Care (Signed)
  Problem: Coping: Goal: Ability to verbalize frustrations and anger appropriately will improve Outcome: Progressing   D: Pt alert and oriented on the unit. Pt engaging with RN staff appropriately on approach. Pt stated, I'm ready to be discharged." Pt denies SI/HI, A/VH, and did not attend groups. Pt is cooperative. A: Education, support and encouragement provided, q15 minute safety checks remain in effect. Medications administered per MD orders. R: No reactions/side effects to medicine noted. Pt denies any concerns at this time, and verbally contracts for safety. Pt ambulating on the unit with no issues. Pt remains safe on and off the unit.

## 2019-03-25 NOTE — Progress Notes (Signed)
[facility]  03/25/2019 9:33 AM Todd FisherJuan Francisco Pasty ArchHernandez [name]  MRN:  [id number] Subjective:    Patient remains guarded intermittently intrusive and minimizing symptoms and intermittently compliant with meds.  Clearly dangerous if untreated and long-acting injectable has been ordered.  No EPS or TD.  Still lacking insight focused only on discharge Denying hallucinations  Principal Problem: Severe manic bipolar 1 disorder with psychotic behavior (HCC) Diagnosis: Principal Problem:   Severe manic bipolar 1 disorder with psychotic behavior (HCC)  Total Time spent with patient: 20 minutes  Past Medical History:  Past Medical History:  Diagnosis Date  . Bipolar 1 disorder (HCC)    History reviewed. No pertinent surgical history. Family History:  Family History  Problem Relation Age of Onset  . Hyperlipidemia Father   . Diabetes Maternal Grandmother   . Alcohol abuse Paternal Grandfather     Social History:  Social History   Substance and Sexual Activity  Alcohol Use Yes  . Alcohol/week: 0.0 standard drinks   Comment: Beer and Tequila on weekends.  3 beers and 4 shots of Tequila over 2 weekends per month     Social History   Substance and Sexual Activity  Drug Use Yes  . Types: Marijuana   Comment: Marijuana:  starts and stops.  When smoking, daily.  Tested every 2 weeks due to taking prescription drug abuse--stopped in [place].  Goes throught 05/2016. Has used MJ since 6th grade.  Went to [employer]--smoked a lot daily.    Social History   Socioeconomic History  . Marital status: Single    Spouse name: Not on file  . Number of children: 0  . Years of education: Not on file  . Highest education level: Not on file  Occupational History  . 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 Financial resource strain: Not on file  . Food insecurity:    Worry: Not on file    Inability: Not on file  . Transportation needs:    Medical: Not on file    Non-medical: Not on file  Tobacco Use  . Smoking status: Former Smoker    Packs/day: 0.00  . Smokeless tobacco: Never Used  Substance and Sexual Activity  . Alcohol use: Yes    Alcohol/week: 0.0 standard drinks    Comment: Beer and Tequila on weekends.  3 beers and 4 shots of Tequila over 2 weekends per month  . Drug use: Yes    Types: Marijuana    Comment: Marijuana:  starts and stops.  When smoking, daily.  Tested every 2 weeks due to taking prescription drug abuse--stopped in [place] .  Goes throught 05/2016. Has used MJ since 6th grade.  Went to [employer]--smoked a lot daily.  Marland Kitchen. Sexual activity: Yes    Partners: Female    Birth control/protection: Condom  Lifestyle  . Physical activity:    Days per week: Not on file    Minutes per session: Not on file  . Stress: Not on file  Relationships  . Social connections:    Talks on phone: Not on file    Gets together: Not on file    Attends religious service: Not on file    Active member of club or organization: Not on file    Attends meetings of clubs or organizations: Not on file    Relationship status: Not on file  Other Topics Concern  . Not on file  Social History Narrative   Originally from [place]  [place]ame to [place] 23 years old.     Lives with parents and 62 yo sister.    Additional Social History:                         Sleep: Good  Appetite:  Good  Current Medications: Current Facility-Administered Medications  Medication Dose Route Frequency Provider Last Rate Last Dose  . acetaminophen (TYLENOL) tablet 650 mg  650 mg Oral Q6H PRN Malvin Johns, MD      . alum & mag hydroxide-simeth (MAALOX/MYLANTA) 200-200-20 MG/5ML suspension 30 mL  30 mL Oral Q4H PRN Malvin Johns, MD      . ARIPiprazole ER (ABILIFY MAINTENA) injection 400 mg  400 mg Intramuscular Q28 days Malvin Johns, MD   400 mg at 03/24/19 1401  . benztropine (COGENTIN) tablet 0.5 mg  0.5 mg Oral BID Malvin Johns, MD   0.5 mg at 03/25/19  0741  . celecoxib (CELEBREX) capsule 200 mg  200 mg Oral BID Malvin Johns, MD   200 mg at 03/25/19 0741  . hydrOXYzine (ATARAX/VISTARIL) tablet 50 mg  50 mg Oral TID PRN Malvin Johns, MD      . magnesium hydroxide (MILK OF MAGNESIA) suspension 30 mL  30 mL Oral Daily PRN Malvin Johns, MD      . omega-3 acid ethyl esters (LOVAZA) capsule 1 g  1 g Oral BID Malvin Johns, MD   1 g at 03/25/19 0741  . prenatal multivitamin tablet 1 tablet  1 tablet Oral Q1200 Malvin Johns, MD   1 tablet at 03/23/19 1207  . risperiDONE (RISPERDAL) tablet 3 mg  3 mg Oral Daily Malvin Johns, MD   3 mg at 03/25/19 0741  . risperiDONE (RISPERDAL) tablet 6 mg  6 mg Oral QHS Malvin Johns, MD   6 mg at 03/24/19 2110  . temazepam (RESTORIL) capsule 30 mg  30 mg Oral QHS Malvin Johns, MD   30 mg at 03/24/19 2110  . traZODone (DESYREL) tablet 200 mg  200 mg Oral QHS PRN Malvin Johns, MD        Lab Results: No results found for this or any previous visit (from the past 48 hour(s)).  Blood Alcohol level:  Lab Results  Component Value Date   ETH <10 03/18/2019   ETH <10 04/05/2018    Metabolic Disorder Labs: Lab Results  Component Value Date   HGBA1C 5.4 06/01/2017   MPG 108 06/01/2017   Lab Results  Component Value Date   PROLACTIN 31.5 (H) 06/01/2017   PROLACTIN 34.6 (H) 03/17/2016   Lab Results  Component Value Date   CHOL 182 06/01/2017   TRIG 75 06/01/2017   HDL 36 (L) 06/01/2017   CHOLHDL 5.1 06/01/2017   VLDL 15 06/01/2017   LDLCALC 131 (H) 06/01/2017   LDLCALC 106 03/01/2016    Physical Findings: AIMS: Facial and Oral Movements Muscles of Facial Expression: None, normal Lips and Perioral Area: None, normal Jaw: None, normal Tongue: None, normal,Extremity Movements Upper (arms, wrists, hands, fingers): None, normal Lower (legs, knees, ankles, toes): None, normal, Trunk Movements Neck, shoulders, hips: None, normal, Overall Severity Severity of abnormal movements (highest score from questions  above): None, normal Incapacitation due to abnormal movements: None, normal Patient's awareness of abnormal movements (rate only patient's report): No Awareness, Dental Status Current problems with teeth and/or dentures?: No Does patient usually wear dentures?: No  CIWA:    COWS:     Musculoskeletal: Strength & Muscle Tone:  within normal limits Gait & Station: normal Patient leans: N/A  Psychiatric Specialty Exam: Physical Exam  ROS  Blood pressure 96/71, pulse 94, temperature (!) 97.3 F (36.3 C), temperature source Oral, resp. rate 14, height 5\' 6"  (1.676 m), weight 64 kg, SpO2 100 %.Body mass index is 22.76 kg/m.  General Appearance: Casual  Eye Contact:  Minimal  Speech:  Garbled  Volume:  Normal  Mood:  Anxious and Dysphoric  Affect:  Congruent and Constricted  Thought Process:  Irrelevant  Orientation:  Full (Time, Place, and Person)  Thought Content:  Paranoid Ideation  Suicidal Thoughts:  No  Homicidal Thoughts:  No  Memory:  Immediate;   Fair  Judgement:  Impaired  Insight:  Lacking  Psychomotor Activity:  Increased  Concentration:  Concentration: Fair  Recall:  [employer] of Knowledge:  Fair  Language:  Fair  Akathisia:  Negative  Handed:  Right  AIMS (if indicated):     Assets:  Physical Health Resilience  ADL's:  Intact  Cognition:  WNL  Sleep:  Number of Hours: 5.75     Treatment Plan Summary: Daily contact with patient to assess and evaluate symptoms and progress in treatment, Medication management and Plan Continue current meds to include long-acting orders continue cognitive and rehab based therapies  Malvin Johns, MD 03/25/2019, 9:33 AM

## 2019-03-25 NOTE — Progress Notes (Signed)
Recreation Therapy Notes  Date: 4.8.20 Time: 1000 Location: 500 Hall Dayroom  Group Topic: Anxiety  Goal Area(s) Addresses:  Patient will identify triggers for anxiety. Patient will identify physical symptoms they have when anxious. Patient will identify coping skills for anxiety.  Intervention:  Worksheet  Activity: Intro to Anxiety.  Patients were to identify the things that trigger their anxiety, physical symptoms they have when anxious, thoughts they have when anxious and ways they cope with anxiety.  Education: Communication, Discharge Planning  Education Outcome: Acknowledges understanding/In group clarification offered/Needs additional education.   Clinical Observations/Feedback: Pt did not attend group.    [name], LRT/CTRS        [name] A 03/25/2019 11:16 AM

## 2019-03-25 NOTE — [facility] Group Notes (Signed)
  [facility] LCSW Group Therapy Note  Date/Time: 03/25/19, 1315  Type of Therapy/Topic:  Group Therapy:  Emotion Regulation  Participation Level:  Did Not Attend   Mood:  Description of Group:    The purpose of this group is to assist patients in learning to regulate negative emotions and experience positive emotions. Patients will be guided to discuss ways in which they have been vulnerable to their negative emotions. These vulnerabilities will be juxtaposed with experiences of positive emotions or situations, and patients challenged to use positive emotions to combat negative ones. Special emphasis will be placed on coping with negative emotions in conflict situations, and patients will process healthy conflict resolution skills.  Therapeutic Goals: 1. Patient will identify two positive emotions or experiences to reflect on in order to balance out negative emotions:  2. Patient will label two or more emotions that they find the most difficult to experience:  3. Patient will be able to demonstrate positive conflict resolution skills through discussion or role plays:   Summary of Patient Progress:       Therapeutic Modalities:   Cognitive Behavioral Therapy Feelings Identification Dialectical Behavioral Therapy  Daleen Squibb, LCSW

## 2019-03-26 NOTE — Progress Notes (Signed)
Patient ID: Todd [name], male   DOB: November 29, 1996, 23 y.o.   MRN: [id number]   PT did not attend afternoon group with [facility].

## 2019-03-26 NOTE — Progress Notes (Signed)
[facility] MD Progress Note  03/26/2019 9:34 AM Todd [name]  MRN:  [id number] Subjective:    Patient continues to minimize symptoms but is actually showing some improvement did receive long-acting injectable and has been compliant for couple of days probable discharge tomorrow we do not think there is dangerousness at the present time but he is still not completely baseline.  Denies all symptoms when questioned no EPS or TD  Principal Problem: Severe manic bipolar 1 disorder with psychotic behavior (HCC) Diagnosis: Principal Problem:   Severe manic bipolar 1 disorder with psychotic behavior (HCC)  Total Time spent with patient: 20 minutes  Past Medical History:  Past Medical History:  Diagnosis Date  . Bipolar 1 disorder (HCC)    History reviewed. No pertinent surgical history. Family History:  Family History  Problem Relation Age of Onset  . Hyperlipidemia Father   . Diabetes Maternal Grandmother   . Alcohol abuse Paternal Grandfather     Social History:  Social History   Substance and Sexual Activity  Alcohol Use Yes  . Alcohol/week: 0.0 standard drinks   Comment: Beer and Tequila on weekends.  3 beers and 4 shots of Tequila over 2 weekends per month     Social History   Substance and Sexual Activity  Drug Use Yes  . Types: Marijuana   Comment: Marijuana:  starts and stops.  When smoking, daily.  Tested every 2 weeks due to taking prescription drug abuse--stopped in [place].  Goes throught 05/2016. Has used MJ since 6th grade.  Went to [employer] a lot daily.    Social History   Socioeconomic History  . Marital status: Single    Spouse name: Not on file  . Number of children: 0  . Years of education: Not on file  . Highest education level: Not on file  Occupational History  . 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 Financial resource strain: Not on file  . Food insecurity:    Worry: Not on file    Inability: Not on  file  . Transportation needs:    Medical: Not on file    Non-medical: Not on file  Tobacco Use  . Smoking status: Former Smoker    Packs/day: 0.00  . Smokeless tobacco: Never Used  Substance and Sexual Activity  . Alcohol use: Yes    Alcohol/week: 0.0 standard drinks    Comment: Beer and Tequila on weekends.  3 beers and 4 shots of Tequila over 2 weekends per month  . Drug use: Yes    Types: Marijuana    Comment: Marijuana:  starts and stops.  When smoking, daily.  Tested every 2 weeks due to taking prescription drug abuse--stopped in [place].  Goes throught 05/2016. Has used MJ since 6th grade.  Went to [employer] a lot daily.  Marland Kitchen Sexual activity: Yes    Partners: Female    Birth control/protection: Condom  Lifestyle  . Physical activity:    Days per week: Not on file    Minutes per session: Not on file  . Stress: Not on file  Relationships  . Social connections:    Talks on phone: Not on file    Gets together: Not on file    Attends religious service: Not on file    Active member of club or organization: Not on file    Attends meetings of clubs or organizations: Not on file    Relationship status: Not on file  Other  Topics Concern  . Not on file  Social History Narrative   [place]   [place] 23 [place]     Lives with parents and 23 yo sister.    Additional Social History:                         Sleep: Good  Appetite:  Good  Current Medications: Current Facility-Administered Medications  Medication Dose Route Frequency Provider Last Rate Last Dose  . acetaminophen (TYLENOL) tablet 650 mg  650 mg Oral Q6H PRN Malvin JohnsFarah, Jessee Newnam, MD      . alum & mag hydroxide-simeth (MAALOX/MYLANTA) 200-200-20 MG/5ML suspension 30 mL  30 mL Oral Q4H PRN Malvin JohnsFarah, Sahmya Arai, MD      . ARIPiprazole ER (ABILIFY MAINTENA) injection 400 mg  400 mg Intramuscular Q28 days Malvin JohnsFarah, Jasiri Hanawalt, MD   400 mg at 03/24/19 1401  . benztropine (COGENTIN) tablet 0.5 mg   0.5 mg Oral BID Malvin JohnsFarah, Rayce Brahmbhatt, MD   0.5 mg at 03/26/19 0749  . celecoxib (CELEBREX) capsule 200 mg  200 mg Oral BID Malvin JohnsFarah, Shauna Bodkins, MD   200 mg at 03/26/19 [id number]  . hydrOXYzine (ATARAX/VISTARIL) tablet 50 mg  50 mg Oral TID PRN Malvin JohnsFarah, Lex Linhares, MD      . magnesium hydroxide (MILK OF MAGNESIA) suspension 30 mL  30 mL Oral Daily PRN Malvin JohnsFarah, Alexande Sheerin, MD      . omega-3 acid ethyl esters (LOVAZA) capsule 1 g  1 g Oral BID Malvin JohnsFarah, Makendra Vigeant, MD   1 g at 03/26/19 0749  . prenatal multivitamin tablet 1 tablet  1 tablet Oral Q1200 Malvin JohnsFarah, Drezden Seitzinger, MD   1 tablet at 03/25/19 1713  . risperiDONE (RISPERDAL) tablet 3 mg  3 mg Oral Daily Malvin JohnsFarah, Gerry Heaphy, MD   3 mg at 03/26/19 0749  . risperiDONE (RISPERDAL) tablet 6 mg  6 mg Oral QHS Malvin JohnsFarah, Zayanna Pundt, MD   6 mg at 03/24/19 2110  . temazepam (RESTORIL) capsule 30 mg  30 mg Oral QHS Malvin JohnsFarah, Chi Garlow, MD   30 mg at 03/24/19 2110  . traZODone (DESYREL) tablet 200 mg  200 mg Oral QHS PRN Malvin JohnsFarah, Yolandra Habig, MD        Lab Results: No results found for this or any previous visit (from the past 48 hour(s)).  Blood Alcohol level:  Lab Results  Component Value Date   ETH <10 03/18/2019   ETH <10 04/05/2018    Metabolic Disorder Labs: Lab Results  Component Value Date   HGBA1C 5.4 06/01/2017   MPG 108 06/01/2017   Lab Results  Component Value Date   PROLACTIN 31.5 (H) 06/01/2017   PROLACTIN 34.6 (H) 03/17/2016   Lab Results  Component Value Date   CHOL 182 06/01/2017   TRIG 75 06/01/2017   HDL 36 (L) 06/01/2017   CHOLHDL 5.1 06/01/2017   VLDL 15 06/01/2017   LDLCALC 131 (H) 06/01/2017   LDLCALC 106 03/01/2016    Physical Findings: AIMS: Facial and Oral Movements Muscles of Facial Expression: None, normal Lips and Perioral Area: None, normal Jaw: None, normal Tongue: None, normal,Extremity Movements Upper (arms, wrists, hands, fingers): None, normal Lower (legs, knees, ankles, toes): None, normal, Trunk Movements Neck, shoulders, hips: None, normal, Overall  Severity Severity of abnormal movements (highest score from questions above): None, normal Incapacitation due to abnormal movements: None, normal Patient's awareness of abnormal movements (rate only patient's report): No Awareness, Dental Status Current problems with teeth and/or dentures?: No Does patient usually  wear dentures?: No  CIWA:    COWS:     Musculoskeletal: Strength & Muscle Tone: within normal limits Gait & Station: normal Patient leans: N/A  Psychiatric Specialty Exam: Physical Exam  ROS  Blood pressure 96/71, pulse 94, temperature (!) 97.3 F (36.3 C), temperature source Oral, resp. rate 14, height  (1.676 m), weight 64 kg, SpO2 100 %.Body mass index is 22.76 kg/m.  General Appearance: Casual  Eye Contact:  Good  Speech:  Clear and Coherent  Volume:  Normal  Mood:  Dysphoric  Affect:  Appropriate  Thought Process:  Coherent  Orientation:  Full (Time, Place, and Person)  Thought Content:  Tangential  Suicidal Thoughts:  No  Homicidal Thoughts:  No  Memory:  3/3  Judgement:  Good  Insight:  Good and Fair  Psychomotor Activity:  Normal  Concentration:  Concentration: Poor  Recall:  Good  Fund of Knowledge:  Good  Language:  Good  Akathisia:  Negative  Handed:  Right  AIMS (if indicated):     Assets:  Leisure Time Physical Health  ADL's:  Intact  Cognition:  WNL  Sleep:  Number of Hours: 4.25     Treatment Plan Summary: Daily contact with patient to assess and evaluate symptoms and progress in treatment and Medication management change in current meds probable discharge tomorrow if all goes well showing much improvement  Areli Jowett, MD 03/26/2019, 9:34 AM

## 2019-03-26 NOTE — [facility] Group Notes (Signed)
[facility] LCSW Group Therapy Note  Date/Time: 03/26/19, 1315  Type of Therapy/Topic:  Group Therapy:  Balance in Life  Participation Level:  Did not attend  Description of Group:    This group will address the concept of balance and how it feels and looks when one is unbalanced. Patients will be encouraged to process areas in their lives that are out of balance, and identify reasons for remaining unbalanced. Facilitators will guide patients utilizing problem- solving interventions to address and correct the stressor making their life unbalanced. Understanding and applying boundaries will be explored and addressed for obtaining  and maintaining a balanced life. Patients will be encouraged to explore ways to assertively make their unbalanced needs known to significant others in their lives, using other group members and facilitator for support and feedback.  Therapeutic Goals: 1. Patient will identify two or more emotions or situations they have that consume much of in their lives. 2. Patient will identify signs/triggers that life has become out of balance:  3. Patient will identify two ways to set boundaries in order to achieve balance in their lives:  4. Patient will demonstrate ability to communicate their needs through discussion and/or role plays  Summary of Patient Progress:          Therapeutic Modalities:   Cognitive Behavioral Therapy Solution-Focused Therapy Assertiveness Training  Greg Elwanda Moger, LCSW 

## 2019-03-26 NOTE — Progress Notes (Signed)
  [facility] Adult Case Management Discharge Plan :  Will you be returning to the same living situation after discharge:  Yes,  with mother At discharge, do you have transportation home?: Yes,  mother Do you have the ability to pay for your medications: No. Will work with [employer].  Release of information consent forms completed and in the chart;  Patient's signature needed at discharge.  Patient to Follow up at: Follow-up Information    [facility] Follow up on 04/02/2019.   Why:  Hospital follow up appointment is Thursday, 4/16 at 9:00a.  At this time the appointment will be conducted over the telephone.  The provider will contact patient.  Contact information: [address] [place] [address] [phone]           Next level of care provider has access to [facility] Link:no  Safety Planning and Suicide Prevention discussed: Yes,  mother     Has patient been referred to the Quitline?: Patient refused referral  Patient has been referred for addiction treatment: N/A  Lorri Frederick, LCSW 03/26/2019, 3:18 PM

## 2019-03-26 NOTE — [facility] Group Notes (Signed)
  Todd [name] Tarisha Fader 03/26/2019, 9:10 AM Participation Level:  Did Not Attend  Participation Quality:    Affect:    Cognitive:    Insight:   Engagement in Group:    Modes of Intervention:    Additional Comments:  Pt did not attend morning goals group.   Todd [name] Peniel Hass 03/26/2019, 9:07 AM

## 2019-03-26 NOTE — Progress Notes (Signed)
CSW used interpretor to inform pt mother that pt will be released tomorrow. Garner Nash, MSW, LCSW Clinical Social Worker 03/26/2019 2:52 PM

## 2019-03-26 NOTE — Progress Notes (Signed)
CSW spoke to pt regarding possible discharge tomorrow.  Pt more alert today but irritable.  CSW asked if he had spoke to his mother and he said "not to worry about it" and "my mother's not in charge of me."  CSW asked about a ride and pt again said "don't worry about it."  When pressed pt said he would walk.  Discussed follow up appt and pt said he wasn't going to go to [place].   Garner Nash, MSW, LCSW Clinical Social Worker 03/26/2019 9:30 AM

## 2019-03-26 NOTE — Progress Notes (Signed)
Recreation Therapy Notes  Date: 4.9.20 Time: 1000 Location: 500 Hall Dayroom  Group Topic: Communication, Team Building, Problem Solving  Goal Area(s) Addresses:  Patient will effectively work with peer towards shared goal.  Patient will identify skill used to make activity successful.  Patient will identify how skills used during activity can be used to reach post d/c goals.   Intervention: STEM Activity   Activity: In team's, using 20 small plastic cups, patients were asked to build the tallest free standing tower possible.    Education: Social Skills, Discharge Planning.   Education Outcome: Acknowledges education/In group clarification offered/Needs additional education.   Clinical Observations/Feedback:  Pt did not attend group.     Nami Strawder, LRT/CTRS         Richards Pherigo A 03/26/2019 11:17 AM 

## 2019-03-26 NOTE — Progress Notes (Signed)
Nursing Progress Note: 7p-7a D: Pt currently presents with a flat affect and behavior. Pt states "I dont know about meds man." Interacting minimally with the milieu. Pt reports good sleep during the previous night with current medication regimen.   A: Pt refused medications per providers orders. [name] labs and vitals were monitored throughout the night. Pt supported emotionally and encouraged to express concerns and questions. Pt educated on medications.  R: [name] safety ensured with 15 minute and environmental checks. Pt currently denies SI, HI, and VH and endorses AH. Pt verbally contracts to seek staff if SI,HI, or AVH occurs and to consult with staff before acting on any harmful thoughts. Will continue to monitor.

## 2019-03-26 NOTE — Progress Notes (Signed)
D: Patient approached laying in bed with eyes closed.   Patient denied any needs and declined further engagement by this Clinical research associate.  A: Patient given emotional support from RN. Patient encouraged to come to staff with concerns and/or questions. Patient's orders and plan of care reviewed.   R: Patient remains appropriate and cooperative. Will continue to monitor patient q15 minutes for safety.

## 2019-03-27 MED ORDER — OMEGA-3-ACID ETHYL ESTERS 1 G PO CAPS: 1.0000 g | ORAL_CAPSULE | Freq: Two times a day (BID) | ORAL | 1 refills | Status: DC

## 2019-03-27 MED ORDER — RISPERIDONE 3 MG PO TABS: 3.0000 mg | ORAL_TABLET | Freq: Every day | ORAL | 1 refills | Status: DC

## 2019-03-27 MED ORDER — PRENATAL MULTIVITAMIN CH: 1.0000 | ORAL_TABLET | Freq: Every day | ORAL | 1 refills | Status: DC

## 2019-03-27 MED ORDER — BENZTROPINE MESYLATE 0.5 MG PO TABS: 0.5000 mg | ORAL_TABLET | Freq: Two times a day (BID) | ORAL | 2 refills | Status: DC

## 2019-03-27 MED ORDER — HYDROXYZINE HCL 50 MG PO TABS: 50.0000 mg | ORAL_TABLET | Freq: Three times a day (TID) | ORAL | 1 refills | Status: DC | PRN

## 2019-03-27 MED ORDER — ARIPIPRAZOLE ER 400 MG IM SRER: 400.0000 mg | INTRAMUSCULAR | 11 refills | Status: DC

## 2019-03-27 MED ORDER — TEMAZEPAM 30 MG PO CAPS: 30.0000 mg | ORAL_CAPSULE | Freq: Every day | ORAL | 0 refills | Status: DC
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
## 2019-03-27 NOTE — Progress Notes (Signed)
Adult Psychoeducational Group Note  Date:  03/27/2019 Time:  5:04 AM  Group Topic/Focus:  Wrap-Up Group:   The focus of this group is to help patients review their daily goal of treatment and discuss progress on daily workbooks.  Participation Level:  Minimal  Participation Quality:  Drowsy  Affect:  Anxious  Cognitive:  Disorganized  Insight: Lacking  Engagement in Group:  Distracting  Modes of Intervention:  Limit-setting  Additional Comments:  Pt saod jos day was a 9. The one positive thing tha thappen to him he went outside today.  Todd [name] 03/27/2019, 5:04 AM

## 2019-03-27 NOTE — Progress Notes (Signed)
  [facility] Adult Case Management Discharge Plan :  Will you be returning to the same living situation after discharge:  Yes, home At discharge, do you have transportation home?: Yes,  bus Do you have the ability to pay for your medications: No. Referred to [facility]  Release of information consent forms completed and in the chart. Patient to Follow up at: Follow-up Information    [facility] Follow up on 04/02/2019.   Why:  Hospital follow up appointment is Thursday, 4/16 at 9:00a.  At this time the appointment will be conducted over the telephone.  The provider will contact patient.  Contact information: [address] [place] [address] [phone]           Next level of care provider has access to [facility] Link:no  Safety Planning and Suicide Prevention discussed: Yes,  with mother     Has patient been referred to the Quitline?: N/A patient is not a smoker  Patient has been referred for addiction treatment: Yes  Darreld Mclean, LCSWA 03/27/2019, 9:21 AM

## 2019-03-27 NOTE — Progress Notes (Deleted)
CSW spoke with patient on the unit regarding discharge. Patient reports he has not thought much about a discharge plan other than going to [place] at the end of this month. He is agreeable to discharge with shelter resources.  Dilyn Smiles, LCSW-A Clinical Social Worker 

## 2019-03-27 NOTE — Progress Notes (Signed)
Recreation Therapy Notes  Date: 4.10.20 Time: 1000 Location: 500 Hall Dayroom  Group Topic: Communication  Goal Area(s) Addresses:  Patient will effectively communicate with staff in group.  Patient will identify benefits of effective communication.  Behavioral Response: Minimal  Intervention:  Discussion  Activity: LRT had a discussion with patient about their day and what they wanted to accomplish over the course of the day.  Education: Communication, Discharge Planning  Education Outcome: Acknowledges understanding/In group clarification offered/Needs additional education.   Clinical Observations/Feedback: Pt was quiet but engaged when prompted.  Pt asked about his discharge.  LRT explained the discharge process to pt.  Pt was receptive.  Pt left and did not return.     [name], LRT/CTRS     [name] A 03/27/2019 11:53 AM

## 2019-03-27 NOTE — Progress Notes (Signed)
Patient ID: Todd [name], male   DOB: 11-Oct-1996, 23 y.o.   MRN: [id number] Patient denies SI, HI and AVH upon discharge.  Patient acknowledged understanding of all discharge instructions and receipt of all belongings.  Patient was in a bright mood upon discharge.

## 2019-03-27 NOTE — [facility]HH Suicide Risk Assessment (Signed)
[facility] Discharge Suicide Risk Assessment   Principal Problem: Severe manic bipolar 1 disorder with psychotic behavior (HCC) Discharge Diagnoses: Principal Problem:   Severe manic bipolar 1 disorder with psychotic behavior (HCC)   Total Time spent with patient: 45 minutes  Thought to be baseline alert oriented cooperative without acute thoughts of harming self or others no EPS or TD less intrusive overall-no acute psychosis  Mental Status Per Nursing Assessment::   On Admission:  Thoughts of violence towards others, Intention to act on plan to harm others  Demographic Factors:  Male and Unemployed  Loss Factors: Decrease in vocational status  Historical Factors: NA  Risk Reduction Factors:   Sense of responsibility to family and Religious beliefs about death  Continued Clinical Symptoms:  Schizophrenia:   Paranoid or undifferentiated type  Cognitive Features That Contribute To Risk:  Loss of executive function    Suicide Risk:  Minimal: No identifiable suicidal ideation.  Patients presenting with no risk factors but with morbid ruminations; may be classified as minimal risk based on the severity of the depressive symptoms  Follow-up Information    [facility] Follow up on 04/02/2019.   Why:  Hospital follow up appointment is Thursday, 4/16 at 9:00a.  At this time the appointment will be conducted over the telephone.  The provider will contact patient.  Contact information: [address] [place] [address] [phone]           Plan Of Care/Follow-up recommendations:  Activity:  full  Rendell Thivierge, MD 03/27/2019, 9:07 AM

## 2019-03-27 NOTE — Progress Notes (Signed)
Did not attend group 

## 2019-04-06 ENCOUNTER — Other Ambulatory Visit: Payer: Self-pay

## 2019-04-06 ENCOUNTER — Inpatient Hospital Stay (HOSPITAL_COMMUNITY)
Admission: AD | Admit: 2019-04-06 | Discharge: 2019-04-14 | DRG: 885 | Disposition: A | Discharge status: Home / Self Care | Payer: Self-pay | Source: Intra-hospital | Attending: Psychiatry | Admitting: Psychiatry

## 2019-04-06 ENCOUNTER — Observation Stay (HOSPITAL_COMMUNITY): Admission: AD | Admit: 2019-04-06 | Payer: Self-pay | Admitting: Psychiatry

## 2019-04-06 ENCOUNTER — Encounter (HOSPITAL_COMMUNITY): Payer: Self-pay

## 2019-04-06 DIAGNOSIS — Z79899 Other long term (current) drug therapy: Secondary | ICD-10-CM

## 2019-04-06 DIAGNOSIS — Z9114 Patient's other noncompliance with medication regimen: Secondary | ICD-10-CM

## 2019-04-06 DIAGNOSIS — F200 Paranoid schizophrenia: Secondary | ICD-10-CM

## 2019-04-06 DIAGNOSIS — G4700 Insomnia, unspecified: Secondary | ICD-10-CM | POA: Diagnosis present

## 2019-04-06 DIAGNOSIS — Z87891 Personal history of nicotine dependence: Secondary | ICD-10-CM

## 2019-04-06 DIAGNOSIS — Z8349 Family history of other endocrine, nutritional and metabolic diseases: Secondary | ICD-10-CM

## 2019-04-06 DIAGNOSIS — Z9119 Patient's noncompliance with other medical treatment and regimen: Secondary | ICD-10-CM

## 2019-04-06 DIAGNOSIS — F319 Bipolar disorder, unspecified: Secondary | ICD-10-CM | POA: Diagnosis present

## 2019-04-06 DIAGNOSIS — Z833 Family history of diabetes mellitus: Secondary | ICD-10-CM

## 2019-04-06 DIAGNOSIS — F333 Major depressive disorder, recurrent, severe with psychotic symptoms: Principal | ICD-10-CM | POA: Diagnosis present

## 2019-04-06 LAB — CBC
HCT: 39.4 % (ref 39.0–52.0)
Hemoglobin: 13.2 g/dL (ref 13.0–17.0)
MCH: 31.3 pg (ref 26.0–34.0)
MCHC: 33.5 g/dL (ref 30.0–36.0)
MCV: 93.4 fL (ref 80.0–100.0)
Platelets: 262 10*3/uL (ref 150–400)
RBC: 4.22 MIL/uL (ref 4.22–5.81)
RDW: 13.6 % (ref 11.5–15.5)
WBC: 9.1 10*3/uL (ref 4.0–10.5)
nRBC: 0 % (ref 0.0–0.2)

## 2019-04-06 LAB — COMPREHENSIVE METABOLIC PANEL
ALT: 42 U/L (ref 0–44)
AST: 19 U/L (ref 15–41)
Albumin: 4.1 g/dL (ref 3.5–5.0)
Alkaline Phosphatase: 59 U/L (ref 38–126)
Anion gap: 6 (ref 5–15)
BUN: 15 mg/dL (ref 6–20)
CO2: 24 mmol/L (ref 22–32)
Calcium: 8.8 mg/dL — ABNORMAL LOW (ref 8.9–10.3)
Chloride: 108 mmol/L (ref 98–111)
Creatinine, Ser: 0.72 mg/dL (ref 0.61–1.24)
GFR calc Af Amer: >60 mL/min (ref >60)
GFR calc non Af Amer: >60 mL/min (ref >60)
Glucose, Bld: 97 mg/dL (ref 70–99)
Potassium: 3.8 mmol/L (ref 3.5–5.1)
Sodium: 138 mmol/L (ref 135–145)
Total Bilirubin: 0.7 mg/dL (ref 0.3–1.2)
Total Protein: 6.5 g/dL (ref 6.5–8.1)

## 2019-04-06 MED ORDER — HYDROXYZINE HCL 25 MG PO TABS: 25.0000 mg | ORAL_TABLET | Freq: Three times a day (TID) | ORAL | Status: DC | PRN

## 2019-04-06 MED ORDER — HYDROXYZINE HCL 50 MG PO TABS: 50.0000 mg | ORAL_TABLET | Freq: Three times a day (TID) | ORAL | Status: DC | PRN

## 2019-04-06 MED ORDER — OMEGA-3-ACID ETHYL ESTERS 1 G PO CAPS
1.0000 g | ORAL_CAPSULE | Freq: Two times a day (BID) | ORAL | Status: DC
  Administered 2019-04-06 – 2019-04-14 (×16): 1 g via ORAL
  Filled 2019-04-06: qty 20
  Filled 2019-04-06 (×3): qty 1
  Filled 2019-04-06: qty 20
  Filled 2019-04-06 (×8): qty 1
  Filled 2019-04-06: qty 20
  Filled 2019-04-06 (×3): qty 1
  Filled 2019-04-06: qty 20
  Filled 2019-04-06 (×6): qty 1

## 2019-04-06 MED ORDER — TRAZODONE HCL 50 MG PO TABS: 50.0000 mg | ORAL_TABLET | Freq: Every evening | ORAL | Status: DC | PRN

## 2019-04-06 MED ORDER — ACETAMINOPHEN 325 MG PO TABS: 650.0000 mg | ORAL_TABLET | Freq: Four times a day (QID) | ORAL | Status: DC | PRN

## 2019-04-06 MED ORDER — ALUM & MAG HYDROXIDE-SIMETH 200-200-20 MG/5ML PO SUSP: 30.0000 mL | ORAL | Status: DC | PRN

## 2019-04-06 MED ORDER — MAGNESIUM HYDROXIDE 400 MG/5ML PO SUSP: 30.0000 mL | Freq: Every day | ORAL | Status: DC | PRN

## 2019-04-06 MED ORDER — TEMAZEPAM 30 MG PO CAPS
30.0000 mg | ORAL_CAPSULE | Freq: Every day | ORAL | Status: DC
  Administered 2019-04-06 – 2019-04-07 (×2): 30 mg via ORAL
  Filled 2019-04-06: qty 1
  Filled 2019-04-06: qty 2

## 2019-04-06 MED ORDER — RISPERIDONE 3 MG PO TABS
3.0000 mg | ORAL_TABLET | Freq: Every day | ORAL | Status: DC
  Administered 2019-04-06 – 2019-04-14 (×9): 3 mg via ORAL
  Filled 2019-04-06 (×10): qty 1
  Filled 2019-04-06 (×2): qty 21

## 2019-04-06 MED ORDER — PRENATAL MULTIVITAMIN CH
1.0000 | ORAL_TABLET | Freq: Every day | ORAL | Status: DC
  Administered 2019-04-06 – 2019-04-14 (×9): 1 via ORAL
  Filled 2019-04-06: qty 10
  Filled 2019-04-06 (×2): qty 1
  Filled 2019-04-06: qty 10
  Filled 2019-04-06 (×6): qty 1

## 2019-04-06 MED ORDER — RISPERIDONE 3 MG PO TABS
6.0000 mg | ORAL_TABLET | Freq: Every day | ORAL | Status: DC
  Administered 2019-04-06 – 2019-04-13 (×8): 6 mg via ORAL
  Filled 2019-04-06 (×4): qty 2
  Filled 2019-04-06: qty 20
  Filled 2019-04-06 (×3): qty 2
  Filled 2019-04-06: qty 20
  Filled 2019-04-06 (×2): qty 2

## 2019-04-06 MED ORDER — BENZTROPINE MESYLATE 0.5 MG PO TABS
0.5000 mg | ORAL_TABLET | Freq: Two times a day (BID) | ORAL | Status: DC
  Administered 2019-04-06 – 2019-04-14 (×16): 0.5 mg via ORAL
  Filled 2019-04-06 (×4): qty 1
  Filled 2019-04-06: qty 20
  Filled 2019-04-06 (×2): qty 1
  Filled 2019-04-06: qty 20
  Filled 2019-04-06: qty 1
  Filled 2019-04-06: qty 20
  Filled 2019-04-06 (×12): qty 1
  Filled 2019-04-06: qty 20
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
## 2019-04-06 NOTE — Plan of Care (Signed)
[facility] Observation Crisis Plan  Reason for Crisis Plan:  Chronic Mental Illness/Medical Illness, Crisis Stabilization and Medication Management   Plan of Care:  Referral for Inpatient Hospitalization  Family Support:      Current Living Environment:  Living Arrangements: Parent, Other relatives  Insurance:   Hospital Account    Name Acct ID Class Status Primary Coverage   Todd [name], Todd [name] [id number] BEHAVIORAL HEALTH OBSERVATION Open None        Guarantor Account (for Hospital Account [id number])    Name Relation to Pt Service Area Active? Acct Type   Todd [name], Todd [name] Self [facility] Yes Behavioral Health   Address Phone       [address] [place], [place] [address] [phone](H)          Coverage Information (for Hospital Account [id number])    Not on file      Legal Guardian:  Legal Guardian: Other:(Self)  Primary Care Provider:  Julieanne Manson, MD  Current Outpatient Providers:  none  Psychiatrist:  Name of Psychiatrist: none  Counselor/Therapist:  Name of Therapist: none  Compliant with Medications:  No  Additional Information:   Todd [name] 4/20/20203:22 AM
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
## 2019-04-06 NOTE — Tx Team (Signed)
Initial Treatment Plan 04/06/2019 4:58 PM [address] [place] LGX:[id number]    PATIENT STRESSORS: Educational concerns Medication change or noncompliance   PATIENT STRENGTHS: Communication skills Religious Affiliation Supportive family/friends   PATIENT IDENTIFIED PROBLEMS: "I stopped taking my meds"  "I hear voices and see things"                   DISCHARGE CRITERIA:  Ability to meet basic life and health needs Adequate post-discharge living arrangements Improved stabilization in mood, thinking, and/or behavior  PRELIMINARY DISCHARGE PLAN: Attend PHP/IOP Attend 12-step recovery group Outpatient therapy  PATIENT/FAMILY INVOLVEMENT: This treatment plan has been presented to and reviewed with the patient, Todd [name].  The patient and family have been given the opportunity to ask questions and make suggestions.  Raylene Miyamoto, RN 04/06/2019, 4:58 PM

## 2019-04-06 NOTE — BH Assessment (Addendum)
[facility] Assessment Progress Note  Per Juanetta Beets, DO, this pt requires psychiatric hospitalization.  Berneice Heinrich, RN, [facility] has pre-assigned pt to [facility] Rm 599-2 in anticipation of a discharge scheduled for later today.  She will call when Adult Unit is ready to receive pt.  Pt presents under IVC initiated by pt's father, and upheld by Dr Sharma Covert.  [name] nurse, Christen Bame, has been notified of pt's disposition.   Doylene Canning, [place] [facility] Coordinator [phone]   Addendum:  Pt has been assigned to Rm 506-1.  Adult Unit will be ready to receive pt at 15:00.  Christen Bame has been notified.  Doylene Canning, [place] [facility] Coordinator [phone]

## 2019-04-06 NOTE — Progress Notes (Signed)
Patient ID: Todd [name], male   DOB: 1996/11/16, 23 y.o.   MRN: [id number] Patient [facility] adult unit for further observation and continued care.
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
## 2019-04-07 DIAGNOSIS — F200 Paranoid schizophrenia: Secondary | ICD-10-CM

## 2019-04-07 NOTE — Progress Notes (Signed)
Recreation Therapy Notes  Date: 4.21.20 Time: 1000 Location: 500 Hall Dayroom  Group Topic: Self-Esteem  Goal Area(s) Addresses:  Patient will successfully identify positive attributes about themselves.  Patient will successfully identify benefit of improved self-esteem.   Intervention: Worksheet, colored pencils, markers  Activity: Mask Art.  Each patient was given an outline of a male or male face.  Patients were to fill in the face with a creation of how they see themselves.  Patients could also write words, a poem or draw other images around the face that describes them.  Education:  Self-Esteem, Building control surveyor.   Education Outcome: Acknowledges education/In group clarification offered/Needs additional education  Clinical Observations/Feedback: Pt did not attend group.    [name], LRT/CTRS         [name], Katheen Aslin A 04/07/2019 11:30 AM

## 2019-04-07 NOTE — [facility] Group Notes (Signed)
[facility] LCSW Group Therapy Note  Date/Time: 04/07/19, 1100  Type of Therapy/Topic:  Group Therapy:  Feelings about Diagnosis  Participation Level:  Did Not Attend   Mood:   Description of Group:    This group will allow patients to explore their thoughts and feelings about diagnoses they have received. Patients will be guided to explore their level of understanding and acceptance of these diagnoses. Facilitator will encourage patients to process their thoughts and feelings about the reactions of others to their diagnosis, and will guide patients in identifying ways to discuss their diagnosis with significant others in their lives. This group will be process-oriented, with patients participating in exploration of their own experiences as well as giving and receiving support and challenge from other group members.   Therapeutic Goals: 1. Patient will demonstrate understanding of diagnosis as evidence by identifying two or more symptoms of the disorder:  2. Patient will be able to express two feelings regarding the diagnosis 3. Patient will demonstrate ability to communicate their needs through discussion and/or role plays  Summary of Patient Progress:        Therapeutic Modalities:   Cognitive Behavioral Therapy Brief Therapy Feelings Identification   Greg Tiwana Chavis, LCSW 
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
## 2019-04-07 NOTE — [facility] Suicide Risk Assessment (Signed)
[facility] Admission Suicide Risk Assessment   Nursing information obtained from:  Patient, Review of record Demographic factors:  Male, Adolescent or young adult, Low socioeconomic status Current Mental Status:  Thoughts of violence towards others, Intention to act on plan to harm others Loss Factors:  Decline in physical health Historical Factors:  Impulsivity Risk Reduction Factors:  Sense of responsibility to family, Religious beliefs about death, Employed, Living with another person, especially a relative, Positive social support  Total Time spent with patient: 45 minutes Principal Problem: Exacerbation of underlying schizoaffective disorder Diagnosis:  Active Problems:   Bipolar disorder with psychotic features (HCC)   MDD (major depressive disorder), recurrent, severe, with psychosis (HCC)   Paranoid schizophrenia (HCC)  Subjective Data: Once again required petition for involuntary commitment due to general dangerousness propelled by psychosis  Continued Clinical Symptoms:  Alcohol Use Disorder Identification Test Final Score (AUDIT): 0 The "Alcohol Use Disorders Identification Test", Guidelines for Use in Primary Care, Second Edition.  World Science writer [facility]). Score between 0-7:  no or low risk or alcohol related problems. Score between 8-15:  moderate risk of alcohol related problems. Score between 16-19:  high risk of alcohol related problems. Score 20 or above:  warrants further diagnostic evaluation for alcohol dependence and treatment.   CLINICAL FACTORS:   Schizophrenia:   Paranoid or undifferentiated type   COGNITIVE FEATURES THAT CONTRIBUTE TO RISK:  Loss of executive function    SUICIDE RISK:   Minimal: No identifiable suicidal ideation.  Patients presenting with no risk factors but with morbid ruminations; may be classified as minimal risk based on the severity of the depressive symptoms  PLAN OF CARE: For stabilization  I certify that inpatient services  furnished can reasonably be expected to improve the patient's condition.   Malvin Johns, MD 04/07/2019, 9:14 AM

## 2019-04-07 NOTE — H&P (Signed)
Behavioral Health Medical Screening Exam  Todd [name] is an 23 y.o. male.  Total Time spent with patient: 30 minutes  Psychiatric Specialty Exam: Physical Exam  Constitutional: He is oriented to person, place, and time. He appears well-developed and well-nourished. No distress.  HENT:  Head: Normocephalic and atraumatic.  Right Ear: External ear normal.  Left Ear: External ear normal.  Eyes: Pupils are equal, round, and reactive to light.  Respiratory: Effort normal. No respiratory distress.  Musculoskeletal: Normal range of motion.  Neurological: He is alert and oriented to person, place, and time.  Skin: He is not diaphoretic.  Psychiatric: His mood appears anxious. His affect is blunt. His speech is not rapid and/or pressured and not tangential. He is withdrawn. He is not actively hallucinating. Thought content is not paranoid and not delusional. He exhibits a depressed mood. He expresses no homicidal and no suicidal ideation.    Review of Systems  Constitutional: Negative for chills, diaphoresis, fever, malaise/fatigue and weight loss.  Respiratory: Negative for cough and shortness of breath.   Cardiovascular: Negative for chest pain.  Gastrointestinal: Negative for diarrhea, nausea and vomiting.  Psychiatric/Behavioral: Positive for depression, hallucinations and suicidal ideas. Negative for memory loss and substance abuse. The patient is nervous/anxious and has insomnia.     Blood pressure 124/79, pulse 96, temperature 98.1 F (36.7 C), temperature source Oral, resp. rate 16, height 5\' 6"  (1.676 m), weight 64 kg.Body mass index is 22.76 kg/m.  General Appearance: Casual and Well Groomed  Eye Contact:  Good  Speech:  Clear and Coherent and Normal Rate  Volume:  Decreased  Mood:  Anxious and Depressed  Affect:  Congruent and Depressed  Thought Process:  Coherent and Descriptions of Associations: Intact  Orientation:  Full (Time, Place, and Person)   Thought Content:  Logical and Hallucinations: Denies current  Suicidal Thoughts:  No  Homicidal Thoughts:  No  Memory:  Immediate;   Fair Recent;   Fair  Judgement:  Fair  Insight:  Fair  Psychomotor Activity:  Normal  Concentration:  Concentration: Fair and Attention Span: Fair  Recall:  [employer] of Knowledge:  Fair  Language:  Good  Akathisia:  Negative  Handed:  Right  AIMS (if indicated):     Assets:  Communication Skills Housing Intimacy Leisure Time Physical Health  ADL's:  Intact  Cognition:  WNL  Sleep:         Blood pressure 104/62, pulse 80, temperature 98.1 F (36.7 C), temperature source Oral, resp. rate 17, height 5\' 7"  (1.702 m), weight 68.5 kg, SpO2 100 %.  Recommendations:  Based on my evaluation the patient does not appear to have an emergency medical condition.  Jackelyn Poling, NP 04/07/2019, 2:07 AM
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
## 2019-04-07 NOTE — Progress Notes (Signed)
   04/07/19 0850  COVID-19 Daily Checkoff  Have you had a fever (temp > 37.80C/100F)  in the past 24 hours?  No  If you have had runny nose, nasal congestion, sneezing in the past 24 hours, has it worsened? No  COVID-19 EXPOSURE  Have you traveled outside the state in the past 14 days? No  Have you been in contact with someone with a confirmed diagnosis of COVID-19 or PUI in the past 14 days without wearing appropriate PPE? No  Have you been living in the same home as a person with confirmed diagnosis of COVID-19 or a PUI (household contact)? No  Have you been diagnosed with COVID-19? No   

## 2019-04-07 NOTE — Progress Notes (Signed)
   04/07/19 0850  COVID-19 Daily Checkoff  Have you had a fever (temp > 37.80C/100F)  in the past 24 hours?  No  If you have had runny nose, nasal congestion, sneezing in the past 24 hours, has it worsened? No  COVID-19 EXPOSURE  Have you traveled outside the state in the past 14 days? No  Have you been in contact with someone with a confirmed diagnosis of COVID-19 or PUI in the past 14 days without wearing appropriate PPE? No  Have you been living in the same home as a person with confirmed diagnosis of COVID-19 or a PUI (household contact)? No  Have you been diagnosed with COVID-19? No

## 2019-04-07 NOTE — Progress Notes (Signed)
D: Pt denies SI/HI/AVH. Pt continues to be paranoid and suspicious on the unit. Pt stated he was doing better since coming in, pt stated he was doing what he was supposed to do.  A: Pt was offered support and encouragement. Pt was given scheduled medications. Pt was encourage to attend groups. Q 15 minute checks were done for safety.  R: safety maintained on unit.  Problem: Activity: Goal: Sleeping patterns will improve Outcome: Progressing   Problem: Coping: Goal: Ability to demonstrate self-control will improve Outcome: Progressing

## 2019-04-07 NOTE — Progress Notes (Signed)
Patient refused his 1700 meds.

## 2019-04-07 NOTE — Progress Notes (Signed)
D:  Emrys was in the day room much of the evening listening to music.  No interaction with peers but would interact with staff when encouraged.  He denied SI/HI or A/V hallucinations although he was noted smiling at inappropriate times and occasionally blurting out random words.  He denied any pain or discomfort and appeared to be in no physical distress.  He took his hs medications without difficulty.  He is currently resting with his eyes closed and appears to be asleep. A:  1:1 with RN for support and encouragement.  Medications as ordered.  Q 15 minute checks maintained for safety.  Encouraged participation in group and unit activities.   R:  Donnal remains safe on the unit.  We will continue to monitor the progress towards his goals.
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
## 2019-04-07 NOTE — Plan of Care (Signed)
D: Patient presents isolative, irritable. Patient denies SI/HI/AVH. He refused his morning vitals. He also refused his morning meds, and then later agreed to take them around lunch. He did not fill out a morning self-inventory.  A: Patient checked q15 min, and checks reviewed. Reviewed medication changes with patient and educated on side effects. Educated patient on importance of attending group therapy sessions and educated on several coping skills. Encouarged participation in milieu through recreation therapy and attending meals with peers. Support and encouragement provided. Fluids offered. R: Patient receptive to education on medications, and is medication compliant. Patient contracts for safety on the unit.  Problem: Education: Goal: Emotional status will improve Outcome: Not Progressing Goal: Mental status will improve Outcome: Not Progressing   Problem: Activity: Goal: Interest or engagement in activities will improve Outcome: Not Progressing Goal: Sleeping patterns will improve Outcome: Not Progressing

## 2019-04-08 MED ORDER — TEMAZEPAM 30 MG PO CAPS
30.0000 mg | ORAL_CAPSULE | Freq: Every day | ORAL | Status: DC
  Administered 2019-04-08 – 2019-04-11 (×4): 30 mg via ORAL
  Filled 2019-04-08 (×4): qty 1

## 2019-04-08 NOTE — Progress Notes (Signed)
Psychoeducational Group Note  Date:  04/08/2019 Time:  0910  Group Topic/Focus:  Self Esteem Action Plan:   The focus of this group is to help patients create a plan to continue to build self-esteem after discharge.  Participation Level: Did Not Attend  Participation Quality:  Not Applicable  Affect:  Not Applicable  Cognitive:  Not Applicable  Insight:  Not Applicable  Engagement in Group: Not Applicable  Additional Comments:  Pt said he could not attend group this morning because he did not want to sit in the group with a lot of people.  Sulay Brymer E 04/08/2019, 9:14 AM
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
## 2019-04-08 NOTE — Tx Team (Signed)
Interdisciplinary Treatment and Diagnostic Plan Update  04/08/2019 Time of Session: [address] Todd [name] MRN: [id number]  Principal Diagnosis: <principal problem not specified>  Secondary Diagnoses: Active Problems:   Bipolar disorder with psychotic features (HCC)   MDD (major depressive disorder), recurrent, severe, with psychosis (HCC)   Paranoid schizophrenia (HCC)   Current Medications:  Current Facility-Administered Medications  Medication Dose Route Frequency Provider Last Rate Last Dose  . alum & mag hydroxide-simeth (MAALOX/MYLANTA) 200-200-20 MG/5ML suspension 30 mL  30 mL Oral Q4H PRN Oneta RackLewis, Tanika N, NP      . benztropine (COGENTIN) tablet 0.5 mg  0.5 mg Oral BID Nira ConnBerry, Jason A, NP   0.5 mg at 04/08/19 0805  . hydrOXYzine (ATARAX/VISTARIL) tablet 50 mg  50 mg Oral TID PRN Nira ConnBerry, Jason A, NP      . magnesium hydroxide (MILK OF MAGNESIA) suspension 30 mL  30 mL Oral Daily PRN Oneta RackLewis, Tanika N, NP      . omega-3 acid ethyl esters (LOVAZA) capsule 1 g  1 g Oral BID Nira ConnBerry, Jason A, NP   1 g at 04/08/19 0805  . prenatal multivitamin tablet 1 tablet  1 tablet Oral Q1200 Nira ConnBerry, Jason A, NP   1 tablet at 04/07/19 1131  . risperiDONE (RISPERDAL) tablet 3 mg  3 mg Oral Daily Nira ConnBerry, Jason A, NP   3 mg at 04/08/19 0805  . risperiDONE (RISPERDAL) tablet 6 mg  6 mg Oral QHS Nira ConnBerry, Jason A, NP   6 mg at 04/07/19 2057  . temazepam (RESTORIL) capsule 30 mg  30 mg Oral QHS Malvin JohnsFarah, Brian, MD       PTA Medications: Medications Prior to Admission  Medication Sig Dispense Refill Last Dose  . [START ON 04/21/2019] ARIPiprazole ER (ABILIFY MAINTENA) 400 MG SRER injection Inject 2 mLs (400 mg total) into the muscle every 28 (twenty-eight) days. Due 5/6 1 each 11 Past Month at Unknown time  . benztropine (COGENTIN) 0.5 MG tablet Take 1 tablet (0.5 mg total) by mouth 2 (two) times daily. 60 tablet 2 Past Month at Unknown time  . risperiDONE (RISPERDAL) 3 MG tablet Take 1 tablet (3 mg total)  by mouth daily. And 2 at hs 90 tablet 1 Past Month at Unknown time  . temazepam (RESTORIL) 30 MG capsule Take 1 capsule (30 mg total) by mouth at bedtime. 30 capsule 0 Past Month at Unknown time    Patient Stressors: Educational concerns Medication change or noncompliance  Patient Strengths: Manufacturing systems engineerCommunication skills Religious Affiliation Supportive family/friends  Treatment Modalities: Medication Management, Group therapy, Case management,  1 to 1 session with clinician, Psychoeducation, Recreational therapy.   Physician Treatment Plan for Primary Diagnosis: <principal problem not specified> Long Term Goal(s): Improvement in symptoms so as ready for discharge Improvement in symptoms so as ready for discharge   Short Term Goals: Compliance with prescribed medications will improve Compliance with prescribed medications will improve  Medication Management: Evaluate patient's response, side effects, and tolerance of medication regimen.  Therapeutic Interventions: 1 to 1 sessions, Unit Group sessions and Medication administration.  Evaluation of Outcomes: Progressing  Physician Treatment Plan for Secondary Diagnosis: Active Problems:   Bipolar disorder with psychotic features (HCC)   MDD (major depressive disorder), recurrent, severe, with psychosis (HCC)   Paranoid schizophrenia (HCC)  Long Term Goal(s): Improvement in symptoms so as ready for discharge Improvement in symptoms so as ready for discharge   Short Term Goals: Compliance with prescribed medications will improve Compliance with prescribed medications will improve  Medication Management: Evaluate patient's response, side effects, and tolerance of medication regimen.  Therapeutic Interventions: 1 to 1 sessions, Unit Group sessions and Medication administration.  Evaluation of Outcomes: Progressing   RN Treatment Plan for Primary Diagnosis: <principal problem not specified> Long Term Goal(s): Knowledge of disease and  therapeutic regimen to maintain health will improve  Short Term Goals: Ability to identify and develop effective coping behaviors will improve and Compliance with prescribed medications will improve  Medication Management: RN will administer medications as ordered by provider, will assess and evaluate patient's response and provide education to patient for prescribed medication. RN will report any adverse and/or side effects to prescribing provider.  Therapeutic Interventions: 1 on 1 counseling sessions, Psychoeducation, Medication administration, Evaluate responses to treatment, Monitor vital signs and CBGs as ordered, Perform/monitor CIWA, COWS, AIMS and Fall Risk screenings as ordered, Perform wound care treatments as ordered.  Evaluation of Outcomes: Progressing   LCSW Treatment Plan for Primary Diagnosis: <principal problem not specified> Long Term Goal(s): Safe transition to appropriate next level of care at discharge, Engage patient in therapeutic group addressing interpersonal concerns.  Short Term Goals: Engage patient in aftercare planning with referrals and resources, Increase social support and Increase skills for wellness and recovery  Therapeutic Interventions: Assess for all discharge needs, 1 to 1 time with Social worker, Explore available resources and support systems, Assess for adequacy in community support network, Educate family and significant other(s) on suicide prevention, Complete Psychosocial Assessment, Interpersonal group therapy.  Evaluation of Outcomes: Progressing   Progress in Treatment: Attending groups: No. Participating in groups: No. Taking medication as prescribed: Yes. Toleration medication: Yes. Family/Significant other contact made: No, will contact:  mother Patient understands diagnosis: No. Discussing patient identified problems/goals with staff: Yes. Medical problems stabilized or resolved: Yes. Denies suicidal/homicidal ideation:  Yes. Issues/concerns per patient self-inventory: No. Other: None  New problem(s) identified: No, Describe:  none  New Short Term/Long Term Goal(s):  Patient Goals:  "getting rest"  Discharge Plan or Barriers:   Reason for Continuation of Hospitalization: Delusions  Hallucinations Medication stabilization  Estimated Length of Stay: 2-4 days.  Attendees: Patient:Todd Pasty Arch 04/08/2019   Physician: Dr. Jeannine Kitten, MD 04/08/2019   Nursing: Dossie Arbour, RN 04/08/2019   RN Care Manager: 04/08/2019   Social Worker: Daleen Squibb, LCSW 04/08/2019   Recreational Therapist:  04/08/2019   Other: Stephannie Peters, LCSW 04/08/2019   Other:  04/08/2019   Other: 04/08/2019        Scribe for Treatment Team: Lorri Frederick, LCSW 04/08/2019 11:14 AM

## 2019-04-08 NOTE — Progress Notes (Signed)
[facility] MD Progress Note  04/08/2019 10:00 AM Todd [name]  MRN:  [id number] Subjective:    Less focused on the issue of discharge stating that at home he was in fact hearing and seeing things but would not elaborate content he denies hearing and seeing things here in the hospital denies thoughts of harming self or others no EPS or TD Principal Problem: Exacerbation of underlying psychotic disorder Diagnosis: Active Problems:   Bipolar disorder with psychotic features (HCC)   MDD (major depressive disorder), recurrent, severe, with psychosis (HCC)   Paranoid schizophrenia (HCC)  Total Time spent with patient: 20 minutes  Past Medical History:  Past Medical History:  Diagnosis Date  . Bipolar 1 disorder (HCC)    History reviewed. No pertinent surgical history. Family History:  Family History  Problem Relation Age of Onset  . Hyperlipidemia Father   . Diabetes Maternal Grandmother   . Alcohol abuse Paternal Grandfather     Social History:  Social History   Substance and Sexual Activity  Alcohol Use Yes  . Alcohol/week: 0.0 standard drinks   Comment: Beer and Tequila on weekends.  3 beers and 4 shots of Tequila over 2 weekends per month     Social History   Substance and Sexual Activity  Drug Use Yes  . Types: Marijuana   Comment: Marijuana:  starts and stops.  When smoking, daily.  Tested every 2 weeks due to taking prescription drug abuse--stopped in [place].  Goes throught 05/2016. Has used MJ since 6th grade.  Went to [employer] a lot daily.    Social History   Socioeconomic History  . Marital status: Single    Spouse name: Not on file  . Number of children: 0  . Years of education: Not on file  . Highest education level: Not on file  Occupational History  . 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 Financial resource strain: Not on file  . Food insecurity:    Worry: Not on file    Inability: Not on file  .  Transportation needs:    Medical: Not on file    Non-medical: Not on file  Tobacco Use  . Smoking status: Former Smoker    Packs/day: 0.00  . Smokeless tobacco: Never Used  Substance and Sexual Activity  . Alcohol use: Yes    Alcohol/week: 0.0 standard drinks    Comment: Beer and Tequila on weekends.  3 beers and 4 shots of Tequila over 2 weekends per month  . Drug use: Yes    Types: Marijuana    Comment: Marijuana:  starts and stops.  When smoking, daily.  Tested every 2 weeks due to taking prescription drug abuse--stopped in [place].  Goes throught 05/2016. Has used MJ since 6th grade.  Went to [employer] a lot daily.  Marland Kitchen Sexual activity: Yes    Partners: Female    Birth control/protection: Condom  Lifestyle  . Physical activity:    Days per week: Not on file    Minutes per session: Not on file  . Stress: Not on file  Relationships  . Social connections:    Talks on phone: Not on file    Gets together: Not on file    Attends religious service: Not on file    Active member of club or organization: Not on file    Attends meetings of clubs or organizations: Not on file    Relationship status: Not on file  Other Topics  Concern  . Not on file  Social History Narrative   [place]   [place] 23 [place]     Lives with parents and 23 yo sister.    Additional Social History:    Pain Medications: see  MAR Prescriptions: see MAR Over the Counter: see MAR History of alcohol / drug use?: Yes Longest period of sobriety (when/how long): family states that they do not believe that patient has been drinking at least for the past six months Negative Consequences of Use: Legal Name of Substance 1: alcohol 1 - Age of First Use: unknown 1 - Amount (size/oz): 3 beers and 2 shots  1 - Frequency: twice monthly 1 - Duration: unknown 1 - Last Use / Amount: Unknown                  Sleep: Good  Appetite:  Good  Current Medications: Current  Facility-Administered Medications  Medication Dose Route Frequency Provider Last Rate Last Dose  . alum & mag hydroxide-simeth (MAALOX/MYLANTA) 200-200-20 MG/5ML suspension 30 mL  30 mL Oral Q4H PRN Oneta RackLewis, Tanika N, NP      . benztropine (COGENTIN) tablet 0.5 mg  0.5 mg Oral BID Nira ConnBerry, Jason A, NP   0.5 mg at 04/08/19 0805  . hydrOXYzine (ATARAX/VISTARIL) tablet 50 mg  50 mg Oral TID PRN Nira ConnBerry, Jason A, NP      . magnesium hydroxide (MILK OF MAGNESIA) suspension 30 mL  30 mL Oral Daily PRN Oneta RackLewis, Tanika N, NP      . omega-3 acid ethyl esters (LOVAZA) capsule 1 g  1 g Oral BID Nira ConnBerry, Jason A, NP   1 g at 04/08/19 0805  . prenatal multivitamin tablet 1 tablet  1 tablet Oral Q1200 Nira ConnBerry, Jason A, NP   1 tablet at 04/07/19 1131  . risperiDONE (RISPERDAL) tablet 3 mg  3 mg Oral Daily Nira ConnBerry, Jason A, NP   3 mg at 04/08/19 0805  . risperiDONE (RISPERDAL) tablet 6 mg  6 mg Oral QHS Nira ConnBerry, Jason A, NP   6 mg at 04/07/19 2057  . temazepam (RESTORIL) capsule 30 mg  30 mg Oral QHS Malvin JohnsFarah, Jenissa Tyrell, MD        Lab Results: No results found for this or any previous visit (from the past 48 hour(s)).  Blood Alcohol level:  Lab Results  Component Value Date   ETH <10 03/18/2019   ETH <10 04/05/2018    Metabolic Disorder Labs: Lab Results  Component Value Date   HGBA1C 5.4 06/01/2017   MPG 108 06/01/2017   Lab Results  Component Value Date   PROLACTIN 31.5 (H) 06/01/2017   PROLACTIN 34.6 (H) 03/17/2016   Lab Results  Component Value Date   CHOL 182 06/01/2017   TRIG 75 06/01/2017   HDL 36 (L) 06/01/2017   CHOLHDL 5.1 06/01/2017   VLDL 15 06/01/2017   LDLCALC 131 (H) 06/01/2017   LDLCALC 106 03/01/2016    Physical Findings: AIMS: Facial and Oral Movements Muscles of Facial Expression: None, normal Lips and Perioral Area: None, normal Jaw: None, normal Tongue: None, normal,Extremity Movements Upper (arms, wrists, hands, fingers): None, normal Lower (legs, knees, ankles, toes): None, normal,  Trunk Movements Neck, shoulders, hips: None, normal, Overall Severity Severity of abnormal movements (highest score from questions above): None, normal Incapacitation due to abnormal movements: None, normal Patient's awareness of abnormal movements (rate only patient's report): No Awareness, Dental Status Current problems with teeth and/or dentures?: No Does patient  usually wear dentures?: No  CIWA:  CIWA-Ar Total: 0 COWS:     Musculoskeletal: Strength & Muscle Tone: within normal limits Gait & Station: normal Patient leans: N/A  Psychiatric Specialty Exam: Physical Exam  ROS  Blood pressure 104/62, pulse 80, temperature 98.1 F (36.7 C), temperature source Oral, resp. rate 17, height  (1.702 m), weight 68.5 kg, SpO2 100 %.Body mass index is 23.65 kg/m.  General Appearance: Casual  Eye Contact:  Good  Speech:  Clear and Coherent  Volume:  Decreased  Mood:  Dysphoric  Affect:  Blunt and Constricted  Thought Process:  Goal Directed  Orientation:  Full (Time, Place, and Person)  Thought Content:  Delusions and Tangential  Suicidal Thoughts:  No  Homicidal Thoughts:  No  Memory:  Immediate;   Fair  Judgement:  Fair  Insight:  Fair  Psychomotor Activity:  Normal  Concentration:  Concentration: Fair  Recall:  Fair  Fund of Knowledge:  Good  Language:  Good  Akathisia:  Negative  Handed:  Right  AIMS (if indicated):     Assets:  Physical Health Resilience  ADL's:  Intact  Cognition:  WNL  Sleep:  Number of Hours: 6.75     Treatment Plan Summary: Daily contact with patient to assess and evaluate symptoms and progress in treatment, Medication management and Plan Continue current med regimen discussed again long-acting injectable continue reality based therapies continue groups and 15-minute precautions  Neriyah Cercone, MD 04/08/2019, 10:00 AM

## 2019-04-08 NOTE — [facility] Group Notes (Signed)
  [facility] LCSW Group Therapy Note  Date/Time: 04/08/2019, 1315  Type of Therapy/Topic:  Group Therapy:  Emotion Regulation  Participation Level:  Did Not Attend   Mood:  Description of Group:    The purpose of this group is to assist patients in learning to regulate negative emotions and experience positive emotions. Patients will be guided to discuss ways in which they have been vulnerable to their negative emotions. These vulnerabilities will be juxtaposed with experiences of positive emotions or situations, and patients challenged to use positive emotions to combat negative ones. Special emphasis will be placed on coping with negative emotions in conflict situations, and patients will process healthy conflict resolution skills.  Therapeutic Goals: 1. Patient will identify two positive emotions or experiences to reflect on in order to balance out negative emotions:  2. Patient will label two or more emotions that they find the most difficult to experience:  3. Patient will be able to demonstrate positive conflict resolution skills through discussion or role plays:   Summary of Patient Progress:       Therapeutic Modalities:   Cognitive Behavioral Therapy Feelings Identification Dialectical Behavioral Therapy  Greg Wierda, LCSW 

## 2019-04-08 NOTE — Progress Notes (Signed)
Recreation Therapy Notes  Date:  4.22.20 Time:  1000 Location: 500 Hall Day Room  Group Topic: Communication, Team Building, Problem Solving  Goal Area(s) Addresses:  Patient will effectively work with peer towards shared goal.  Patient will identify skill used to make activity successful.  Patient will identify how skills used during activity can be used to reach post d/c goals.   Intervention: STEM Activity   Activity:  [employer].  In teams, patients were asked to build the tallest freestanding tower possible using 15 pipe cleaner.  During the course of the activity, LRT would include stipulations such as putting one hand behind your back and not talking to your partners.  Education: Pharmacist, community, Building control surveyor.   Education Outcome: Acknowledges education  Clinical Observations/Feedback:  Pt did not attend group.    [name], LRT/CTRS         [name], [name] A 04/08/2019 11:02 AM

## 2019-04-08 NOTE — Plan of Care (Signed)
  Problem: Activity: Goal: Sleeping patterns will improve Outcome: Progressing   Problem: Coping: Goal: Ability to verbalize frustrations and anger appropriately will improve Outcome: Progressing   D: Pt alert and oriented on the unit. Pt denies SI/HI, A/VH. [name] affect was flat and sad. Pt isolated to his room for most of the day. Pt stated that his goal is "relaxing." Pt is pleasant and cooperative on the unit. A: Education, support and encouragement provided, q15 minute safety checks remain in effect. Medications administered per MD orders. R: No reactions/side effects to medicine noted. Pt denies any concerns at this time, and verbally contracts for safety. Pt ambulating on the unit with no issues. Pt remains safe on and off the unit.

## 2019-04-08 NOTE — Progress Notes (Signed)
D: Pt denies SI/HI/AVH. Pt paranoid and delusional and confrontational at times. Pt visible in the dayroom some of the evening.  A: Pt was offered support and encouragement. Pt was given scheduled medications. Pt was encourage to attend groups. Q 15 minute checks were done for safety.  R: safety maintained on unit.  Problem: Activity: Goal: Sleeping patterns will improve Outcome: Progressing

## 2019-04-08 NOTE — Progress Notes (Signed)
Patient ID: Todd [name], male   DOB: 07/18/1996, 22 y.o.   MRN: [id number]  [place] NOVEL CORONAVIRUS (COVID-19) DAILY CHECK-OFF SYMPTOMS - answer yes or no to each - every day NO YES  Have you had a fever in the past 24 hours?  . Fever (Temp > 37.80C / 100F) X   Have you had any of these symptoms in the past 24 hours? . New Cough .  Sore Throat  .  Shortness of Breath .  Difficulty Breathing .  Unexplained Body Aches   X   Have you had any one of these symptoms in the past 24 hours not related to allergies?   . Runny Nose .  Nasal Congestion .  Sneezing   X   If you have had runny nose, nasal congestion, sneezing in the past 24 hours, has it worsened?  X   EXPOSURES - check yes or no X   Have you traveled outside the state in the past 14 days?  X   Have you been in contact with someone with a confirmed diagnosis of COVID-19 or PUI in the past 14 days without wearing appropriate PPE?  X   Have you been living in the same home as a person with confirmed diagnosis of COVID-19 or a PUI (household contact)?    X   Have you been diagnosed with COVID-19?    X              What to do next: Answered NO to all: Answered YES to anything:   Proceed with unit schedule Follow the [facility] Inpatient Flowsheet.   

## 2019-04-09 NOTE — [facility] Group Notes (Signed)
[facility] LCSW Group Therapy Note  Date/Time: 04/09/19, 1315  Type of Therapy/Topic:  Group Therapy:  Balance in Life  Participation Level:  moderate  Description of Group:    This group will address the concept of balance and how it feels and looks when one is unbalanced. Patients will be encouraged to process areas in their lives that are out of balance, and identify reasons for remaining unbalanced. Facilitators will guide patients utilizing problem- solving interventions to address and correct the stressor making their life unbalanced. Understanding and applying boundaries will be explored and addressed for obtaining  and maintaining a balanced life. Patients will be encouraged to explore ways to assertively make their unbalanced needs known to significant others in their lives, using other group members and facilitator for support and feedback.  Therapeutic Goals: 1. Patient will identify two or more emotions or situations they have that consume much of in their lives. 2. Patient will identify signs/triggers that life has become out of balance:  3. Patient will identify two ways to set boundaries in order to achieve balance in their lives:  4. Patient will demonstrate ability to communicate their needs through discussion and/or role plays  Summary of Patient Progress:CSW persisted in trying to get pt to come to group and pt finally came.  Pt was attentive mostly, did participate in the group discussion somewhat.  He said "everything" was out of balance in his life and then said he did not want to give any details because they were "personal."  Pt was engaged and appropriate.          Therapeutic Modalities:   Cognitive Behavioral Therapy Solution-Focused Therapy Assertiveness Training  Daleen Squibb, [place]

## 2019-04-09 NOTE — Progress Notes (Signed)
[facility] MD Progress Note  04/09/2019 10:54 AM Todd [name] Todd [name]  MRN:  [id number] Subjective:   Principal Problem: <principal problem not specified> Diagnosis: Active Problems:   Bipolar disorder with psychotic features (HCC)   MDD (major depressive disorder), recurrent, severe, with psychosis (HCC)   Paranoid schizophrenia (HCC)  Total Time spent with patient: 20 minutes  Past Medical History:  Past Medical History:  Diagnosis Date  . Bipolar 1 disorder (HCC)    History reviewed. No pertinent surgical history. Family History:  Family History  Problem Relation Age of Onset  . Hyperlipidemia Father   . Diabetes Maternal Grandmother   . Alcohol abuse Paternal Grandfather     Social History:  Social History   Substance and Sexual Activity  Alcohol Use Yes  . Alcohol/week: 0.0 standard drinks   Comment: Beer and Tequila on weekends.  3 beers and 4 shots of Tequila over 2 weekends per month     Social History   Substance and Sexual Activity  Drug Use Yes  . Types: Marijuana   Comment: Marijuana:  starts and stops.  When smoking, daily.  Tested every 2 weeks due to taking prescription drug abuse--stopped in [place].  Goes throught 05/2016. Has used MJ since 6th grade.  Went to [employer] a lot daily.    Social History   Socioeconomic History  . Marital status: Single    Spouse name: Not on file  . Number of children: 0  . Years of education: Not on file  . Highest education level: Not on file  Occupational History  . 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 Financial resource strain: Not on file  . Food insecurity:    Worry: Not on file    Inability: Not on file  . Transportation needs:    Medical: Not on file    Non-medical: Not on file  Tobacco Use  . Smoking status: Former Smoker    Packs/day: 0.00  . Smokeless tobacco: Never Used  Substance and Sexual Activity  . Alcohol use: Yes    Alcohol/week: 0.0 standard  drinks    Comment: Beer and Tequila on weekends.  3 beers and 4 shots of Tequila over 2 weekends per month  . Drug use: Yes    Types: Marijuana    Comment: Marijuana:  starts and stops.  When smoking, daily.  Tested every 2 weeks due to taking prescription drug abuse--stopped in [place].  Goes throught 05/2016. Has used MJ since 6th grade.  Went to [employer] a lot daily.  Marland Kitchen Sexual activity: Yes    Partners: Female    Birth control/protection: Condom  Lifestyle  . Physical activity:    Days per week: Not on file    Minutes per session: Not on file  . Stress: Not on file  Relationships  . Social connections:    Talks on phone: Not on file    Gets together: Not on file    Attends religious service: Not on file    Active member of club or organization: Not on file    Attends meetings of clubs or organizations: Not on file    Relationship status: Not on file  Other Topics Concern  . Not on file  Social History Narrative   Originally from [place]   Came to [employer]. When 23 years old.     Lives with parents and 49 yo sister.    Additional Social History:    Pain Medications: see  MAR Prescriptions: see MAR Over the Counter: see MAR History of alcohol / drug use?: Yes Longest period of sobriety (when/how long): family states that they do not believe that patient has been drinking at least for the past six months Negative Consequences of Use: Legal Name of Substance 1: alcohol 1 - Age of First Use: unknown 1 - Amount (size/oz): 3 beers and 2 shots  1 - Frequency: twice monthly 1 - Duration: unknown 1 - Last Use / Amount: Unknown                  Sleep: Good  Appetite:  Good  Current Medications: Current Facility-Administered Medications  Medication Dose Route Frequency Provider Last Rate Last Dose  . alum & mag hydroxide-simeth (MAALOX/MYLANTA) 200-200-20 MG/5ML suspension 30 mL  30 mL Oral Q4H PRN Oneta Rack, NP      . benztropine (COGENTIN) tablet 0.5  mg  0.5 mg Oral BID Nira Conn A, NP   0.5 mg at 04/08/19 1750  . hydrOXYzine (ATARAX/VISTARIL) tablet 50 mg  50 mg Oral TID PRN Nira Conn A, NP      . magnesium hydroxide (MILK OF MAGNESIA) suspension 30 mL  30 mL Oral Daily PRN Oneta Rack, NP      . omega-3 acid ethyl esters (LOVAZA) capsule 1 g  1 g Oral BID Nira Conn A, NP   1 g at 04/08/19 1750  . prenatal multivitamin tablet 1 tablet  1 tablet Oral Q1200 Nira Conn A, NP   1 tablet at 04/08/19 1204  . risperiDONE (RISPERDAL) tablet 3 mg  3 mg Oral Daily Nira Conn A, NP   3 mg at 04/08/19 0805  . risperiDONE (RISPERDAL) tablet 6 mg  6 mg Oral QHS Nira Conn A, NP   6 mg at 04/08/19 2058  . temazepam (RESTORIL) capsule 30 mg  30 mg Oral QHS Malvin Johns, MD   30 mg at 04/08/19 2058    Lab Results: No results found for this or any previous visit (from the past 48 hour(s)).  Blood Alcohol level:  Lab Results  Component Value Date   ETH <10 03/18/2019   ETH <10 04/05/2018    Metabolic Disorder Labs: Lab Results  Component Value Date   HGBA1C 5.4 06/01/2017   MPG 108 06/01/2017   Lab Results  Component Value Date   PROLACTIN 31.5 (H) 06/01/2017   PROLACTIN 34.6 (H) 03/17/2016   Lab Results  Component Value Date   CHOL 182 06/01/2017   TRIG 75 06/01/2017   HDL 36 (L) 06/01/2017   CHOLHDL 5.1 06/01/2017   VLDL 15 06/01/2017   LDLCALC 131 (H) 06/01/2017   LDLCALC 106 03/01/2016    Physical Findings: AIMS: Facial and Oral Movements Muscles of Facial Expression: None, normal Lips and Perioral Area: None, normal Jaw: None, normal Tongue: None, normal,Extremity Movements Upper (arms, wrists, hands, fingers): None, normal Lower (legs, knees, ankles, toes): None, normal, Trunk Movements Neck, shoulders, hips: None, normal, Overall Severity Severity of abnormal movements (highest score from questions above): None, normal Incapacitation due to abnormal movements: None, normal Patient's awareness of  abnormal movements (rate only patient's report): No Awareness, Dental Status Current problems with teeth and/or dentures?: No Does patient usually wear dentures?: No  CIWA:  CIWA-Ar Total: 0 COWS:     Musculoskeletal: Strength & Muscle Tone: within normal limits Gait & Station: normal Patient leans: N/A  Psychiatric Specialty Exam: Physical Exam  ROS  Blood pressure 104/62, pulse 80, temperature  98.1 F (36.7 C), temperature source Oral, resp. rate 17, height 5\' 7"  (1.702 m), weight 68.5 kg, SpO2 100 %.Body mass index is 23.65 kg/m.  General Appearance: Casual  Eye Contact:  Minimal  Speech:  Clear and Coherent  Volume:  Normal  Mood:  Dysphoric  Affect:  Constricted  Thought Process:  Irrelevant and Descriptions of Associations: Circumstantial  Orientation:  Full (Time, Place, and Person)  Thought Content:  Illogical and Delusions  Suicidal Thoughts:  No  Homicidal Thoughts:  No  Memory:  Immediate;   Good  Judgement:  Good  Insight: Improving with reality-based therapy  Psychomotor Activity:  Normal  Concentration:  Concentration: Fair  Recall:  Fair  Fund of Knowledge:  Fair  Language: Paucity of content reflecting negative symptoms and poverty of content of thought  Akathisia:  Negative  Handed:  Right  AIMS (if indicated):     Assets:  Resilience Social Support  ADL's:  Intact  Cognition:  WNL  Sleep:  Number of Hours: 6.25     Treatment Plan Summary: Daily contact with patient to assess and evaluate symptoms and progress in treatment and Medication management continue cognitive therapy reality-based therapy continue current measures will need long-acting injectable before discharge  Delawrence Fridman, MD 04/09/2019, 10:54 AM

## 2019-04-09 NOTE — [facility] Group Notes (Signed)
Pt did not attend morning goals group. 

## 2019-04-09 NOTE — Progress Notes (Signed)
Recreation Therapy Notes  Date: 4.23.20 Time: 1000 Location: 500 Hall Dayroom  Group Topic: Goal Setting  Goal Area(s) Addresses:  Patient will be able to identify at least 3 life goals.  Patient will be able to identify benefit of setting life goals.   Intervention: Worksheet, pencils  Activity:  Goal Planning.  Patients were to set goals they want to accomplish within the week, month, year and five years.  Patients were to then identify any obstacles that would prevent them from reaching their goals, what they need to achieve goals and what they can start doing not to work towards goal.  Education:  Discharge Planning, Coping Skills, Leisure Education   Education Outcome: Acknowledges Education/In Group Clarification Provided/Needs Additional Education  Clinical Observations:  Pt did not attend group.    Todd [name], LRT/CTRS         Todd [name] 04/09/2019 10:55 AM 

## 2019-04-09 NOTE — Plan of Care (Signed)
Progress note  D: pt found in bed; allowed to rest. Pt denies any physical pain or symptoms upon awakening. Pt states he slept well. Pt states he is feeling better. Pt denies si/hi but is still hearing voices and seeing things. Pt states he always does. Pt states he will come talk to staff before harming himself/others while at [facility]. A: pt provided support and encouragement. Pt given medication per protocol and standing orders. Q80m safety checks implemented and continued.  R: pt safe on the unit. Will continue to monitor.   Pt progressing in the following metrics  Problem: Education: Goal: Knowledge of [facility] Education information/materials will improve Outcome: Progressing Goal: Verbalization of understanding the information provided will improve Outcome: Progressing   Problem: Health Behavior/Discharge Planning: Goal: Identification of resources available to assist in meeting health care needs will improve Outcome: Progressing Goal: Compliance with treatment plan for underlying cause of condition will improve Outcome: Progressing

## 2019-04-10 ENCOUNTER — Telehealth: Payer: Self-pay | Admitting: Pediatric Intensive Care

## 2019-04-10 NOTE — Progress Notes (Signed)
Did not attend group 

## 2019-04-10 NOTE — Plan of Care (Signed)
  Problem: Education: Goal: Emotional status will improve Outcome: Progressing   D: Pt alert and oriented on the unit and denies SI/HI, A/VH. Pt was minimal with interaction among RN staff and other pts. Pt took several naps throughout the day and sat in the day room watching television. Pt is pleasant and cooperative. A: Education, support and encouragement provided, q15 minute safety checks remain in effect. Medications administered per MD orders. R: No reactions/side effects to medicine noted. Pt denies any concerns at this time, and verbally contracts for safety. Pt ambulating on the unit with no issues. Pt remains safe on and off the unit.
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
## 2019-04-10 NOTE — Progress Notes (Signed)
Patient ID: Todd [name], male   DOB: 06/29/1996, 22 y.o.   MRN: [id number]  [place] NOVEL CORONAVIRUS (COVID-19) DAILY CHECK-OFF SYMPTOMS - answer yes or no to each - every day NO YES  Have you had a fever in the past 24 hours?  . Fever (Temp > 37.80C / 100F) X   Have you had any of these symptoms in the past 24 hours? . New Cough .  Sore Throat  .  Shortness of Breath .  Difficulty Breathing .  Unexplained Body Aches   X   Have you had any one of these symptoms in the past 24 hours not related to allergies?   . Runny Nose .  Nasal Congestion .  Sneezing   X   If you have had runny nose, nasal congestion, sneezing in the past 24 hours, has it worsened?  X   EXPOSURES - check yes or no X   Have you traveled outside the state in the past 14 days?  X   Have you been in contact with someone with a confirmed diagnosis of COVID-19 or PUI in the past 14 days without wearing appropriate PPE?  X   Have you been living in the same home as a person with confirmed diagnosis of COVID-19 or a PUI (household contact)?    X   Have you been diagnosed with COVID-19?    X              What to do next: Answered NO to all: Answered YES to anything:   Proceed with unit schedule Follow the [facility] Inpatient Flowsheet.   

## 2019-04-10 NOTE — Progress Notes (Signed)
[facility] MD Progress Note  04/10/2019 11:00 AM Todd [name] Todd [name]  MRN:  [id number] Subjective:   Patient continues to deny or minimize positive symptoms he is focused on discharge acknowledges that he did not show up for follow-up upon discharge last time.  Denies hearing things now states he was hearing and seeing things at home but cannot be more specific no EPS or TD denies thoughts of harming self or others compliant with meds here Principal Problem: Exacerbation and underlying psychotic disorder Diagnosis: Active Problems:   Bipolar disorder with psychotic features (HCC)   MDD (major depressive disorder), recurrent, severe, with psychosis (HCC)   Paranoid schizophrenia (HCC)  Total Time spent with patient: 20 minutes  Past Medical History:  Past Medical History:  Diagnosis Date  . Bipolar 1 disorder (HCC)    History reviewed. No pertinent surgical history. Family History:  Family History  Problem Relation Age of Onset  . Hyperlipidemia Father   . Diabetes Maternal Grandmother   . Alcohol abuse Paternal Grandfather     Social History:  Social History   Substance and Sexual Activity  Alcohol Use Yes  . Alcohol/week: 0.0 standard drinks   Comment: Beer and Tequila on weekends.  3 beers and 4 shots of Tequila over 2 weekends per month     Social History   Substance and Sexual Activity  Drug Use Yes  . Types: Marijuana   Comment: Marijuana:  starts and stops.  When smoking, daily.  Tested every 2 weeks due to taking prescription drug abuse--stopped in [place].  Goes throught 05/2016. Has used MJ since 6th grade.  Went to [employer] a lot daily.    Social History   Socioeconomic History  . Marital status: Single    Spouse name: Not on file  . Number of children: 0  . Years of education: Not on file  . Highest education level: Not on file  Occupational History  . 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 Financial resource  strain: Not on file  . Food insecurity:    Worry: Not on file    Inability: Not on file  . Transportation needs:    Medical: Not on file    Non-medical: Not on file  Tobacco Use  . Smoking status: Former Smoker    Packs/day: 0.00  . Smokeless tobacco: Never Used  Substance and Sexual Activity  . Alcohol use: Yes    Alcohol/week: 0.0 standard drinks    Comment: Beer and Tequila on weekends.  3 beers and 4 shots of Tequila over 2 weekends per month  . Drug use: Yes    Types: Marijuana    Comment: Marijuana:  starts and stops.  When smoking, daily.  Tested every 2 weeks due to taking prescription drug abuse--stopped in [place].  Goes throught 05/2016. Has used MJ since 6th grade.  Went to [employer] a lot daily.  Marland Kitchen Sexual activity: Yes    Partners: Female    Birth control/protection: Condom  Lifestyle  . Physical activity:    Days per week: Not on file    Minutes per session: Not on file  . Stress: Not on file  Relationships  . Social connections:    Talks on phone: Not on file    Gets together: Not on file    Attends religious service: Not on file    Active member of club or organization: Not on file    Attends meetings of clubs or organizations:  Not on file    Relationship status: Not on file  Other Topics Concern  . Not on file  Social History Narrative   Originally from [place]   Came to [employer]. When 23 years old.     Lives with parents and 45 yo sister.    Additional Social History:    Pain Medications: see  MAR Prescriptions: see MAR Over the Counter: see MAR History of alcohol / drug use?: Yes Longest period of sobriety (when/how long): family states that they do not believe that patient has been drinking at least for the past six months Negative Consequences of Use: Legal Name of Substance 1: alcohol 1 - Age of First Use: unknown 1 - Amount (size/oz): 3 beers and 2 shots  1 - Frequency: twice monthly 1 - Duration: unknown 1 - Last Use / Amount:  Unknown                  Sleep: Good  Appetite:  Good  Current Medications: Current Facility-Administered Medications  Medication Dose Route Frequency Provider Last Rate Last Dose  . alum & mag hydroxide-simeth (MAALOX/MYLANTA) 200-200-20 MG/5ML suspension 30 mL  30 mL Oral Q4H PRN Oneta Rack, NP      . benztropine (COGENTIN) tablet 0.5 mg  0.5 mg Oral BID Nira Conn A, NP   0.5 mg at 04/10/19 1610  . hydrOXYzine (ATARAX/VISTARIL) tablet 50 mg  50 mg Oral TID PRN Nira Conn A, NP      . magnesium hydroxide (MILK OF MAGNESIA) suspension 30 mL  30 mL Oral Daily PRN Oneta Rack, NP      . omega-3 acid ethyl esters (LOVAZA) capsule 1 g  1 g Oral BID Nira Conn A, NP   1 g at 04/10/19 0817  . prenatal multivitamin tablet 1 tablet  1 tablet Oral Q1200 Nira Conn A, NP   1 tablet at 04/09/19 1106  . risperiDONE (RISPERDAL) tablet 3 mg  3 mg Oral Daily Nira Conn A, NP   3 mg at 04/10/19 0817  . risperiDONE (RISPERDAL) tablet 6 mg  6 mg Oral QHS Nira Conn A, NP   6 mg at 04/09/19 2059  . temazepam (RESTORIL) capsule 30 mg  30 mg Oral QHS Malvin Johns, MD   30 mg at 04/09/19 2058    Lab Results: No results found for this or any previous visit (from the past 48 hour(s)).  Blood Alcohol level:  Lab Results  Component Value Date   ETH <10 03/18/2019   ETH <10 04/05/2018    Metabolic Disorder Labs: Lab Results  Component Value Date   HGBA1C 5.4 06/01/2017   MPG 108 06/01/2017   Lab Results  Component Value Date   PROLACTIN 31.5 (H) 06/01/2017   PROLACTIN 34.6 (H) 03/17/2016   Lab Results  Component Value Date   CHOL 182 06/01/2017   TRIG 75 06/01/2017   HDL 36 (L) 06/01/2017   CHOLHDL 5.1 06/01/2017   VLDL 15 06/01/2017   LDLCALC 131 (H) 06/01/2017   LDLCALC 106 03/01/2016    Physical Findings: AIMS: Facial and Oral Movements Muscles of Facial Expression: None, normal Lips and Perioral Area: None, normal Jaw: None, normal Tongue: None,  normal,Extremity Movements Upper (arms, wrists, hands, fingers): None, normal Lower (legs, knees, ankles, toes): None, normal, Trunk Movements Neck, shoulders, hips: None, normal, Overall Severity Severity of abnormal movements (highest score from questions above): None, normal Incapacitation due to abnormal movements: None, normal Patient's awareness of abnormal movements (  rate only patient's report): No Awareness, Dental Status Current problems with teeth and/or dentures?: No Does patient usually wear dentures?: No  CIWA:  CIWA-Ar Total: 0 COWS:     Musculoskeletal: Strength & Muscle Tone: within normal limits Gait & Station: normal Patient leans: N/A  Psychiatric Specialty Exam: Physical Exam  ROS  Blood pressure 104/62, pulse 80, temperature 98.1 F (36.7 C), temperature source Oral, resp. rate 17, height 5\' 7"  (1.702 m), weight 68.5 kg, SpO2 100 %.Body mass index is 23.65 kg/m.  General Appearance: Casual and Disheveled  Eye Contact:  Good  Speech:  Slow  Volume:  Decreased  Mood:  Anxious and Dysphoric  Affect:  Blunt  Thought Process:  Irrelevant and Descriptions of Associations: Loose  Orientation:  Full (Time, Place, and Person)  Thought Content:  Illogical, Delusions and Tangential  Suicidal Thoughts:  No  Homicidal Thoughts:  No  Memory:  Immediate;   Poor  Judgement:  Fair  Insight:  Fair  Psychomotor Activity:  Decreased  Concentration:  Concentration: Good  Recall:  Good  Fund of Knowledge:  Good  Language:  Good  Akathisia:  Negative  Handed:  Right  AIMS (if indicated):     Assets:  Intimacy Leisure Time Physical Health  ADL's:  Intact  Cognition:  WNL  Sleep:  Number of Hours: 6.75     Treatment Plan Summary: Daily contact with patient to assess and evaluate symptoms and progress in treatment, Medication management and Plan Continue antipsychotic therapy long-acting injectable before discharge also continue reality based therapies in  groups  Suheyb Raucci, MD 04/10/2019, 11:00 AM

## 2019-04-10 NOTE — Progress Notes (Signed)
Recreation Therapy Notes  Date: 4.24.20 Time: 1000 Location: 500 Hall Dayroom  Group Topic: Self-Esteem  Goal Area(s) Addresses:  Patient will successfully identify positive attributes about themselves.  Patient will successfully identify benefit of improved self-esteem.   Intervention: [employer], Paper  Activity: Personalized Plates.  Patients were to use supplies offered to create a personalized license plate that highlighted some of things that make them unique.  Education:  Self-Esteem, Building control surveyor.   Education Outcome: Acknowledges education/In group clarification offered/Needs additional education  Clinical Observations/Feedback:  Pt did not attend group.    [name], LRT/CTRS         [name] A 04/10/2019 10:54 AM

## 2019-04-10 NOTE — Progress Notes (Addendum)
SPIRITUAL CARE GROUP    Spiritual care group facilitated by chaplain Burnis Kingfisher, MDiv, [facility]    Group focused on topic of Hope.  Pts engaged in facilitated dialog around meaning of hope.  Participated in visual explorer exercise to define hope for themselves today.    Pt was invited.  Did not attend.
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
## 2019-04-10 NOTE — Progress Notes (Signed)
D: Pt denies SI/HI/AVH. Pt is pleasant and cooperative. Pt stated he was doing ok. Pt continued to be paranoid.  A: Pt was offered support and encouragement. Pt was given scheduled medications. Pt was encourage to attend groups. Q 15 minute checks were done for safety.  R: safety maintained on unit.

## 2019-04-11 LAB — RAPID URINE DRUG SCREEN, HOSP PERFORMED
Amphetamines: NOT DETECTED
Barbiturates: NOT DETECTED
Benzodiazepines: POSITIVE — AB
Cocaine: NOT DETECTED
Opiates: NOT DETECTED
Tetrahydrocannabinol: NOT DETECTED

## 2019-04-11 NOTE — Plan of Care (Signed)
  Problem: Activity: Goal: Interest or engagement in activities will improve Outcome: Progressing   D: Pt alert and oriented on the unit. Pt denies SI/HI, A/VH. Pt attended groups today and is pleasant and cooperative on the unit. [name] goal for the day is "getting better." A: Education, support and encouragement provided, q15 minute safety checks remain in effect. Medications administered per MD orders. R: No reactions/side effects to medicine noted. Pt denies any concerns at this time, and verbally contracts for safety. Pt ambulating on the unit with no issues. Pt remains safe on and off the unit.

## 2019-04-11 NOTE — Progress Notes (Signed)
D: Pt denies SI/HI/AVH. Pt is pleasant and cooperative. Pt continues to be paranoid on the unit A: Pt was offered support and encouragement. Pt was given scheduled medications. Pt was encourage to attend groups. Q 15 minute checks were done for safety.  R:Pt attends groups and interacts well with peers and staff. Pt is taking medication. Pt has no complaints.Pt receptive to treatment and safety maintained on unit.  Problem: Education: Goal: Emotional status will improve Outcome: Progressing   Problem: Education: Goal: Mental status will improve Outcome: Progressing

## 2019-04-11 NOTE — Progress Notes (Signed)
Patient ID: Todd [name], male   DOB: 26-Oct-1996, 23 y.o.   MRN: [id number]  [place] NOVEL CORONAVIRUS (COVID-19) DAILY CHECK-OFF SYMPTOMS - answer yes or no to each - every day NO YES  Have you had a fever in the past 24 hours?  . Fever (Temp > 37.80C / 100F) X   Have you had any of these symptoms in the past 24 hours? . New Cough .  Sore Throat  .  Shortness of Breath .  Difficulty Breathing .  Unexplained Body Aches   X   Have you had any one of these symptoms in the past 24 hours not related to allergies?   . Runny Nose .  Nasal Congestion .  Sneezing   X   If you have had runny nose, nasal congestion, sneezing in the past 24 hours, has it worsened?  X   EXPOSURES - check yes or no X   Have you traveled outside the state in the past 14 days?  X   Have you been in contact with someone with a confirmed diagnosis of COVID-19 or PUI in the past 14 days without wearing appropriate PPE?  X   Have you been living in the same home as a person with confirmed diagnosis of COVID-19 or a PUI (household contact)?    X   Have you been diagnosed with COVID-19?    X              What to do next: Answered NO to all: Answered YES to anything:   Proceed with unit schedule Follow the [facility] Inpatient Flowsheet.

## 2019-04-11 NOTE — Progress Notes (Signed)
D: Patient observed in dayroom watching tv this evening. Does not interact with peers, staff. Eye contact brief. Patient guarded. Patient's affect flat, anxious and mood is congruent. Patient initially denies AVH however goes on to endorse AH "off and on." Does not describe what the voices say but does deny they are command in nature. Denies pain, physical complaints. COVID-19 screen negative, afebrile. Respiratory assessment WDL.  A: Medicated per orders, no prns requested or needed. Mouth check performed to ensure patient did not cheek meds. Medication education provided. Level III obs in place for safety. Emotional support offered. Encouraged to attend and participate in unit programming.  R: Patient verbalizes understanding of POC, falls prevention education.  Patient denies SI/HI/VH and remains safe on level III obs. Will continue to monitor throughout the night.

## 2019-04-11 NOTE — Progress Notes (Signed)
[facility] MD Progress Note  04/11/2019 10:25 AM Todd [name]  MRN:  [id number] Subjective:  23 yo patient is known to have a history of a schizoaffective disorder, bipolar type with psychosis. He required petition for involuntary commitment due to aggressiveness, cluster behaviors driven by his psychotic symptoms, med noncompliance.  Swinging a knife in the ear to get rid of demons reportedly and is not been compliant with his Risperdal.  Patient is seen and examined.  Patient is a 23 year old male with a known past psychiatric history significant for schizoaffective disorder; bipolar type with psychosis.  He is seen and examined.  Patient denied any auditory or visual hallucinations.  He is rather odd in his behavior.  We go through the interview, and then he states "can we start the interview over again from the beginning".  Nursing notes reflect that he has been guarded, and his affect is flat.  He has been compliant with his medications.  His blood pressure is stable at 100/66, and he is afebrile, but he is tachycardic with a rate of 117.  His EKG from 4/1 showed a sinus tachycardia.  His admission laboratories were all essentially negative.  He denied any suicidal or homicidal ideation currently.  He slept 6.5 hours last night.  Principal Problem: <principal problem not specified> Diagnosis: Active Problems:   Bipolar disorder with psychotic features (HCC)   MDD (major depressive disorder), recurrent, severe, with psychosis (HCC)   Paranoid schizophrenia (HCC)  Total Time spent with patient: 15 minutes  Past Psychiatric History: See admission H&P  Past Medical History:  Past Medical History:  Diagnosis Date  . Bipolar 1 disorder (HCC)    History reviewed. No pertinent surgical history. Family History:  Family History  Problem Relation Age of Onset  . Hyperlipidemia Father   . Diabetes Maternal Grandmother   . Alcohol abuse Paternal Grandfather    Family Psychiatric   History: See admission H&P Social History:  Social History   Substance and Sexual Activity  Alcohol Use Yes  . Alcohol/week: 0.0 standard drinks   Comment: Beer and Tequila on weekends.  3 beers and 4 shots of Tequila over 2 weekends per month     Social History   Substance and Sexual Activity  Drug Use Yes  . Types: Marijuana   Comment: Marijuana:  starts and stops.  When smoking, daily.  Tested every 2 weeks due to taking prescription drug abuse--stopped in [place].  Goes throught 05/2016. Has used MJ since 6th grade.  Went to [employer] a lot daily.    Social History   Socioeconomic History  . Marital status: Single    Spouse name: Not on file  . Number of children: 0  . Years of education: Not on file  . Highest education level: Not on file  Occupational History  . 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 Financial resource strain: Not on file  . Food insecurity:    Worry: Not on file    Inability: Not on file  . Transportation needs:    Medical: Not on file    Non-medical: Not on file  Tobacco Use  . Smoking status: Former Smoker    Packs/day: 0.00  . Smokeless tobacco: Never Used  Substance and Sexual Activity  . Alcohol use: Yes    Alcohol/week: 0.0 standard drinks    Comment: Beer and Tequila on weekends.  3 beers and 4 shots of Tequila over 2 weekends per month  .  Drug use: Yes    Types: Marijuana    Comment: Marijuana:  starts and stops.  When smoking, daily.  Tested every 2 weeks due to taking prescription drug abuse--stopped in [place].  Goes throught 05/2016. Has used MJ since 6th grade.  Went to [employer] a lot daily.  Marland Kitchen Sexual activity: Yes    Partners: Female    Birth control/protection: Condom  Lifestyle  . Physical activity:    Days per week: Not on file    Minutes per session: Not on file  . Stress: Not on file  Relationships  . Social connections:    Talks on phone: Not on file    Gets together:  Not on file    Attends religious service: Not on file    Active member of club or organization: Not on file    Attends meetings of clubs or organizations: Not on file    Relationship status: Not on file  Other Topics Concern  . Not on file  Social History Narrative   Originally from [place]   Came to [employer]. When 23 years old.     Lives with parents and 22 yo sister.    Additional Social History:    Pain Medications: see  MAR Prescriptions: see MAR Over the Counter: see MAR History of alcohol / drug use?: Yes Longest period of sobriety (when/how long): family states that they do not believe that patient has been drinking at least for the past six months Negative Consequences of Use: Legal Name of Substance 1: alcohol 1 - Age of First Use: unknown 1 - Amount (size/oz): 3 beers and 2 shots  1 - Frequency: twice monthly 1 - Duration: unknown 1 - Last Use / Amount: Unknown                  Sleep: Good  Appetite:  Good  Current Medications: Current Facility-Administered Medications  Medication Dose Route Frequency Provider Last Rate Last Dose  . alum & mag hydroxide-simeth (MAALOX/MYLANTA) 200-200-20 MG/5ML suspension 30 mL  30 mL Oral Q4H PRN Oneta Rack, NP      . benztropine (COGENTIN) tablet 0.5 mg  0.5 mg Oral BID Nira Conn A, NP   0.5 mg at 04/11/19 0831  . hydrOXYzine (ATARAX/VISTARIL) tablet 50 mg  50 mg Oral TID PRN Nira Conn A, NP      . magnesium hydroxide (MILK OF MAGNESIA) suspension 30 mL  30 mL Oral Daily PRN Oneta Rack, NP      . omega-3 acid ethyl esters (LOVAZA) capsule 1 g  1 g Oral BID Nira Conn A, NP   1 g at 04/11/19 0831  . prenatal multivitamin tablet 1 tablet  1 tablet Oral Q1200 Nira Conn A, NP   1 tablet at 04/10/19 1303  . risperiDONE (RISPERDAL) tablet 3 mg  3 mg Oral Daily Nira Conn A, NP   3 mg at 04/11/19 0831  . risperiDONE (RISPERDAL) tablet 6 mg  6 mg Oral QHS Nira Conn A, NP   6 mg at 04/10/19 2105  . temazepam  (RESTORIL) capsule 30 mg  30 mg Oral QHS Malvin Johns, MD   30 mg at 04/10/19 2105    Lab Results: No results found for this or any previous visit (from the past 48 hour(s)).  Blood Alcohol level:  Lab Results  Component Value Date   [facility] <10 03/18/2019   ETH <10 04/05/2018    Metabolic Disorder Labs: Lab Results  Component  Value Date   HGBA1C 5.4 06/01/2017   MPG 108 06/01/2017   Lab Results  Component Value Date   PROLACTIN 31.5 (H) 06/01/2017   PROLACTIN 34.6 (H) 03/17/2016   Lab Results  Component Value Date   CHOL 182 06/01/2017   TRIG 75 06/01/2017   HDL 36 (L) 06/01/2017   CHOLHDL 5.1 06/01/2017   VLDL 15 06/01/2017   LDLCALC 131 (H) 06/01/2017   LDLCALC 106 03/01/2016    Physical Findings: AIMS: Facial and Oral Movements Muscles of Facial Expression: None, normal Lips and Perioral Area: None, normal Jaw: None, normal Tongue: None, normal,Extremity Movements Upper (arms, wrists, hands, fingers): None, normal Lower (legs, knees, ankles, toes): None, normal, Trunk Movements Neck, shoulders, hips: None, normal, Overall Severity Severity of abnormal movements (highest score from questions above): None, normal Incapacitation due to abnormal movements: None, normal Patient's awareness of abnormal movements (rate only patient's report): No Awareness, Dental Status Current problems with teeth and/or dentures?: No Does patient usually wear dentures?: No  CIWA:  CIWA-Ar Total: 0 COWS:     Musculoskeletal: Strength & Muscle Tone: within normal limits Gait & Station: normal Patient leans: N/A  Psychiatric Specialty Exam: Physical Exam  Nursing note and vitals reviewed. Constitutional: He is oriented to person, place, and time. He appears well-developed and well-nourished.  HENT:  Head: Normocephalic and atraumatic.  Respiratory: Effort normal.  Neurological: He is alert and oriented to person, place, and time.    ROS  Blood pressure 100/66, pulse (!) 117,  temperature 98 F (36.7 C), temperature source Oral, resp. rate 17, height 5\' 7"  (1.702 m), weight 68.5 kg, SpO2 100 %.Body mass index is 23.65 kg/m.  General Appearance: Disheveled  Eye Contact:  Fair  Speech:  Normal Rate  Volume:  Normal  Mood:  Dysphoric  Affect:  Flat  Thought Process:  Coherent and Descriptions of Associations: Circumstantial  Orientation:  Full (Time, Place, and Person)  Thought Content:  Negative  Suicidal Thoughts:  No  Homicidal Thoughts:  No  Memory:  Immediate;   Fair Recent;   Fair Remote;   Fair  Judgement:  Intact  Insight:  Lacking  Psychomotor Activity:  Psychomotor Retardation  Concentration:  Concentration: Fair and Attention Span: Fair  Recall:  FiservFair  Fund of Knowledge:  Fair  Language:  Fair  Akathisia:  Negative  Handed:  Right  AIMS (if indicated):     Assets:  Desire for Improvement Resilience  ADL's:  Intact  Cognition:  WNL  Sleep:  Number of Hours: 6.5     Treatment Plan Summary: Daily contact with patient to assess and evaluate symptoms and progress in treatment, Medication management and Plan : Patient is seen and examined.  Patient is a 23 year old male with the above-stated past psychiatric history who is seen in follow-up.   Diagnosis: #1 schizoaffective disorder; bipolar type, #2 tachycardia  Patient appears to be slowly improving.  He denied any auditory or visual hallucinations today.  He has been guarded at times.  His behavior is odd in nature, but he did deny suicidal or homicidal ideation.  He continues on Cogentin, omega-3 fatty acids and Risperdal.  No change in his medications at this point. 1.  Continue Cogentin 0.5 mg p.o. twice daily for side effects of antipsychotic medications. 2.  Continue omega-3 fatty acids 1 g p.o. twice daily for neuro protection. 4.  Continue Risperdal 3 mg p.o. daily and 6 mg p.o. nightly for psychosis. 5.  Continue temazepam 30 mg p.o.  nightly for sleep. 6.  Disposition planning-in  progress.  Antonieta Pert, MD 04/11/2019, 10:25 AM

## 2019-04-11 NOTE — [facility] Group Notes (Signed)
  [facility] LCSW Group Therapy Note  Date/Time:  04/11/2019 11:15AM-12:00PM  Type of Therapy and Topic:  Group Therapy:  Feelings About Hospitalization  Participation Level:  Minimal   Description of Group This process group involved patients discussing their feelings related to being hospitalized, as well as the benefits they see to being in the hospital.  These feelings and benefits were itemized.  The group then brainstormed specific ways in which they could seek those same benefits when they discharge and return home.  Therapeutic Goals 1. Patient will identify and describe positive and negative feelings related to hospitalization 2. Patient will verbalize benefits of hospitalization to themselves personally 3. Patients will brainstorm together ways they can obtain similar benefits in the outpatient setting, identify barriers to wellness and possible solutions  Summary of Patient Progress:  The patient expressed his primary feelings about being hospitalized are "I don't feel good about it.  To stay well, he said he needs to engage "in better behaviors" and "not raise my voice to [place] mother and father."  He was late to group then left again, was present for a minimal amount of time.  Therapeutic Modalities Cognitive Behavioral Therapy Motivational Interviewing    Ambrose Mantle, LCSW 04/11/2019, 1:26 PM

## 2019-04-12 MED ORDER — TEMAZEPAM 15 MG PO CAPS
15.0000 mg | ORAL_CAPSULE | Freq: Every day | ORAL | Status: DC
  Administered 2019-04-12 – 2019-04-13 (×2): 15 mg via ORAL
  Filled 2019-04-12 (×2): qty 1

## 2019-04-12 NOTE — Progress Notes (Signed)
D: Pt denies SI/HI/AVH. Pt is pleasant and cooperative. Pt visible on the unit at times. Pt sat in dayroom for some this evening. Pt continues to present paranoid and guarded.  A: Pt was offered support and encouragement. Pt was given scheduled medications. Pt was encourage to attend groups. Q 15 minute checks were done for safety.  R:Pt attends groups and interacts well with peers and staff. Pt is taking medication. Pt has no complaints.Pt receptive to treatment and safety maintained on unit.  Problem: Education: Goal: Emotional status will improve Outcome: Progressing   Problem: Activity: Goal: Interest or engagement in activities will improve Outcome: Progressing   Problem: Activity: Goal: Sleeping patterns will improve Outcome: Progressing   Problem: Coping: Goal: Ability to demonstrate self-control will improve Outcome: Progressing

## 2019-04-12 NOTE — Progress Notes (Signed)
D: Todd [name] did not report complaints today. He denied SI, HI, and AVH. He was pleasant and cooperative. He was med compliant. He napped at intervals. He rated his depression, anxiety, and feelings of hopelessness all at one out of ten, with ten being the worst. He reported good sleep, good appetite, normal energy level, and good concentration.   A: Meds given as ordered. Q15 safety checks maintained. Support/encouragement offered.  R: Pt remains free from harm and continues with treatment. Will continue to monitor for needs/safety.

## 2019-04-12 NOTE — Progress Notes (Signed)
Adult Psychoeducational Group Note  Date:  04/12/2019 Time:  12:27 AM  Group Topic/Focus:  Wrap-Up Group:   The focus of this group is to help patients review their daily goal of treatment and discuss progress on daily workbooks.  Participation Level:  Did Not Attend  Participation Quality:  Did not attend  Affect:  Did not attend  Cognitive:  Did not attend  Insight: None  Engagement in Group:  Did not attend  Modes of Intervention:  Did not attend  Additional Comments:  Pt did not attend evening wrap up group tonight.  Felipa Furnace 04/12/2019, 12:27 AM

## 2019-04-12 NOTE — Progress Notes (Signed)
Adult Psychoeducational Group Note  Date:  04/12/2019 Time:  9:50 PM  Group Topic/Focus:  Wrap-Up Group:   The focus of this group is to help patients review their daily goal of treatment and discuss progress on daily workbooks.  Participation Level:  Active  Participation Quality:  Appropriate  Affect:  Appropriate  Cognitive:  Appropriate  Insight: Appropriate  Engagement in Group:  Engaged  Modes of Intervention:  Discussion  Additional Comments:  Pt stated his goal for today was to work on his nutrition. Pt stated he accomplished his goal today by attend and eating at all meals scheduled today. Pt rated his over all day a 9 out of 10. Pt stated he did not attend any groups today but planned to attend them all tomorrow. Pt stated talking to his doctor and receiving his Monday discharged date help improve his day.  Felipa Furnace 04/12/2019, 9:50 PM

## 2019-04-12 NOTE — [facility] Group Notes (Signed)
[facility] LCSW Group Therapy Note  Date/Time:  04/12/2019  11:00AM-12:00PM  Type of Therapy and Topic:  Group Therapy:  Music and Mood  Participation Level:  Did Not Attend   Description of Group: In this process group, members listened to a variety of genres of music and identified that different types of music evoke different responses.  Patients were encouraged to identify music that was soothing for them and music that was energizing for them.  Patients discussed how this knowledge can help with wellness and recovery in various ways including managing depression and anxiety as well as encouraging healthy sleep habits.    Therapeutic Goals: 1. Patients will explore the impact of different varieties of music on mood 2. Patients will verbalize the thoughts they have when listening to different types of music 3. Patients will identify music that is soothing to them as well as music that is energizing to them 4. Patients will discuss how to use this knowledge to assist in maintaining wellness and recovery 5. Patients will explore the use of music as a coping skill  Summary of Patient Progress:  N/A - did not attend  Therapeutic Modalities: Solution Focused Brief Therapy Activity   Ambrose Mantle, LCSW

## 2019-04-12 NOTE — Progress Notes (Signed)
[facility] MD Progress Note  04/12/2019 10:04 AM Todd [name]  MRN:  [id number] Subjective:  22 yopatient is known to have a history of a schizoaffective disorder, bipolar type with psychosis. He required petition for involuntary commitment due to aggressiveness, cluster behaviors driven by his psychotic symptoms, med noncompliance. Swinging a knife in the ear to get rid of demons reportedly and is not been compliant with his Risperdal.  Objective: Patient is seen and examined.  Patient is a 23 year old male with a known past psychiatric history significant for schizoaffective disorder; bipolar type with psychosis.  He is seen and examined.  He denied any auditory or visual hallucinations.  He still is slightly odd, but is not pressured or tangential.  He is curious as to when he is going to be able to go home, and we discussed the fact that he would return to his home.  He is interested in getting some honey with his meals.  Review of the nursing notes show that he was appropriate most of the day yesterday.  His blood pressure is stable, he is afebrile.  He is mildly tachycardic at a rate of 117.  His last EKG was on 4/1.  He was noted that be in sinus tach.  He slept 6.25 hours last night.  Principal Problem: <principal problem not specified> Diagnosis: Active Problems:   Bipolar disorder with psychotic features (HCC)   MDD (major depressive disorder), recurrent, severe, with psychosis (HCC)   Paranoid schizophrenia (HCC)  Total Time spent with patient: 15 minutes  Past Psychiatric History: See admission H&P  Past Medical History:  Past Medical History:  Diagnosis Date  . Bipolar 1 disorder (HCC)    History reviewed. No pertinent surgical history. Family History:  Family History  Problem Relation Age of Onset  . Hyperlipidemia Father   . Diabetes Maternal Grandmother   . Alcohol abuse Paternal Grandfather    Family Psychiatric  History: See admission H&P Social History:   Social History   Substance and Sexual Activity  Alcohol Use Yes  . Alcohol/week: 0.0 standard drinks   Comment: Beer and Tequila on weekends.  3 beers and 4 shots of Tequila over 2 weekends per month     Social History   Substance and Sexual Activity  Drug Use Yes  . Types: Marijuana   Comment: Marijuana:  starts and stops.  When smoking, daily.  Tested every 2 weeks due to taking prescription drug abuse--stopped in [place].  Goes throught 05/2016. Has used MJ since 6th grade.  Went to [employer] a lot daily.    Social History   Socioeconomic History  . Marital status: Single    Spouse name: Not on file  . Number of children: 0  . Years of education: Not on file  . Highest education level: Not on file  Occupational History  . 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 Financial resource strain: Not on file  . Food insecurity:    Worry: Not on file    Inability: Not on file  . Transportation needs:    Medical: Not on file    Non-medical: Not on file  Tobacco Use  . Smoking status: Former Smoker    Packs/day: 0.00  . Smokeless tobacco: Never Used  Substance and Sexual Activity  . Alcohol use: Yes    Alcohol/week: 0.0 standard drinks    Comment: Beer and Tequila on weekends.  3 beers and 4 shots of Tequila over 2 weekends  per month  . Drug use: Yes    Types: Marijuana    Comment: Marijuana:  starts and stops.  When smoking, daily.  Tested every 2 weeks due to taking prescription drug abuse--stopped in [place].  Goes throught 05/2016. Has used MJ since 6th grade.  Went to [employer] a lot daily.  Marland Kitchen Sexual activity: Yes    Partners: Female    Birth control/protection: Condom  Lifestyle  . Physical activity:    Days per week: Not on file    Minutes per session: Not on file  . Stress: Not on file  Relationships  . Social connections:    Talks on phone: Not on file    Gets together: Not on file    Attends religious service:  Not on file    Active member of club or organization: Not on file    Attends meetings of clubs or organizations: Not on file    Relationship status: Not on file  Other Topics Concern  . Not on file  Social History Narrative   Originally from [place]   Came to [employer]. When 23 years old.     Lives with parents and 29 yo sister.    Additional Social History:    Pain Medications: see  MAR Prescriptions: see MAR Over the Counter: see MAR History of alcohol / drug use?: Yes Longest period of sobriety (when/how long): family states that they do not believe that patient has been drinking at least for the past six months Negative Consequences of Use: Legal Name of Substance 1: alcohol 1 - Age of First Use: unknown 1 - Amount (size/oz): 3 beers and 2 shots  1 - Frequency: twice monthly 1 - Duration: unknown 1 - Last Use / Amount: Unknown                  Sleep: Good  Appetite:  Good  Current Medications: Current Facility-Administered Medications  Medication Dose Route Frequency Provider Last Rate Last Dose  . alum & mag hydroxide-simeth (MAALOX/MYLANTA) 200-200-20 MG/5ML suspension 30 mL  30 mL Oral Q4H PRN Oneta Rack, NP      . benztropine (COGENTIN) tablet 0.5 mg  0.5 mg Oral BID Nira Conn A, NP   0.5 mg at 04/11/19 1713  . hydrOXYzine (ATARAX/VISTARIL) tablet 50 mg  50 mg Oral TID PRN Nira Conn A, NP      . magnesium hydroxide (MILK OF MAGNESIA) suspension 30 mL  30 mL Oral Daily PRN Oneta Rack, NP      . omega-3 acid ethyl esters (LOVAZA) capsule 1 g  1 g Oral BID Nira Conn A, NP   1 g at 04/11/19 1713  . prenatal multivitamin tablet 1 tablet  1 tablet Oral Q1200 Nira Conn A, NP   1 tablet at 04/11/19 1151  . risperiDONE (RISPERDAL) tablet 3 mg  3 mg Oral Daily Nira Conn A, NP   3 mg at 04/11/19 0831  . risperiDONE (RISPERDAL) tablet 6 mg  6 mg Oral QHS Nira Conn A, NP   6 mg at 04/11/19 2117  . temazepam (RESTORIL) capsule 30 mg  30 mg Oral QHS  Malvin Johns, MD   30 mg at 04/11/19 2117    Lab Results: No results found for this or any previous visit (from the past 48 hour(s)).  Blood Alcohol level:  Lab Results  Component Value Date   ETH <10 03/18/2019   ETH <10 04/05/2018    Metabolic Disorder Labs:  Lab Results  Component Value Date   HGBA1C 5.4 06/01/2017   MPG 108 06/01/2017   Lab Results  Component Value Date   PROLACTIN 31.5 (H) 06/01/2017   PROLACTIN 34.6 (H) 03/17/2016   Lab Results  Component Value Date   CHOL 182 06/01/2017   TRIG 75 06/01/2017   HDL 36 (L) 06/01/2017   CHOLHDL 5.1 06/01/2017   VLDL 15 06/01/2017   LDLCALC 131 (H) 06/01/2017   LDLCALC 106 03/01/2016    Physical Findings: AIMS: Facial and Oral Movements Muscles of Facial Expression: None, normal Lips and Perioral Area: None, normal Jaw: None, normal Tongue: None, normal,Extremity Movements Upper (arms, wrists, hands, fingers): None, normal Lower (legs, knees, ankles, toes): None, normal, Trunk Movements Neck, shoulders, hips: None, normal, Overall Severity Severity of abnormal movements (highest score from questions above): None, normal Incapacitation due to abnormal movements: None, normal Patient's awareness of abnormal movements (rate only patient's report): No Awareness, Dental Status Current problems with teeth and/or dentures?: No Does patient usually wear dentures?: No  CIWA:  CIWA-Ar Total: 0 COWS:     Musculoskeletal: Strength & Muscle Tone: within normal limits Gait & Station: normal Patient leans: N/A  Psychiatric Specialty Exam: Physical Exam  Nursing note and vitals reviewed. Constitutional: He is oriented to person, place, and time. He appears well-developed and well-nourished.  HENT:  Head: Normocephalic and atraumatic.  Respiratory: Effort normal.  Neurological: He is alert and oriented to person, place, and time.    ROS  Blood pressure 100/66, pulse (!) 117, temperature 98 F (36.7 C), temperature  source Oral, resp. rate 17, height  (1.702 m), weight 68.5 kg, SpO2 100 %.Body mass index is 23.65 kg/m.  General Appearance: Disheveled  Eye Contact:  Fair  Speech:  Normal Rate  Volume:  Decreased  Mood:  Euthymic  Affect:  Constricted  Thought Process:  Goal Directed and Descriptions of Associations: Intact  Orientation:  Full (Time, Place, and Person)  Thought Content:  Logical  Suicidal Thoughts:  No  Homicidal Thoughts:  No  Memory:  Immediate;   Fair Recent;   Fair Remote;   Fair  Judgement:  Fair  Insight:  Fair  Psychomotor Activity:  Decreased  Concentration:  Concentration: Fair and Attention Span: Fair  Recall:  [employer] of Knowledge:  Fair  Language:  Fair  Akathisia:  Negative  Handed:  Right  AIMS (if indicated):     Assets:  Desire for Improvement Housing Resilience  ADL's:  Intact  Cognition:  WNL  Sleep:  Number of Hours: 6.25     Treatment Plan Summary: Daily contact with patient to assess and evaluate symptoms and progress in treatment, Medication management and Plan : Patient is seen and examined.  Patient is a 24 year old male with the above-stated past psychiatric history who is seen in follow-up.   Diagnosis: #1 schizoaffective disorder; bipolar type, #2 tachycardia  Patient appears to be slowly improving still.  No evidence of any auditory or visual hallucinations.  He has been guarded, but no gross paranoia.  His behavior is odd, but he actually seems a little bit more put together today.  He continues on Risperdal, Cogentin, omega-3 fatty acids.  No change in his current medications.  I am going to get a repeat EKG given his recurrent tachycardia. 1.  Continue Cogentin 0.5 mg p.o. twice daily for side effects of antipsychotic medications. 2.  Continue omega-3 fatty acids 1 g p.o. twice daily for neuro protection. 3.  Continue  Risperdal 3 mg p.o. daily and 6 mg p.o. nightly for psychosis. 4.  Reduce temazepam to 15 mg p.o. nightly for  sleep. 5.  EKG today for tachycardia. 6.  Discharge planning-in progress.  Antonieta PertGreg Lawson Annet Manukyan, MD 04/12/2019, 10:04 AM

## 2019-04-12 NOTE — Progress Notes (Signed)
[facility] NOVEL CORONAVIRUS (COVID-19) DAILY CHECK-OFF SYMPTOMS - answer yes or no to each - every day NO YES  Have you had a fever in the past 24 hours?  . Fever (Temp > 37.80C / 100F) X   Have you had any of these symptoms in the past 24 hours? . New Cough .  Sore Throat  .  Shortness of Breath .  Difficulty Breathing .  Unexplained Body Aches   X   Have you had any one of these symptoms in the past 24 hours not related to allergies?   . Runny Nose .  Nasal Congestion .  Sneezing   X   If you have had runny nose, nasal congestion, sneezing in the past 24 hours, has it worsened?  X   EXPOSURES - check yes or no X   Have you traveled outside the state in the past 14 days?  X   Have you been in contact with someone with a confirmed diagnosis of COVID-19 or PUI in the past 14 days without wearing appropriate PPE?  X   Have you been living in the same home as a person with confirmed diagnosis of COVID-19 or a PUI (household contact)?    X   Have you been diagnosed with COVID-19?    X              What to do next: Answered NO to all: Answered YES to anything:   Proceed with unit schedule Follow the [facility] Inpatient Flowsheet.   

## 2019-04-13 NOTE — Tx Team (Signed)
Interdisciplinary Treatment and Diagnostic Plan Update  04/13/2019 Time of Session: 0846 Todd [name] MRN: [id number]  Principal Diagnosis: <principal problem not specified>  Secondary Diagnoses: Active Problems:   Bipolar disorder with psychotic features (HCC)   MDD (major depressive disorder), recurrent, severe, with psychosis (HCC)   Paranoid schizophrenia (HCC)   Current Medications:  Current Facility-Administered Medications  Medication Dose Route Frequency Provider Last Rate Last Dose  . alum & mag hydroxide-simeth (MAALOX/MYLANTA) 200-200-20 MG/5ML suspension 30 mL  30 mL Oral Q4H PRN Oneta RackLewis, Tanika N, NP      . benztropine (COGENTIN) tablet 0.5 mg  0.5 mg Oral BID Nira ConnBerry, Jason A, NP   0.5 mg at 04/13/19 [id number]  . hydrOXYzine (ATARAX/VISTARIL) tablet 50 mg  50 mg Oral TID PRN Nira ConnBerry, Jason A, NP      . magnesium hydroxide (MILK OF MAGNESIA) suspension 30 mL  30 mL Oral Daily PRN Oneta RackLewis, Tanika N, NP      . omega-3 acid ethyl esters (LOVAZA) capsule 1 g  1 g Oral BID Nira ConnBerry, Jason A, NP   1 g at 04/13/19 0911  . prenatal multivitamin tablet 1 tablet  1 tablet Oral Q1200 Nira ConnBerry, Jason A, NP   1 tablet at 04/12/19 1217  . risperiDONE (RISPERDAL) tablet 3 mg  3 mg Oral Daily Nira ConnBerry, Jason A, NP   3 mg at 04/13/19 0912  . risperiDONE (RISPERDAL) tablet 6 mg  6 mg Oral QHS Nira ConnBerry, Jason A, NP   6 mg at 04/12/19 2056  . temazepam (RESTORIL) capsule 15 mg  15 mg Oral QHS Antonieta Pertlary, Greg Lawson, MD   15 mg at 04/12/19 2056   PTA Medications: Medications Prior to Admission  Medication Sig Dispense Refill Last Dose  . [START ON 04/21/2019] ARIPiprazole ER (ABILIFY MAINTENA) 400 MG SRER injection Inject 2 mLs (400 mg total) into the muscle every 28 (twenty-eight) days. Due 5/6 1 each 11 Past Month at Unknown time  . benztropine (COGENTIN) 0.5 MG tablet Take 1 tablet (0.5 mg total) by mouth 2 (two) times daily. 60 tablet 2 Past Month at Unknown time  . risperiDONE (RISPERDAL) 3 MG tablet  Take 1 tablet (3 mg total) by mouth daily. And 2 at hs 90 tablet 1 Past Month at Unknown time  . temazepam (RESTORIL) 30 MG capsule Take 1 capsule (30 mg total) by mouth at bedtime. 30 capsule 0 Past Month at Unknown time    Patient Stressors: Educational concerns Medication change or noncompliance  Patient Strengths: Manufacturing systems engineerCommunication skills Religious Affiliation Supportive family/friends  Treatment Modalities: Medication Management, Group therapy, Case management,  1 to 1 session with clinician, Psychoeducation, Recreational therapy.   Physician Treatment Plan for Primary Diagnosis: <principal problem not specified> Long Term Goal(s): Improvement in symptoms so as ready for discharge Improvement in symptoms so as ready for discharge   Short Term Goals: Compliance with prescribed medications will improve Compliance with prescribed medications will improve  Medication Management: Evaluate patient's response, side effects, and tolerance of medication regimen.  Therapeutic Interventions: 1 to 1 sessions, Unit Group sessions and Medication administration.  Evaluation of Outcomes: Progressing  Physician Treatment Plan for Secondary Diagnosis: Active Problems:   Bipolar disorder with psychotic features (HCC)   MDD (major depressive disorder), recurrent, severe, with psychosis (HCC)   Paranoid schizophrenia (HCC)  Long Term Goal(s): Improvement in symptoms so as ready for discharge Improvement in symptoms so as ready for discharge   Short Term Goals: Compliance with prescribed medications will improve Compliance with  prescribed medications will improve     Medication Management: Evaluate patient's response, side effects, and tolerance of medication regimen.  Therapeutic Interventions: 1 to 1 sessions, Unit Group sessions and Medication administration.  Evaluation of Outcomes: Progressing   RN Treatment Plan for Primary Diagnosis: <principal problem not specified> Long Term  Goal(s): Knowledge of disease and therapeutic regimen to maintain health will improve  Short Term Goals: Ability to identify and develop effective coping behaviors will improve and Compliance with prescribed medications will improve  Medication Management: RN will administer medications as ordered by provider, will assess and evaluate patient's response and provide education to patient for prescribed medication. RN will report any adverse and/or side effects to prescribing provider.  Therapeutic Interventions: 1 on 1 counseling sessions, Psychoeducation, Medication administration, Evaluate responses to treatment, Monitor vital signs and CBGs as ordered, Perform/monitor CIWA, COWS, AIMS and Fall Risk screenings as ordered, Perform wound care treatments as ordered.  Evaluation of Outcomes: Progressing   LCSW Treatment Plan for Primary Diagnosis: <principal problem not specified> Long Term Goal(s): Safe transition to appropriate next level of care at discharge, Engage patient in therapeutic group addressing interpersonal concerns.  Short Term Goals: Engage patient in aftercare planning with referrals and resources, Increase social support and Increase skills for wellness and recovery  Therapeutic Interventions: Assess for all discharge needs, 1 to 1 time with Social worker, Explore available resources and support systems, Assess for adequacy in community support network, Educate family and significant other(s) on suicide prevention, Complete Psychosocial Assessment, Interpersonal group therapy.  Evaluation of Outcomes: Progressing   Progress in Treatment: Attending groups: Yes. Participating in groups: Yes. Taking medication as prescribed: Yes. Toleration medication: Yes. Family/Significant other contact made: Yes, individual(s) contacted:  mother Patient understands diagnosis: No. Discussing patient identified problems/goals with staff: Yes. Medical problems stabilized or resolved:  Yes. Denies suicidal/homicidal ideation: Yes. Issues/concerns per patient self-inventory: No. Other: None  New problem(s) identified: No, Describe:  none  New Short Term/Long Term Goal(s):  Patient Goals:  "getting rest"  Discharge Plan or Barriers:   Reason for Continuation of Hospitalization: Delusions  Hallucinations Medication stabilization  Estimated Length of Stay: 1-2 days.  Attendees: Patient: 04/13/2019   Physician: Dr. Jeannine Kitten, MD 04/13/2019   Nursing: Roddie Mc, RN 04/13/2019   RN Care Manager: 04/13/2019   Social Worker: Daleen Squibb, LCSW 04/13/2019   Recreational Therapist:  04/13/2019   Other:  04/13/2019   Other:  04/13/2019   Other: 04/13/2019        Scribe for Treatment Team: Lorri Frederick, LCSW 04/13/2019 11:05 AM

## 2019-04-13 NOTE — Progress Notes (Signed)
DAR NOTE: Patient presents with flat affect and depressed mood.  Denies suicidal thoughts, pain, auditory and visual hallucinations.  Rates depression at 8, hopelessness at 5, and anxiety at 5.  Maintained on routine safety checks.  Medications given as prescribed.  Support and encouragement offered as needed.  Attended group and participated.  States goal for today is "getting discharge."  Patient visible in milieu with a blank stare and not interacting with peers.   Offered no complaint.

## 2019-04-13 NOTE — Progress Notes (Addendum)
[facility] MD Progress Note  04/13/2019 12:18 PM Todd [name] Todd [name]  MRN:  [id number] Subjective:  As elaborated previously this of course is readmission within a month's time for Todd [name] he received his long-acting injectable but it did not hold him in the absence of oral compliance.  He presented once again with a near identical petition.  He is compliant over the weekend his weekend was uneventful he continues to deny and minimize all current or past psychotic symptoms denies hearing and seeing things at the present time no EPS or TD. We can probably administer his long-acting injectable fairly soon but he is compliant here will probably go tomorrow or the next day  Principal Problem: Exacerbation of schizophrenic disorder noncompliant with orals Diagnosis: Active Problems:   Bipolar disorder with psychotic features (HCC)   MDD (major depressive disorder), recurrent, severe, with psychosis (HCC)   Paranoid schizophrenia (HCC)  Total Time spent with patient: 30 minutes  Past Medical History:  Past Medical History:  Diagnosis Date  . Bipolar 1 disorder (HCC)    History reviewed. No pertinent surgical history. Family History:  Family History  Problem Relation Age of Onset  . Hyperlipidemia Father   . Diabetes Maternal Grandmother   . Alcohol abuse Paternal Grandfather    Family Psychiatric  History: non new information revealed  Social History:  Social History   Substance and Sexual Activity  Alcohol Use Yes  . Alcohol/week: 0.0 standard drinks   Comment: Beer and Tequila on weekends.  3 beers and 4 shots of Tequila over 2 weekends per month     Social History   Substance and Sexual Activity  Drug Use Yes  . Types: Marijuana   Comment: Marijuana:  starts and stops.  When smoking, daily.  Tested every 2 weeks due to taking prescription drug abuse--stopped in [place].  Goes throught 05/2016. Has used MJ since 6th grade.  Went to [employer] a lot daily.     Social History   Socioeconomic History  . Marital status: Single    Spouse name: Not on file  . Number of children: 0  . Years of education: Not on file  . Highest education level: Not on file  Occupational History  . 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 Financial resource strain: Not on file  . Food insecurity:    Worry: Not on file    Inability: Not on file  . Transportation needs:    Medical: Not on file    Non-medical: Not on file  Tobacco Use  . Smoking status: Former Smoker    Packs/day: 0.00  . Smokeless tobacco: Never Used  Substance and Sexual Activity  . Alcohol use: Yes    Alcohol/week: 0.0 standard drinks    Comment: Beer and Tequila on weekends.  3 beers and 4 shots of Tequila over 2 weekends per month  . Drug use: Yes    Types: Marijuana    Comment: Marijuana:  starts and stops.  When smoking, daily.  Tested every 2 weeks due to taking prescription drug abuse--stopped in [place].  Goes throught 05/2016. Has used MJ since 6th grade.  Went to [employer] a lot daily.  Marland Kitchen Sexual activity: Yes    Partners: Female    Birth control/protection: Condom  Lifestyle  . Physical activity:    Days per week: Not on file    Minutes per session: Not on file  . Stress: Not on file  Relationships  .  Social connections:    Talks on phone: Not on file    Gets together: Not on file    Attends religious service: Not on file    Active member of club or organization: Not on file    Attends meetings of clubs or organizations: Not on file    Relationship status: Not on file  Other Topics Concern  . Not on file  Social History Narrative   Originally from [place]   Came to [employer]. When 23 years old.     Lives with parents and 59 yo sister.    Additional Social History:    Pain Medications: see  MAR Prescriptions: see MAR Over the Counter: see MAR History of alcohol / drug use?: Yes Longest period of sobriety (when/how long): family states  that they do not believe that patient has been drinking at least for the past six months Negative Consequences of Use: Legal Name of Substance 1: alcohol 1 - Age of First Use: unknown 1 - Amount (size/oz): 3 beers and 2 shots  1 - Frequency: twice monthly 1 - Duration: unknown 1 - Last Use / Amount: Unknown                  Sleep: Fair  Appetite:  Fair  Current Medications: Current Facility-Administered Medications  Medication Dose Route Frequency Provider Last Rate Last Dose  . alum & mag hydroxide-simeth (MAALOX/MYLANTA) 200-200-20 MG/5ML suspension 30 mL  30 mL Oral Q4H PRN Oneta Rack, NP      . benztropine (COGENTIN) tablet 0.5 mg  0.5 mg Oral BID Nira Conn A, NP   0.5 mg at 04/13/19 1610  . hydrOXYzine (ATARAX/VISTARIL) tablet 50 mg  50 mg Oral TID PRN Nira Conn A, NP      . magnesium hydroxide (MILK OF MAGNESIA) suspension 30 mL  30 mL Oral Daily PRN Oneta Rack, NP      . omega-3 acid ethyl esters (LOVAZA) capsule 1 g  1 g Oral BID Nira Conn A, NP   1 g at 04/13/19 0911  . prenatal multivitamin tablet 1 tablet  1 tablet Oral Q1200 Nira Conn A, NP   1 tablet at 04/13/19 1140  . risperiDONE (RISPERDAL) tablet 3 mg  3 mg Oral Daily Nira Conn A, NP   3 mg at 04/13/19 0912  . risperiDONE (RISPERDAL) tablet 6 mg  6 mg Oral QHS Nira Conn A, NP   6 mg at 04/12/19 2056  . temazepam (RESTORIL) capsule 15 mg  15 mg Oral QHS Antonieta Pert, MD   15 mg at 04/12/19 2056    Lab Results: No results found for this or any previous visit (from the past 48 hour(s)).  Blood Alcohol level:  Lab Results  Component Value Date   ETH <10 03/18/2019   ETH <10 04/05/2018    Metabolic Disorder Labs: Lab Results  Component Value Date   HGBA1C 5.4 06/01/2017   MPG 108 06/01/2017   Lab Results  Component Value Date   PROLACTIN 31.5 (H) 06/01/2017   PROLACTIN 34.6 (H) 03/17/2016   Lab Results  Component Value Date   CHOL 182 06/01/2017   TRIG 75  06/01/2017   HDL 36 (L) 06/01/2017   CHOLHDL 5.1 06/01/2017   VLDL 15 06/01/2017   LDLCALC 131 (H) 06/01/2017   LDLCALC 106 03/01/2016    Physical Findings: AIMS: Facial and Oral Movements Muscles of Facial Expression: None, normal Lips and Perioral Area: None, normal Jaw: None, normal  Tongue: None, normal,Extremity Movements Upper (arms, wrists, hands, fingers): None, normal Lower (legs, knees, ankles, toes): None, normal, Trunk Movements Neck, shoulders, hips: None, normal, Overall Severity Severity of abnormal movements (highest score from questions above): None, normal Incapacitation due to abnormal movements: None, normal Patient's awareness of abnormal movements (rate only patient's report): No Awareness, Dental Status Current problems with teeth and/or dentures?: No Does patient usually wear dentures?: No  CIWA:  CIWA-Ar Total: 0 COWS:     Musculoskeletal: Strength & Muscle Tone: within normal limits Gait & Station: normal Patient leans: N/A  Psychiatric Specialty Exam: Physical Exam  ROS  Blood pressure 100/66, pulse (!) 117, temperature 98 F (36.7 C), temperature source Oral, resp. rate 17, height 5\' 7"  (1.702 m), weight 68.5 kg, SpO2 100 %.Body mass index is 23.65 kg/m.  General Appearance: Casual  Eye Contact:  Good  Speech:  Clear and Coherent  Volume:  Normal  Mood:  Euthymic  Affect:  Congruent  Thought Process:  Irrelevant and Descriptions of Associations: Loose  Orientation:  Full (Time, Place, and Person)  Thought Content:  Illogical and Tangential  Suicidal Thoughts:  No  Homicidal Thoughts:  No  Memory:  Immediate;   Fair  Judgement:  Intact  Insight:  Fair  Psychomotor Activity:  NA and Normal  Concentration:  Concentration: Fair  Recall:  FiservFair  Fund of Knowledge:  Fair  Language:  Fair  Akathisia:  Negative  Handed:  Right  AIMS (if indicated):     Assets:  Communication Skills Desire for Improvement  ADL's:  Intact  Cognition:  WNL   Sleep:  Number of Hours: 7.75     Treatment Plan Summary: Continue current meds and precautions without change continue reality based therapy anticipate discharge within 24 to 48 hours would appreciate support upon discharge of community support team  Malvin JohnsFARAH,Kamau Weatherall, MD 04/13/2019, 12:18 PM

## 2019-04-13 NOTE — Progress Notes (Signed)
CSW spoke with Jaquita Folds of [facility].  They do not have openings for ACT team currently but do have CST openings--CSW faxed referral previously, she just needs PSA and recommendation from MD.  They will review and get back to [place].  Pt would need med mgmt at [facility] if working with CST. Garner Nash, MSW, LCSW Clinical Social Worker 04/13/2019 11:10 AM

## 2019-04-13 NOTE — Progress Notes (Signed)
Recreation Therapy Notes  Date: 4.27.20 Time: 1000 Location: 500 Hall Dayroom  Group Topic: Coping Skills  Goal Area(s) Addresses:  Patient will be able to identify positive coping strategies. Patient will be able to identify importance of using coping skills. Patient will identify benefits of using coping skills post d/c.  Behavioral Response: Minimal  Intervention: Worksheet  Activity: Coping Skills A to Z.  Patients were given a worksheet with the alphabet from A to Z.  Patients were to identify a coping skill for each letter of the alphabet. Patients would then share some of their coping skills with the group.  Education: Pharmacologist, Building control surveyor.   Education Outcome: Acknowledges understanding/In group clarification offered/Needs additional education.   Clinical Observations/Feedback: Pt was quiet but spoke when engaged.  Pt identified some of his coping skills as exercise, apple, ceiling, bike, dog and great.    [name], LRT/CTRS         [name] A 04/13/2019 11:37 AM

## 2019-04-14 MED ORDER — TEMAZEPAM 15 MG PO CAPS: 15.0000 mg | ORAL_CAPSULE | Freq: Every day | ORAL | 0 refills | Status: DC

## 2019-04-14 MED ORDER — RISPERIDONE 3 MG PO TABS: ORAL_TABLET | ORAL | 2 refills | Status: DC

## 2019-04-14 MED ORDER — BENZTROPINE MESYLATE 0.5 MG PO TABS: 0.5000 mg | ORAL_TABLET | Freq: Two times a day (BID) | ORAL | 2 refills | Status: DC

## 2019-04-14 MED ORDER — ARIPIPRAZOLE ER 400 MG IM SRER: 400.0000 mg | INTRAMUSCULAR | 11 refills | Status: DC

## 2019-04-14 MED ORDER — ARIPIPRAZOLE ER 400 MG IM SRER
400.0000 mg | INTRAMUSCULAR | Status: DC
  Administered 2019-04-14: 400 mg via INTRAMUSCULAR

## 2019-04-14 MED ORDER — OMEGA-3-ACID ETHYL ESTERS 1 G PO CAPS: 1.0000 g | ORAL_CAPSULE | Freq: Two times a day (BID) | ORAL | 1 refills | Status: DC

## 2019-04-14 MED ORDER — PRENATAL MULTIVITAMIN CH: 1.0000 | ORAL_TABLET | Freq: Every day | ORAL | 2 refills | Status: DC

## 2019-04-14 MED ORDER — FERROUS SULFATE 325 (65 FE) MG PO TABS: ORAL_TABLET | ORAL | 3 refills | Status: DC

## 2019-04-14 NOTE — Plan of Care (Signed)
Pt attended one recreation therapy group session.    Todd [name], LRT/CTRS 

## 2019-04-14 NOTE — Progress Notes (Signed)
D: Pt denies SI/HI/AVH. Pt is pleasant and cooperative. Pt continues to be paranoid A: Pt was offered support and encouragement. Pt was given scheduled medications. Pt was encourage to attend groups. Q 15 minute checks were done for safety.  R: safety maintained on unit.

## 2019-04-14 NOTE — Progress Notes (Signed)
D: Pt A & O X 3. Presents guarded with flat affect. . Denies SI, HI, AVH and pain at this time. D/C home as ordered. Picked up in lobby by his mother.  A: D/C instructions reviewed with pt including prescriptions, medication samples and follow up appointment, compliance encouraged. All belongings from locker # 7 given to pt at time of departure. Scheduled medications given with verbal education and effects monitored. Safety checks maintained without incident till time of d/c.  R: Pt receptive to care. Compliant with medications when offered. Denies adverse drug reactions when assessed. Verbalized understanding related to d/c instructions. Signed belonging sheet in agreement with items received from locker. Ambulatory with a steady gait. Appears to be in no physical distress at time of departure.
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
## 2019-04-14 NOTE — BHH Suicide Risk Assessment (Signed)
[facility] Discharge Suicide Risk Assessment   Principal Problem: Exacerbation of underlying psychotic disorder Discharge Diagnoses: Active Problems:   Bipolar disorder with psychotic features (HCC)   MDD (major depressive disorder), recurrent, severe, with psychosis (HCC)   Paranoid schizophrenia (HCC)   Total Time spent with patient: 45 minutes  Musculoskeletal: Strength & Muscle Tone: within normal limits Gait & Station: normal Patient leans: N/A  Psychiatric Specialty Exam: ROS  Blood pressure 99/65, pulse (!) 105, temperature 97.8 F (36.6 C), temperature source Oral, resp. rate 17, height 5\' 7"  (1.702 m), weight 68.5 kg, SpO2 100 %.Body mass index is 23.65 kg/m.  General Appearance: Casual and Disheveled  Eye Contact::  Fair  Speech:  Clear and Coherent409  Volume:  Normal  Mood:  Euthymic  Affect:  Constricted  Thought Process:  Coherent and Linear  Orientation:  Full (Time, Place, and Person)  Thought Content:  Logical and Tangential  Suicidal Thoughts:  No  Homicidal Thoughts:  No  Memory:  Immediate;   Poor 1/3  Judgement:  Intact  Insight:  Present  Psychomotor Activity:  Normal  Concentration:  Good  Recall:  Good  Fund of Knowledge:Good  Language: Good  Akathisia:  Negative  Handed:  Right  AIMS (if indicated):     Assets:  Communication Skills Desire for Improvement  Sleep:  Number of Hours: 6.75  Cognition: WNL  ADL's:  Intact   Mental Status Per Nursing Assessment::   On Admission:  Thoughts of violence towards others, Intention to act on plan to harm others  Demographic Factors:  Male  Loss Factors: Decrease in vocational status  Historical Factors: NA  Risk Reduction Factors:   NA  Continued Clinical Symptoms:  Schizophrenia:   Less than 58 years old  Cognitive Features That Contribute To Risk:  Loss of executive function    Suicide Risk:  Minimal: No identifiable suicidal ideation.  Patients presenting with no risk factors but with  morbid ruminations; may be classified as minimal risk based on the severity of the depressive symptoms  Follow-up Information    [facility] Follow up on 04/15/2019.   Why:  Hospital follow up appointment with Corrie Dandy is Wednesday, 4/29 at 1:30p.  The appointment will be held over the phone and Corrie Dandy will contact you.  Contact information: [address] [place] [address] [phone]        [facility] Follow up on 04/15/2019.   Why:  You have been referred to the [facility].  They will call you Wednesday, 04/15/19, at 10:30am for your video/phone intake session.  Contact information: [address] [place] [address] [phone]           Plan Of Care/Follow-up recommendations:  Activity:  full  Laia Wiley, MD 04/14/2019, 11:24 AM

## 2019-04-14 NOTE — Progress Notes (Signed)
CSW spoke with Loney Loh, mother, regarding pt discharge today.  CSW explained Community support team in addition to medication at [facility].  Contact info for [facility] CST team provided.  [facility] discharge appt also discussed and mother informed this will be phone appt. Garner Nash, MSW, LCSW Clinical Social Worker 04/14/2019 10:22 AM

## 2019-04-14 NOTE — Progress Notes (Signed)
Recreation Therapy Notes  INPATIENT RECREATION TR PLAN  Patient Details Name: Todd [name] MRN: [id number] DOB: Sep 01, 1996 Today's Date: 04/14/2019  Rec Therapy Plan Is patient appropriate for Therapeutic Recreation?: Yes Treatment times per week: about 3 days Estimated Length of Stay: 5-7 days TR Treatment/Interventions: Group participation (Comment)  Discharge Criteria Pt will be discharged from therapy if:: Discharged Treatment plan/goals/alternatives discussed and agreed upon by:: Patient/family  Discharge Summary Short term goals set: See patient care plan Short term goals met: Adequate for discharge Progress toward goals comments: Groups attended Which groups?: Coping skills Reason goals not met: Pt attended one group session. Therapeutic equipment acquired: N/A Reason patient discharged from therapy: Discharge from hospital Pt/family agrees with progress & goals achieved: Yes Date patient discharged from therapy: 04/14/19    Todd [name], LRT/CTRS  Ria Comment, Rubina Basinski A 04/14/2019, 12:06 PM

## 2019-04-14 NOTE — Progress Notes (Signed)
Recreation Therapy Notes  Date: 4.28.20 Time: 1000 Location: 500 Hall Dayroom  Group Topic: Triggers  Goal Area(s) Addresses:  Patient will identify biggest triggers. Patient will identify how triggers can be avoided. Patient will identify how to face triggers head on.  Intervention: Worksheet  Activity:  Triggers.  Patient was to identify their 3 biggest triggers, how they avoid/reduse exposure to triggers and how they deal with them head on.  Education: Triggers, Discharge Planning  Education Outcome: Acknowledges understanding/In group clarification offered/Needs additional education.   Clinical Observations/Feedback:  Pt did not attend group.    Eleasha Cataldo, LRT/CTRS         Donetta Isaza A 04/14/2019 12:01 PM 

## 2019-04-20 NOTE — Telephone Encounter (Signed)
See phone note

## 2019-06-23 ENCOUNTER — Emergency Department (HOSPITAL_COMMUNITY)
Admission: EM | Admit: 2019-06-23 | Discharge: 2019-06-23 | Disposition: A | Discharge status: Home / Self Care | Payer: Self-pay | Attending: Emergency Medicine | Admitting: Emergency Medicine

## 2019-06-23 ENCOUNTER — Encounter (HOSPITAL_COMMUNITY): Payer: Self-pay | Admitting: *Deleted

## 2019-06-23 ENCOUNTER — Other Ambulatory Visit: Payer: Self-pay

## 2019-06-23 DIAGNOSIS — Y999 Unspecified external cause status: Secondary | ICD-10-CM | POA: Insufficient documentation

## 2019-06-23 DIAGNOSIS — Y939 Activity, unspecified: Secondary | ICD-10-CM | POA: Insufficient documentation

## 2019-06-23 DIAGNOSIS — W2209XA Striking against other stationary object, initial encounter: Secondary | ICD-10-CM | POA: Insufficient documentation

## 2019-06-23 DIAGNOSIS — Z79899 Other long term (current) drug therapy: Secondary | ICD-10-CM | POA: Insufficient documentation

## 2019-06-23 DIAGNOSIS — S161XXA Strain of muscle, fascia and tendon at neck level, initial encounter: Secondary | ICD-10-CM | POA: Insufficient documentation

## 2019-06-23 DIAGNOSIS — T148XXA Other injury of unspecified body region, initial encounter: Secondary | ICD-10-CM

## 2019-06-23 DIAGNOSIS — Y929 Unspecified place or not applicable: Secondary | ICD-10-CM | POA: Insufficient documentation

## 2019-06-23 DIAGNOSIS — F250 Schizoaffective disorder, bipolar type: Secondary | ICD-10-CM | POA: Insufficient documentation

## 2019-06-23 DIAGNOSIS — S46911A Strain of unspecified muscle, fascia and tendon at shoulder and upper arm level, right arm, initial encounter: Secondary | ICD-10-CM | POA: Insufficient documentation

## 2019-06-23 NOTE — ED Provider Notes (Signed)
[facility] Provider Note   CSN: [id number] Arrival date & time: 06/23/19  1124    History   Chief Complaint Chief Complaint  Patient presents with   Fall    HPI Todd [name] is a 23 y.o. male.     HPI   23 year old male presents today with complaints of shoulder neck pain.  Patient notes he was up on a ladder when he slipped, he was able to catch himself with his right hand and slammed into the wall.  He notes he did not fall from the ladder.  He notes he had no significant pain after the injury but developed pain thereafter.  He notes a sharp pain in the right lateral neck today while lifting shingles this was brief.  He denies any loss of distal sensation strength or motor function.  Chest pain abdominal pain or any other acute signs of trauma.  Past Medical History:  Diagnosis Date   Bipolar 1 disorder [facility](HCC)     Patient Active Problem List   Diagnosis Date Noted   Paranoid schizophrenia (HCC)    Bipolar disorder with psychotic features (HCC) 04/06/2019   MDD (major depressive disorder), recurrent, severe, with psychosis (HCC) 04/06/2019   Severe manic bipolar 1 disorder with psychotic behavior (HCC) 06/03/2017   Tobacco use disorder 03/19/2016   Cannabis use disorder, mild, abuse 03/18/2016    History reviewed. No pertinent surgical history.      Home Medications    Prior to Admission medications   Medication Sig Start Date End Date Taking? Authorizing Provider  ARIPiprazole ER (ABILIFY MAINTENA) 400 MG SRER injection Inject 2 mLs (400 mg total) into the muscle every 28 (twenty-eight) days. Due 5/28 04/14/19   Malvin JohnsFarah, Brian, MD  benztropine (COGENTIN) 0.5 MG tablet Take 1 tablet (0.5 mg total) by mouth 2 (two) times daily. 04/14/19   Malvin JohnsFarah, Brian, MD  ferrous sulfate 325 (65 FE) MG tablet Take 1 w breakfast Every other day 04/14/19   Malvin JohnsFarah, Brian, MD  omega-3 acid ethyl esters (LOVAZA) 1 g capsule Take  1 capsule (1 g total) by mouth 2 (two) times daily. 04/14/19   Malvin JohnsFarah, Brian, MD  Prenatal Vit-Fe Fumarate-FA (PRENATAL MULTIVITAMIN) TABS tablet Take 1 tablet by mouth daily at 12 noon. 04/14/19   Malvin JohnsFarah, Brian, MD  risperiDONE (RISPERDAL) 3 MG tablet 1 in am 2 at hs 04/14/19   Malvin JohnsFarah, Brian, MD  temazepam (RESTORIL) 15 MG capsule Take 1 capsule (15 mg total) by mouth at bedtime. 04/14/19   Malvin JohnsFarah, Brian, MD    Family History Family History  Problem Relation Age of Onset   Hyperlipidemia Father    Diabetes Maternal Grandmother    Alcohol abuse Paternal Grandfather     Social History Social History   Tobacco Use   Smoking status: Former Smoker    Packs/day: 0.00   Smokeless tobacco: Never Used  Substance Use Topics   Alcohol use: Yes    Alcohol/week: 0.0 standard drinks    Comment: Beer and Tequila on weekends.  3 beers and 4 shots of Tequila over 2 weekends per month   Drug use: Yes    Types: Marijuana    Comment: Marijuana:  starts and stops.  When smoking, daily.  Tested every 2 weeks due to taking prescription drug abuse--stopped in [place].  Goes throught 05/2016. Has used MJ since 6th grade.  Went to [employer]--smoked a lot daily.     Allergies   Patient has  no known allergies.   Review of Systems Review of Systems  All other systems reviewed and are negative.    Physical Exam Updated Vital Signs BP 115/74 (BP Location: Right Arm)    Pulse 61    Temp 98.7 F (37.1 C) (Oral)    Resp 16    SpO2 100%   Physical Exam Vitals signs and nursing note reviewed.  Constitutional:      Appearance: He is well-developed.  HENT:     Head: Normocephalic and atraumatic.  Eyes:     General: No scleral icterus.       Right eye: No discharge.        Left eye: No discharge.     Conjunctiva/sclera: Conjunctivae normal.     Pupils: Pupils are equal, round, and reactive to light.  Neck:     Musculoskeletal: Normal range of motion.     Vascular: No JVD.     Trachea: No  tracheal deviation.  Pulmonary:     Effort: Pulmonary effort is normal.     Breath sounds: No stridor.  Musculoskeletal:     Comments: Right shoulder atraumatic, no swelling or edema, tenderness palpation of the right trapezius and right lateral cervical musculature, grip strength at the right extremity 5 out of 5 radial pulse 2+ sensation intact full active range of motion without significant discomfort at the shoulder. No midline C-T spinous tenderness  Neurological:     Mental Status: He is alert and oriented to person, place, and time.     Coordination: Coordination normal.  Psychiatric:        Behavior: Behavior normal.        Thought Content: Thought content normal.        Judgment: Judgment normal.      ED Treatments / Results  Labs (all labs ordered are listed, but only abnormal results are displayed) Labs Reviewed - No data to display  EKG None  Radiology No results found.  Procedures Procedures (including critical care time)  Medications Ordered in ED Medications - No data to display   Initial Impression / Assessment and Plan / ED Course  I have reviewed the triage vital signs and the nursing notes.  Pertinent labs & imaging results that were available during my care of the patient were reviewed by me and considered in my medical decision making (see chart for details).         Assessment/Plan: 23 year old male presents today with likely muscular strain.  Patient has no acute abnormalities requiring further evaluation or management here in the ED.  Symptomatic care instructions given return precautions given.  Verbalized understanding and agreement to today's plan had no further questions or concerns.   Final Clinical Impressions(s) / ED Diagnoses   Final diagnoses:  Muscle strain    ED Discharge Orders    None       Francee Gentile 06/23/19 1318    Blanchie Dessert, MD 06/26/19 2129

## 2019-06-23 NOTE — Discharge Instructions (Addendum)
Please read attached information. If you experience any new or worsening signs or symptoms please return to the emergency room for evaluation. Please follow-up with your primary care provider or specialist as discussed.  °

## 2019-06-23 NOTE — ED Triage Notes (Signed)
Pt reports slipping off a approx 9-10 ft ladder yesterday. He reports hanging from side and then fell down to ground. Has pain to right side of head, neck and shoulder. Denies loc. No distress noted at triage.

## 2020-12-05 ENCOUNTER — Ambulatory Visit (HOSPITAL_COMMUNITY)
Admission: EM | Admit: 2020-12-05 | Discharge: 2020-12-05 | Disposition: A | Discharge status: Home / Self Care | Payer: No Payment, Other | Attending: Psychiatry | Admitting: Psychiatry

## 2020-12-05 ENCOUNTER — Encounter (HOSPITAL_COMMUNITY): Payer: Self-pay

## 2020-12-05 ENCOUNTER — Other Ambulatory Visit: Payer: Self-pay

## 2020-12-05 DIAGNOSIS — F200 Paranoid schizophrenia: Secondary | ICD-10-CM | POA: Insufficient documentation

## 2020-12-05 DIAGNOSIS — Z9114 Patient's other noncompliance with medication regimen: Secondary | ICD-10-CM | POA: Insufficient documentation

## 2020-12-05 HISTORY — DX: Headache, unspecified: R51.9

## 2020-12-05 LAB — LIPID PANEL
Cholesterol: 188 mg/dL (ref 0–200)
HDL: 31 mg/dL — ABNORMAL LOW (ref >40)
LDL Cholesterol: 130 mg/dL — ABNORMAL HIGH (ref 0–99)
Total CHOL/HDL Ratio: 6.1 RATIO
Triglycerides: 137 mg/dL (ref <150)
VLDL: 27 mg/dL (ref 0–40)

## 2020-12-05 LAB — CBC WITH DIFFERENTIAL/PLATELET
Abs Immature Granulocytes: 0.03 10*3/uL (ref 0.00–0.07)
Basophils Absolute: 0 10*3/uL (ref 0.0–0.1)
Basophils Relative: 0 %
Eosinophils Absolute: 0.1 10*3/uL (ref 0.0–0.5)
Eosinophils Relative: 1 %
HCT: 47.7 % (ref 39.0–52.0)
Hemoglobin: 15.9 g/dL (ref 13.0–17.0)
Immature Granulocytes: 0 %
Lymphocytes Relative: 35 %
Lymphs Abs: 3.5 10*3/uL (ref 0.7–4.0)
MCH: 29.9 pg (ref 26.0–34.0)
MCHC: 33.3 g/dL (ref 30.0–36.0)
MCV: 89.7 fL (ref 80.0–100.0)
Monocytes Absolute: 0.9 10*3/uL (ref 0.1–1.0)
Monocytes Relative: 9 %
Neutro Abs: 5.3 10*3/uL (ref 1.7–7.7)
Neutrophils Relative %: 55 %
Platelets: 315 10*3/uL (ref 150–400)
RBC: 5.32 MIL/uL (ref 4.22–5.81)
RDW: 12.5 % (ref 11.5–15.5)
WBC: 9.8 10*3/uL (ref 4.0–10.5)
nRBC: 0 % (ref 0.0–0.2)

## 2020-12-05 LAB — COMPREHENSIVE METABOLIC PANEL
ALT: 25 U/L (ref 0–44)
AST: 26 U/L (ref 15–41)
Albumin: 4.5 g/dL (ref 3.5–5.0)
Alkaline Phosphatase: 71 U/L (ref 38–126)
Anion gap: 12 (ref 5–15)
BUN: 10 mg/dL (ref 6–20)
CO2: 25 mmol/L (ref 22–32)
Calcium: 9.4 mg/dL (ref 8.9–10.3)
Chloride: 102 mmol/L (ref 98–111)
Creatinine, Ser: 0.79 mg/dL (ref 0.61–1.24)
GFR, Estimated: >60 mL/min (ref >60)
Glucose, Bld: 98 mg/dL (ref 70–99)
Potassium: 3.4 mmol/L — ABNORMAL LOW (ref 3.5–5.1)
Sodium: 139 mmol/L (ref 135–145)
Total Bilirubin: 0.8 mg/dL (ref 0.3–1.2)
Total Protein: 7.3 g/dL (ref 6.5–8.1)

## 2020-12-05 LAB — TSH: TSH: 4.558 u[IU]/mL — ABNORMAL HIGH (ref 0.350–4.500)

## 2020-12-05 LAB — HEMOGLOBIN A1C
Hgb A1c MFr Bld: 5.5 % (ref 4.8–5.6)
Mean Plasma Glucose: 111.15 mg/dL

## 2020-12-05 MED ORDER — RISPERIDONE 2 MG PO TABS
2.0000 mg | ORAL_TABLET | Freq: Once | ORAL | Status: AC
  Administered 2020-12-05: 2 mg via ORAL
  Filled 2020-12-05: qty 1

## 2020-12-05 MED ORDER — BENZTROPINE MESYLATE 0.5 MG PO TABS: 0.5000 mg | ORAL_TABLET | Freq: Two times a day (BID) | ORAL | 0 refills | Status: DC

## 2020-12-05 MED ORDER — BENZTROPINE MESYLATE 0.5 MG PO TABS
0.5000 mg | ORAL_TABLET | Freq: Two times a day (BID) | ORAL | Status: DC
  Filled 2020-12-05: qty 14

## 2020-12-05 MED ORDER — RISPERIDONE 2 MG PO TABS
2.0000 mg | ORAL_TABLET | Freq: Two times a day (BID) | ORAL | Status: DC
  Filled 2020-12-05: qty 14

## 2020-12-05 MED ORDER — RISPERIDONE 2 MG PO TABS: 2.0000 mg | ORAL_TABLET | Freq: Two times a day (BID) | ORAL | 0 refills | Status: DC

## 2020-12-05 NOTE — BH Assessment (Signed)
Comprehensive Clinical Assessment (CCA) Note  12/05/2020 [place] [id number] Patient presents this date voluntary with parents. Patient denies any S/I, H/I or AVH. Patient is observed to be somewhat disorganized and renders limited history this date. Information to complete assessment was obtained from parents and sister. Patient resides at home with family and has not been taking mental health medications. Per family, patient has been off medications for over six months and has had frequent verbal altercations with parents. Patient is uncertain of his diagnosis or what medications he has been prescribed in the past. Per chart review, patient has a extensive history of psychosis and was receving OP services from [facility] who assited with medication management although family members report patient has not met wit that provider since early 2020. Patient per history review, was inpatient from 03/19/19-03/27/19 and was discharged on 03/27/19 from [facility] after he was with IVC for similar behaviors at home. Patient denies any S/I, H/I or AVH this date. Patient denies any access to firearms or current legal charges. Patient denies any prior attempts or gestures at self harm. Patient per chart review has a history of Schizophrenia and medication non-compliance. Family is requesting this date that patient be evaluated for possible medication interventions.   Money NP evalauted patient this date and writes:  Patient presents voluntarily as a walk-in to the [facility] with his family. Patient has a documented history of schizophrenia and was admitted at [facility] on 03/19/2019 through 03/27/2019 and then again on 04/06/2019 through 04/14/2019.  Patient presents denying any suicidal homicidal ideations and denies any hallucinations.  Family reports that patient has been off his medications for 6 months and that he has been decompensating some. They are requesting that medications be restarted for this patient  stricken remained stable.  Patient reports that he did stop taking them and he has no excuse why.  Patient states that he is willing to restart his medications.  Patient reports that he has not had any follow-up appointments nor have blood work drawn.  Patient agrees to have a EKG, lab work for lipid panel, CBC, CMP, hemoglobin A1c.  After reviewing patient's chart patient was prescribed Abilify Maintena 400 mg IM every 30 days, Cogentin 0.5 mg p.o. twice daily, Risperdal 1 mg every morning and 2 mg nightly, and Restoril 15 mg p.o. nightly.  Patient agreed to restart the Risperdal and Cogentin.  Discussed with patient to go and take a dose now since he has been without his Risperdal for a while and he agreed.  Patient was provided with 1 dose of Risperdal 2 mg and was provided with 7-day samples and 30-day prescriptions of Risperdal 2 mg p.o. twice daily and Cogentin 0.5 mg p.o. twice daily.  Patient was instructed to follow-up at open access with the [facility] and provided with dates and times for open access. Patient does seem a little confused during questioning but was calm, cooperative, and pleasant. Patient not appear to be responding to any internal or external stimuli but did seem to have some difficulty with his thought process.        Chief Complaint:  Chief Complaint  Patient presents with  . Aggressive Behavior    For 1-2 days, got into a confrontation with his parents because they wanted to kick him out.looking for housing.   Visit Diagnosis: Schizophrenia     CCA Screening, Triage and Referral (STR)  Patient Reported Information How did you hear about [place]? No data recorded Referral  name: No data recorded Referral phone number: No data recorded  Whom do you see for routine medical problems? No data recorded Practice/Facility Name: No data recorded Practice/Facility Phone Number: No data recorded Name of Contact: No data recorded Contact Number: No data recorded Contact Fax Number: No  data recorded Prescriber Name: No data recorded Prescriber Address (if known): No data recorded  What Is the Reason for Your Visit/Call Today? No data recorded How Long Has This Been Causing You Problems? No data recorded What Do You Feel Would Help You the Most Today? No data recorded  Have You Recently Been in Any Inpatient Treatment (Hospital/Detox/Crisis Center/28-Day Program)? No data recorded Name/Location of Program/Hospital:No data recorded How Long Were You There? No data recorded When Were You Discharged? No data recorded  Have You Ever Received Services From [facility] Before? No data recorded Who Do You See at [facility]? No data recorded  Have You Recently Had Any Thoughts About Hurting Yourself? No data recorded Are You Planning to Commit Suicide/Harm Yourself At This time? No data recorded  Have you Recently Had Thoughts About [place]? No data recorded Explanation: No data recorded  Have You Used Any Alcohol or Drugs in the Past 24 Hours? No data recorded How Long Ago Did You Use Drugs or Alcohol? No data recorded What Did You Use and How Much? No data recorded  Do You Currently Have a Therapist/Psychiatrist? No data recorded Name of Therapist/Psychiatrist: No data recorded  Have You Been Recently Discharged From Any Office Practice or Programs? No data recorded Explanation of Discharge From Practice/Program: No data recorded    CCA Screening Triage Referral Assessment Type of Contact: No data recorded Is this Initial or Reassessment? No data recorded Date Telepsych consult ordered in CHL:  No data recorded Time Telepsych consult ordered in [facility]:  No data recorded  Patient Reported Information Reviewed? No data recorded Patient Left Without Being Seen? No data recorded Reason for Not Completing Assessment: No data recorded  Collateral Involvement: No data recorded  Does Patient Have a [place]? No data recorded Name and  Contact of Legal Guardian: No data recorded If Minor and Not Living with Parent(s), Who has Custody? No data recorded Is CPS involved or ever been involved? No data recorded Is APS involved or ever been involved? No data recorded  Patient Determined To Be At Risk for Harm To Self or Others Based on Review of Patient Reported Information or Presenting Complaint? No data recorded Method: No data recorded Availability of Means: No data recorded Intent: No data recorded Notification Required: No data recorded Additional Information for Danger to Others Potential: No data recorded Additional Comments for Danger to Others Potential: No data recorded Are There Guns or Other Weapons in Your Home? No data recorded Types of Guns/Weapons: No data recorded Are These Weapons Safely Secured?                            No data recorded Who Could Verify You Are Able To Have These Secured: No data recorded Do You Have any Outstanding Charges, Pending Court Dates, Parole/Probation? No data recorded Contacted To Inform of Risk of Harm To Self or Others: No data recorded  Location of Assessment: No data recorded  Does Patient Present under Involuntary Commitment? No data recorded IVC Papers Initial File Date: No data recorded  [place] of Residence: No data recorded  Patient Currently Receiving the Following  Services: No data recorded  Determination of Need: No data recorded  Options For Referral: No data recorded    CCA Biopsychosocial Intake/Chief Complaint:  Pt is requesting to be restrated on medications.  Current Symptoms/Problems: No data recorded  Patient Reported Schizophrenia/Schizoaffective Diagnosis in Past: No   Strengths: No data recorded Preferences: No data recorded Abilities: No data recorded  Type of Services Patient Feels are Needed: No data recorded  Initial Clinical Notes/Concerns: No data recorded  Mental Health Symptoms Depression:  Difficulty Concentrating    Duration of Depressive symptoms: No data recorded  Mania:  None   Anxiety:   None   Psychosis:  None   Duration of Psychotic symptoms: No data recorded  Trauma:  None   Obsessions:  None   Compulsions:  None   Inattention:  Avoids/dislikes activities that require focus   Hyperactivity/Impulsivity:  N/A   Oppositional/Defiant Behaviors:  None   Emotional Irregularity:  None   Other Mood/Personality Symptoms:  No data recorded   Mental Status Exam Appearance and self-care  Stature:  Average   Weight:  Average weight   Clothing:  Casual   Grooming:  Normal   Cosmetic use:  None   Posture/gait:  Normal   Motor activity:  Not Remarkable   Sensorium  Attention:  Normal   Concentration:  Preoccupied   Orientation:  Object; Person; Place   Recall/memory:  Normal   Affect and Mood  Affect:  Appropriate   Mood:  Negative   Relating  Eye contact:  Fleeting   Facial expression:  Fearful   Attitude toward examiner:  Cooperative   Thought and Language  Speech flow: Normal   Thought content:  Appropriate to Mood and Circumstances   Preoccupation:  None   Hallucinations:  None   Organization:  No data recorded  [employer] of Knowledge:  Poor   Intelligence:  Below average   Abstraction:  Normal   Judgement:  Normal   Reality Testing:  Realistic   Insight:  Poor   Decision Making:  Only simple   Social Functioning  Social Maturity:  Responsible   Social Judgement:  Normal   Stress  Stressors:  Family conflict   Coping Ability:  Normal   Skill Deficits:  Decision making   Supports:  Family     Religion: Religion/Spirituality Are You A Religious Person?: No  Leisure/Recreation: Leisure / Recreation Do You Have Hobbies?: No  Exercise/Diet: Exercise/Diet Do You Exercise?: No Have You Gained or Lost A Significant Amount of Weight in the Past Six Months?: No Do You Follow a Special Diet?: No Do You Have Any  Trouble Sleeping?: No   CCA Employment/Education Employment/Work Situation: Employment / Work Copywriter, advertising Employment situation: Unemployed  Education:     CCA Family/Childhood History Family and Relationship History: Family history Marital status: Single  Childhood History:  Childhood History Does patient have siblings?: Yes Number of Siblings: 1 Did patient suffer any verbal/emotional/physical/sexual abuse as a child?: No Did patient suffer from severe childhood neglect?: No Has patient ever been sexually abused/assaulted/raped as an adolescent or adult?: No Was the patient ever a victim of a crime or a disaster?: No Witnessed domestic violence?: No Has patient been affected by domestic violence as an adult?: No  Child/Adolescent Assessment:     CCA Substance Use Alcohol/Drug Use:                           ASAM's:  Six Dimensions of Multidimensional Assessment  Dimension 1:  Acute Intoxication and/or Withdrawal Potential:      Dimension 2:  Biomedical Conditions and Complications:      Dimension 3:  Emotional, Behavioral, or Cognitive Conditions and Complications:     Dimension 4:  Readiness to Change:     Dimension 5:  Relapse, Continued use, or Continued Problem Potential:     Dimension 6:  Recovery/Living Environment:     ASAM Severity Score:    ASAM Recommended Level of Treatment:     Substance use Disorder (SUD)    Recommendations for Services/Supports/Treatments:    DSM5 Diagnoses: Patient Active Problem List   Diagnosis Date Noted  . Paranoid schizophrenia ([place])   . Bipolar disorder with psychotic features ([place]) 04/06/2019  . MDD (major depressive disorder), recurrent, severe, with psychosis ([place]) 04/06/2019  . Severe manic bipolar 1 disorder with psychotic behavior ([place]) 06/03/2017  . Tobacco use disorder 03/19/2016  . Cannabis use disorder, mild, abuse 03/18/2016    Patient Centered Plan: Patient is on the following Treatment  Plan(s):   Referrals to Alternative Service(s): Referred to Alternative Service(s):   Place:   Date:   Time:    Referred to Alternative Service(s):   Place:   Date:   Time:    Referred to Alternative Service(s):   Place:   Date:   Time:    Referred to Alternative Service(s):   Place:   Date:   Time:     Mamie Nick, LCAS

## 2020-12-05 NOTE — ED Provider Notes (Signed)
Behavioral Health Urgent Care Medical Screening Exam  Patient Name: Todd [name] MRN: [id number] Date of Evaluation: 12/05/20 Chief Complaint: Chief Complaint/Presenting Problem: Pt is requesting to be restrated on medications. Diagnosis:  Final diagnoses:  Paranoid schizophrenia (HCC)    History of Present illness: Todd [name] is a 24 y.o. male.  Patient presents voluntarily as a walk-in to the [facility] with his family.  Patient has a documented history of schizophrenia and was admitted at [facility] on 03/19/2019 through 03/27/2019 and then again on 04/06/2019 through 04/14/2019.  Patient presents denying any suicidal homicidal ideations and denies any hallucinations.  Family reports that patient has been off his medications for 6 months and that he has been decompensating some.  They are requesting that medications be restarted for this patient stricken remained stable.  Patient reports that he did stop taking them and he has no excuse why.  Patient states that he is willing to restart his medications.  Patient reports that he has not had any follow-up appointments nor have blood work drawn.  Patient agrees to have a EKG, lab work for lipid panel, CBC, CMP, hemoglobin A1c.  After reviewing patient's chart patient was prescribed Abilify Maintena 400 mg IM every 30 days, Cogentin 0.5 mg p.o. twice daily, Risperdal 1 mg every morning and 2 mg nightly, and Restoril 15 mg p.o. nightly.  Patient agreed to restart the Risperdal and Cogentin.  Discussed with patient to go and take a dose now since he has been without his Risperdal for a while and he agreed.  Patient was provided with 1 dose of Risperdal 2 mg and was provided with 7-day samples and 30-day prescriptions of Risperdal 2 mg p.o. twice daily and Cogentin 0.5 mg p.o. twice daily.  Patient was instructed to follow-up at open access with the [facility] and provided with dates and times for open access.  Patient does seem a  little confused during questioning but was calm, cooperative, and pleasant.  Patient not appear to be responding to any internal or external stimuli but did seem to have some difficulty with his thought process.  Psychiatric Specialty Exam  Presentation  General Appearance:Appropriate for Environment; Casual  Eye Contact:Good  Speech:Clear and Coherent; Normal Rate  Speech Volume:Decreased  Handedness:Right   Mood and Affect  Mood:Euthymic  Affect:Flat   Thought Process  Thought Processes:Coherent  Descriptions of Associations:Intact  Orientation:Full (Time, Place and Person)  Thought Content:WDL  Hallucinations:None  Ideas of Reference:None  Suicidal Thoughts:No  Homicidal Thoughts:No   Sensorium  Memory:Immediate Fair; Recent Fair; Remote Fair  Judgment:Intact  Insight:Fair   Executive Functions  Concentration:Good  Attention Span:Good  Recall:Good  Fund of Knowledge:Fair  Language:Good   Psychomotor Activity  Psychomotor Activity:Normal   Assets  Assets:Communication Skills; Desire for Improvement; Housing; Physical Health; Social Support; Transportation   Sleep  Sleep:Fair  Number of hours: No data recorded  Physical Exam: Physical Exam Vitals and nursing note reviewed.  Constitutional:      Appearance: He is well-developed.  HENT:     Head: Normocephalic.  Eyes:     Pupils: Pupils are equal, round, and reactive to light.  Cardiovascular:     Rate and Rhythm: Normal rate.  Pulmonary:     Effort: Pulmonary effort is normal.  Musculoskeletal:        General: Normal range of motion.  Neurological:     Mental Status: He is alert and oriented to person, place, and time.  Review of Systems  Constitutional: Negative.   HENT: Negative.   Eyes: Negative.   Respiratory: Negative.   Cardiovascular: Negative.   Gastrointestinal: Negative.   Genitourinary: Negative.   Musculoskeletal: Negative.   Skin: Negative.    Neurological: Negative.   Endo/Heme/Allergies: Negative.    Blood pressure 127/70, pulse 76, temperature 97.7 F (36.5 C), temperature source Tympanic, height 5' 5.55" (1.665 m), weight 188 lb (85.3 kg), SpO2 100 %. Body mass index is 30.76 kg/m.  Musculoskeletal: Strength & Muscle Tone: within normal limits Gait & Station: normal Patient leans: N/A   [facility] MSE Discharge Disposition for Follow up and Recommendations: Based on my evaluation the patient does not appear to have an emergency medical condition and can be discharged with resources and follow up care in outpatient services for Medication Management and Individual Therapy   Gerlene Burdock Dierra Riesgo, FNP 12/05/2020, 1:40 PM

## 2020-12-05 NOTE — ED Notes (Signed)
Pt does not have a locker. 

## 2020-12-05 NOTE — ED Notes (Signed)
Blood drawn from pt's right arm ac. Had to redirect pt to stop moving arm and stop touching arm. Using 23g needle. Pressure dressing applied. No new issues noted. Pt tolerated well. Called for STAT lab pickup

## 2020-12-05 NOTE — ED Notes (Signed)
Educated pt on avs and medications. Verbalized understanding. Escorted pt to front lobby. Ambulated per self. No s/s pain, discomfort, or acute distress noted. A&O x4. Stable at time of d/c

## 2020-12-05 NOTE — ED Triage Notes (Signed)
Received Todd [name] at the [facility] with relatives and or friends. He stated increase aggression over the past 1-2 days. He had a confrontation with his parents and they want him to leave. He denied feeling SI/HI. He has been noncompliant with his medications for several months.

## 2020-12-05 NOTE — Discharge Instructions (Signed)

## 2021-01-17 ENCOUNTER — Telehealth: Payer: Self-pay | Admitting: Internal Medicine

## 2021-01-17 NOTE — Telephone Encounter (Signed)
Patient called asking for an appointment to request RX benztropine (COGENTIN) 0.5 MG tablet and risperiDONE (RISPERDAL) 2 MG tablet. Patient stated that he was prescribes those Rx back in December when he had a bad episode due to his bipolar disorder by a doctor at [facility]. However, he does not have any more refills and both doctors at the behavioral health are out until next week. Patient would like to have his prescriptions this week to prevent any further episodes. Please advise.

## 2021-01-17 NOTE — Telephone Encounter (Signed)
Patient has not been seen here since April of 2019. It does not appear he has been following with anyone at [facility] since mid 2020, so unlikely he has actually been taking medication regularly. He should go to [facility] ED and be seen and then make sure he follow up with his psychiatric provider. I would be happy to have him reestablish with me--can make and appt as available.

## 2021-01-18 NOTE — Telephone Encounter (Signed)
Spoke with mother of patient and explained to her the Doctor's response. Patient will call back to schedule an appointment to reestablish care. Patient mother understands that she can take patient to the [facility] ED in case of emergency.

## 2021-01-18 NOTE — Telephone Encounter (Signed)
Called patient and left a message asking to call back.  

## 2021-01-24 ENCOUNTER — Encounter (HOSPITAL_COMMUNITY): Payer: Self-pay | Admitting: Physician Assistant

## 2021-01-24 ENCOUNTER — Ambulatory Visit (INDEPENDENT_AMBULATORY_CARE_PROVIDER_SITE_OTHER): Payer: No Payment, Other | Admitting: Physician Assistant

## 2021-01-24 ENCOUNTER — Other Ambulatory Visit: Payer: Self-pay

## 2021-01-24 VITALS — BP 119/73 | HR 83 | Ht 65.5 in | Wt 196.0 lb

## 2021-01-24 DIAGNOSIS — F200 Paranoid schizophrenia: Secondary | ICD-10-CM

## 2021-01-24 MED ORDER — BENZTROPINE MESYLATE 0.5 MG PO TABS: 0.5000 mg | ORAL_TABLET | Freq: Two times a day (BID) | ORAL | 1 refills | Status: DC

## 2021-01-24 MED ORDER — BENZTROPINE MESYLATE 0.5 MG PO TABS: 0.5000 mg | ORAL_TABLET | Freq: Two times a day (BID) | ORAL | 0 refills | Status: DC

## 2021-01-24 MED ORDER — RISPERIDONE 2 MG PO TABS: 2.0000 mg | ORAL_TABLET | Freq: Two times a day (BID) | ORAL | 1 refills | Status: DC

## 2021-01-24 MED ORDER — RISPERIDONE 2 MG PO TABS: 2.0000 mg | ORAL_TABLET | Freq: Two times a day (BID) | ORAL | 0 refills | Status: DC

## 2021-01-24 NOTE — Progress Notes (Signed)
Psychiatric Initial Adult Assessment   Patient Identification: Todd [name] MRN:  [id number] Date of Evaluation:  01/24/2021 Referral Source: Behavioral Health Urgent Care Chief Complaint:   Chief Complaint    New Patient (Initial Visit)     Visit Diagnosis:    ICD-10-CM   1. Paranoid schizophrenia (HCC)  F20.0     History of Present Illness:   Todd [name] is a 25 year old male with a documented history of schizophrenia who presents to [facility] for new patient evaluation. During the encounter, patient was a poor historian and disclosed very little information regarding his mental health. Patient reports that he presents to [facility] for his medication. When questioning him further regarding the nature of his past psychiatric history, patient stated that he did not want to talk about his past and instead wanted to get his medications. Patient was asked what medications did he run out of to which he replied, "You should know."  Patient states that he was in rehab in the past but quickly retracted he statement and stated that he was in the hospital and needs medications. Patient does not remember why he was in the hospital in the past. Patient continues to ask for his refills throughout the entirety of the discussion. Patient was informed that he would get his refills but first he would need to answer some questions during the encounter. When asked if his last medication regimen was helpful patient stated that he is out of medications and would like to be on something better. Patient was informed that writer to need to know what medications he was on first and if they were helpful before placing him on something entirely different. Patient reports that he was on two medications in the past and that he had no issues with them.  Patient denies suicidal and homicidal ideations. He further denies auditory and visual  hallucinations. Patient endorses good sleep and good appetite. Patient denies current alcohol consumption but states that he has drank alcohol in the past. Patient denies current tobacco use and illicit drug use.  Patient was last seen at [facility] as a walk-in on 12/05/2020. During the assessment, it was determined that the patient has a documented history of schizophrenia and was admitted at [facility] on 03/19/2019 - 03/27/2019 and then again on 04/06/2019 - 04/14/2019. During the [facility] encounter, patient's family reported that he had been off his medications for 6 months and that he had been decompensating some. Patient reported that he had been off his medications and had no excuse as to why. Patient stated that he was willing to restart his medications. After reviewing patient's chart it was determined that patient had been prescribed the following medications: Abilify Maintena 400 mg MG every 30 day, Cogentin 0.5 mg 2 times daily, Risperdal 1 mg every morninf and 2 mg nightly, and Restoril 15 mg p.o. nightly. Patient agreed to start Risperdal and Cogentin. Patient was provided with 1 dose of Risperdal 2 mg and was provided with 7-day samples and 30-day prescriptions of Risperdal 2 mg p.o. twice daily and Cogentin 0.5 mg p.o. twice daily.  Patient was instructed to follow-up at open access with the [facility] and provided with dates and times for open access.  Associated Signs/Symptoms: Depression Symptoms:  difficulty concentrating, impaired memory, (Hypo) Manic Symptoms:  Irritable Mood, Anxiety Symptoms:  N/A Psychotic Symptoms:  Paranoia, PTSD Symptoms: Had a traumatic exposure:  Patient report no  past traumatic events Had a traumatic exposure in the last month:  N/A Re-experiencing:  None Hypervigilance:  No Hyperarousal:  Irritability/Anger Avoidance:  Decreased Interest/Participation Foreshortened Future  Past Psychiatric History: Paranoid  schizophrenia  Previous Psychotropic Medications: Yes   Substance Abuse History in the last 12 months:  No.  Consequences of Substance Abuse: NA  Past Medical History:  Past Medical History:  Diagnosis Date  . Bipolar 1 disorder (HCC)   . Headache    History reviewed. No pertinent surgical history.  Family Psychiatric History: Patient reports family history of psychiatric illness  Family History:  Family History  Problem Relation Age of Onset  . Hyperlipidemia Father   . Diabetes Maternal Grandmother   . Alcohol abuse Paternal Grandfather     Social History:   Social History   Socioeconomic History  . Marital status: Single    Spouse name: Not on file  . Number of children: 0  . Years of education: Not on file  . Highest education level: 12th grade  Occupational History  . 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 Smoking status: Former Smoker    Packs/day: 0.00  . Smokeless tobacco: Never Used  Vaping Use  . Vaping Use: Never used  Substance and Sexual Activity  . Alcohol use: Not Currently    Alcohol/week: 0.0 standard drinks    Comment: Beer and Tequila on weekends.  3 beers and 4 shots of Tequila over 2 weekends per month  . Drug use: Not Currently    Types: Marijuana    Comment: Marijuana:  starts and stops.  When smoking, daily.  Tested every 2 weeks due to taking prescription drug abuse--stopped in [place].  Goes throught 05/2016. Has used MJ since 6th grade.  Went to [employer] a lot daily.  Marland Kitchen Sexual activity: Not Currently    Partners: Female    Birth control/protection: Condom  Other Topics Concern  . Not on file  Social History Narrative   Originally from [place]   Came to [employer]. When 25 years old.     Lives with parents and 84 yo sister.    Social Determinants of Health   Financial Resource Strain: Not on file  Food Insecurity: Not on file  Transportation Needs: Not on file  Physical Activity: Not on file  Stress:  Not on file  Social Connections: Not on file    Additional Social History: Patient is a poor history and did no disclose much information  Allergies:  No Known Allergies  Metabolic Disorder Labs: Lab Results  Component Value Date   HGBA1C 5.5 12/05/2020   MPG 111.15 12/05/2020   MPG 108 06/01/2017   Lab Results  Component Value Date   PROLACTIN 31.5 (H) 06/01/2017   PROLACTIN 34.6 (H) 03/17/2016   Lab Results  Component Value Date   CHOL 188 12/05/2020   TRIG 137 12/05/2020   HDL 31 (L) 12/05/2020   CHOLHDL 6.1 12/05/2020   VLDL 27 12/05/2020   LDLCALC 130 (H) 12/05/2020   LDLCALC 131 (H) 06/01/2017   Lab Results  Component Value Date   TSH 4.558 (H) 12/05/2020    Therapeutic Level Labs: No results found for: LITHIUM No results found for: CBMZ No results found for: VALPROATE  Current Medications: Current Outpatient Medications  Medication Sig Dispense Refill  . benztropine (COGENTIN) 0.5 MG tablet Take 1 tablet (0.5 mg total) by mouth 2 (two) times daily. (Patient not taking: Reported on 01/24/2021) 60 tablet 0  .  risperiDONE (RISPERDAL) 2 MG tablet Take 1 tablet (2 mg total) by mouth 2 (two) times daily. (Patient not taking: Reported on 01/24/2021) 60 tablet 0   No current facility-administered medications for this visit.    Musculoskeletal: Strength & Muscle Tone: within normal limits Gait & Station: normal Patient leans: N/A  Psychiatric Specialty Exam: Review of Systems  Psychiatric/Behavioral: Positive for agitation and decreased concentration. Negative for dysphoric mood, hallucinations, self-injury, sleep disturbance and suicidal ideas. The patient is not nervous/anxious and is not hyperactive.     Blood pressure 119/73, pulse 83, height 5' 5.5" (1.664 m), weight 196 lb (88.9 kg).Body mass index is 32.12 kg/m.  General Appearance: Fairly Groomed  Eye Contact:  Good  Speech:  Clear and Coherent and Normal Rate  Volume:  Normal  Mood:  Irritable   Affect:  Inappropriate  Thought Process:  Coherent, Goal Directed and Descriptions of Associations: Intact  Orientation:  Full (Time, Place, and Person)  Thought Content:  Illogical and Tangential  Suicidal Thoughts:  No  Homicidal Thoughts:  No  Memory:  Immediate;   Fair Recent;   Fair Remote;   Fair  Judgement:  Poor  Insight:  Lacking  Psychomotor Activity:  Normal  Concentration:  Concentration: Fair and Attention Span: Good  Recall:  [employer] of Knowledge:Fair  Language: Good  Akathisia:  NA  Handed:  Right  AIMS (if indicated):  not done  Assets:  Communication Skills Housing Social Support  ADL's:  Intact  Cognition: Impaired,  Mild  Sleep:  Good   Screenings: AIMS   Flowsheet Row [facility]) [facility] Admission (Discharged) from OP [facility] Ad[facility] (Discharged) from 03/16/2016 in [facility]  AIMS Total Score 0 0 0 0    AUDIT   Flowsheet Row [facility]) [facility] Ad[facility] (Discharged) [facility] Admission (Discharged) from OP [facility] Admission (Discharged) from 03/16/2016 in [facility] INPATIENT BEHAVIORAL MEDICINE  Alcohol Use Disorder Identification Test Final Score (AUDIT) 0 0 0 6    PHQ2-9   Flowsheet Row Office Visit from 05/28/2016 in [facility] Office Visit from 06/22/2015 in [facility] at [facility] Total Score 2 0  PHQ-9 Total Score 5 -    Flowsheet Row [facility]) [facility] Admission (Discharged) [facility]  C-SSRS  RISK CATEGORY No Risk No Risk      Assessment and Plan:  Todd [name] is a 25 year old male with a documented history of schizophrenia who presents to [facility] for new patient evaluation. Patient was a poor historian and disclosed very little information regarding his past psychiatric history. Patient was last seen at [facility] as a walk-in on 12/05/2020. During the assessment, it was determined that the patient has a documented history of schizophrenia and was admitted at [facility] on 03/19/2019 - 03/27/2019 and then again on 04/06/2019 - 04/14/2019. After being assessed at [facility], patient was discharged on Risperdal 2 mg 2 times daily and Cogentin 0.5 mg 2 times daily. Patient will be placed back on these medications. Patient's medications will be  e-prescribed to pharmacy of choice.  1. Paranoid schizophrenia (HCC)  - benztropine (COGENTIN) 0.5 MG tablet; Take 1 tablet (0.5 mg total) by mouth 2 (two) times daily.  Dispense: 60 tablet; Refill: 1 - risperiDONE (RISPERDAL) 2 MG tablet; Take 1 tablet (2 mg total) by mouth 2 (two) times daily.  Dispense: 60 tablet; Refill: 1  Patient to follow up in 6 weeks.  Meta Hatchet, PA 2/8/202210:30 AM

## 2021-01-25 ENCOUNTER — Ambulatory Visit (HOSPITAL_COMMUNITY)
Admission: EM | Admit: 2021-01-25 | Discharge: 2021-01-25 | Disposition: A | Discharge status: Home / Self Care | Payer: No Payment, Other | Attending: Psychiatry | Admitting: Psychiatry

## 2021-01-25 ENCOUNTER — Other Ambulatory Visit: Payer: Self-pay

## 2021-01-25 DIAGNOSIS — F200 Paranoid schizophrenia: Secondary | ICD-10-CM | POA: Insufficient documentation

## 2021-01-25 NOTE — BH Assessment (Signed)
Patient presenting to [facility] voluntarily with concerns about receiving his medications. Patient reports having appointment yesterday with [facility] outpatient provider and as a result he was prescribed medications. Patient reports the medication was not at the pharmacy and he is here to see if the medications were prescribed. Per chart review patient was prescribed risperidone and benztropine and medications were sent to [facility] instead of [address] on [address]. TTS contacted [facility] to make sure medications were at the pharmacy. Information provided to patient and family. Resource provided for medication assistance.  Patient denies SI/HI/AVH and is not in crisis.

## 2021-01-25 NOTE — ED Provider Notes (Signed)
Behavioral Health Urgent Care Medical Screening Exam  Patient Name: Todd [name] MRN: [id number] Date of Evaluation: 01/25/21 Chief Complaint:   Diagnosis:  Final diagnoses:  Paranoid schizophrenia (HCC)    History of Present illness: Todd [name] is a 25 y.o. male.  Patient states "I just came here because I did not get my medications filled that were prescribed here yesterday."  Patient seen by outpatient psychiatry at [facility] behavioral health on yesterday.  Patient has been diagnosed with paranoid schizophrenia in the past.  Discharge medications yesterday included risperidone and benztropine.  Apparently there was some confusion regarding medication that should have been sent to [facility] but was instead sent to [facility].  Patient reports compliance with medications.  Patient resides in [place] with his parents and younger sister.  Patient denies access to weapons.  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  Patient denies alcohol and substance use.  Patient endorses average sleep and appetite.  Patient assessed by nurse practitioner.  Patient denies suicidal and homicidal ideations.  Patient denies history of suicide attempts, denies history of self-harm behaviors.  Patient denies both auditory and visual hallucinations.  There is no evidence of delusional thought content and no indication that patient is responding to internal stimuli.  Patient denies symptoms of paranoia.  Patient gives verbal consent to speak with his mother in the lobby.  TTS counselor spoke with patient's mother who denies concerns for patient safety.  Community health and wellness resources provided as filling medications can be challenging related to limited financial resources.  Psychiatric Specialty Exam  Presentation  General Appearance:Appropriate for Environment; Casual  Eye Contact:Good  Speech:Clear and Coherent; Normal  Rate  Speech Volume:Normal  Handedness:Right   Mood and Affect  Mood:Euthymic  Affect:Appropriate; Congruent   Thought Process  Thought Processes:Coherent; Goal Directed  Descriptions of Associations:Intact  Orientation:Full (Time, Place and Person)  Thought Content:Logical; WDL  Hallucinations:None  Ideas of Reference:None  Suicidal Thoughts:No  Homicidal Thoughts:No   Sensorium  Memory:Immediate Good; Recent Good; Remote Good  Judgment:Good  Insight:Good   Executive Functions  Concentration:Good  Attention Span:Good  Recall:Good  Fund of Knowledge:Good  Language:Good   Psychomotor Activity  Psychomotor Activity:Normal   Assets  Assets:Communication Skills; Desire for Improvement; Financial Resources/Insurance; Housing; Intimacy; Leisure Time; Physical Health; Resilience; Social Support; Talents/Skills; Transportation   Sleep  Sleep:Fair  Number of hours: No data recorded  Physical Exam: Physical Exam Vitals and nursing note reviewed.  Constitutional:      Appearance: He is well-developed.  HENT:     Head: Normocephalic.  Cardiovascular:     Rate and Rhythm: Normal rate.  Pulmonary:     Effort: Pulmonary effort is normal.  Neurological:     Mental Status: He is alert and oriented to person, place, and time.  Psychiatric:        Attention and Perception: Attention and perception normal.        Mood and Affect: Mood and affect normal.        Speech: Speech normal.        Behavior: Behavior normal. Behavior is cooperative.        Thought Content: Thought content normal.        Cognition and Memory: Cognition and memory normal.        Judgment: Judgment normal.    Review of Systems  Constitutional: Negative.   HENT: Negative.   Eyes: Negative.   Respiratory: Negative.   Cardiovascular: Negative.  Gastrointestinal: Negative.   Genitourinary: Negative.   Musculoskeletal: Negative.   Skin: Negative.   Neurological: Negative.    Endo/Heme/Allergies: Negative.   Psychiatric/Behavioral: Negative.    Blood pressure 118/68, pulse 80, temperature 98.8 F (37.1 C), temperature source Oral, resp. rate 18, SpO2 100 %. There is no height or weight on file to calculate BMI.  Musculoskeletal: Strength & Muscle Tone: within normal limits Gait & Station: normal Patient leans: N/A   [facility] MSE Discharge Disposition for Follow up and Recommendations: Based on my evaluation the patient does not appear to have an emergency medical condition and can be discharged with resources and follow up care in outpatient services for Medication Management and Individual Therapy  Patient reviewed with Dr. Bronwen Betters. Follow-up with established outpatient psychiatry at [facility].   Patrcia Dolly, FNP 01/25/2021, 6:36 PM

## 2021-01-25 NOTE — Discharge Instructions (Addendum)

## 2021-01-25 NOTE — Progress Notes (Signed)
Todd [name] was presentedwith his AVS, questions answered and he retrieved his personal belongings.

## 2021-03-07 ENCOUNTER — Encounter (HOSPITAL_COMMUNITY): Payer: No Payment, Other | Admitting: Physician Assistant

## 2021-04-11 ENCOUNTER — Ambulatory Visit (INDEPENDENT_AMBULATORY_CARE_PROVIDER_SITE_OTHER): Payer: No Payment, Other | Admitting: Physician Assistant

## 2021-04-11 ENCOUNTER — Other Ambulatory Visit: Payer: Self-pay

## 2021-04-11 ENCOUNTER — Encounter (HOSPITAL_COMMUNITY): Payer: Self-pay | Admitting: Physician Assistant

## 2021-04-11 DIAGNOSIS — F200 Paranoid schizophrenia: Secondary | ICD-10-CM | POA: Diagnosis not present

## 2021-04-11 MED ORDER — BENZTROPINE MESYLATE 0.5 MG PO TABS: 0.5000 mg | ORAL_TABLET | Freq: Two times a day (BID) | ORAL | 1 refills | Status: DC

## 2021-04-11 MED ORDER — RISPERIDONE 2 MG PO TABS: 2.0000 mg | ORAL_TABLET | Freq: Two times a day (BID) | ORAL | 1 refills | Status: DC

## 2021-04-11 NOTE — Progress Notes (Signed)
BH MD/PA/NP OP Progress Note  04/11/2021 5:11 PM Todd [name]  MRN:  [id number]  Chief Complaint: Follow up and medication management  HPI:   Todd [name] is a 25 year old male with a past psychiatric history significant for paranoid schizophrenia who presents to [facility] for follow-up and medication management.  Patient is currently being managed on the following medications:  Risperidone 2 mg two times daily Cogentin 0.5 mg 2 times daily  Patient reports no issues or concerns regarding his current medication regimen.  The only issue patient mentions during the encounter is that whenever he falls his risperidone pills they dissolve in his throat and leave a very bad aftertaste.  Despite experiencing the bad taste, patient reports no other issues with his medication and states that his mood and focus are more improved when he takes his risperidone.  Patient is requesting refills on both prescriptions following the conclusion of the encounter.  Patient denies any other issues or concerns at this time.  Patient is pleasant, calm, cooperative, and fully engaged in conversation during the encounter.  Patient reports that he is in a good mood.  Patient denies suicidal or homicidal ideations.  He further denies auditory or visual hallucinations.  Patient endorses good sleep and states that he has been receiving enough hours of sleep each night.  Patient endorses good/regular appetite and states that he eats full course meals each day.  Patient denies alcohol consumption, tobacco use, and illicit drug use.  Visit Diagnosis:    ICD-10-CM   1. Paranoid schizophrenia (HCC)  F20.0 risperiDONE (RISPERDAL) 2 MG tablet    benztropine (COGENTIN) 0.5 MG tablet    Past Psychiatric History:  Paranoid schizophrenia  Past Medical History:  Past Medical History:  Diagnosis Date  . Bipolar 1 disorder (HCC)   . Headache    History  reviewed. No pertinent surgical history.  Family Psychiatric History:  Patient reports family history of psychiatric illness  Family History:  Family History  Problem Relation Age of Onset  . Hyperlipidemia Father   . Diabetes Maternal Grandmother   . Alcohol abuse Paternal Grandfather     Social History:  Social History   Socioeconomic History  . Marital status: Single    Spouse name: Not on file  . Number of children: 0  . Years of education: Not on file  . Highest education level: 12th grade  Occupational History  . 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 Smoking status: Former Smoker    Packs/day: 0.00  . Smokeless tobacco: Never Used  Vaping Use  . Vaping Use: Never used  Substance and Sexual Activity  . Alcohol use: Not Currently    Alcohol/week: 0.0 standard drinks    Comment: Beer and Tequila on weekends.  3 beers and 4 shots of Tequila over 2 weekends per month  . Drug use: Not Currently    Types: Marijuana    Comment: Marijuana:  starts and stops.  When smoking, daily.  Tested every 2 weeks due to taking prescription drug abuse--stopped in [place].  Goes throught 05/2016. Has used MJ since 6th grade.  Went to [employer] a lot daily.  Marland Kitchen Sexual activity: Not Currently    Partners: Female    Birth control/protection: Condom  Other Topics Concern  . Not on file  Social History Narrative   Originally from [place]   Came to [employer]. When 25 years old.     Lives  with parents and 32 yo sister.    Social Determinants of Health   Financial Resource Strain: Not on file  Food Insecurity: Not on file  Transportation Needs: Not on file  Physical Activity: Not on file  Stress: Not on file  Social Connections: Not on file    Allergies: No Known Allergies  Metabolic Disorder Labs: Lab Results  Component Value Date   HGBA1C 5.5 12/05/2020   MPG 111.15 12/05/2020   MPG 108 06/01/2017   Lab Results  Component Value Date   PROLACTIN  31.5 (H) 06/01/2017   PROLACTIN 34.6 (H) 03/17/2016   Lab Results  Component Value Date   CHOL 188 12/05/2020   TRIG 137 12/05/2020   HDL 31 (L) 12/05/2020   CHOLHDL 6.1 12/05/2020   VLDL 27 12/05/2020   LDLCALC 130 (H) 12/05/2020   LDLCALC 131 (H) 06/01/2017   Lab Results  Component Value Date   TSH 4.558 (H) 12/05/2020   TSH 3.342 06/01/2017    Therapeutic Level Labs: No results found for: LITHIUM No results found for: VALPROATE No components found for:  CBMZ  Current Medications: Current Outpatient Medications  Medication Sig Dispense Refill  . benztropine (COGENTIN) 0.5 MG tablet Take 1 tablet (0.5 mg total) by mouth 2 (two) times daily. 60 tablet 1  . risperiDONE (RISPERDAL) 2 MG tablet Take 1 tablet (2 mg total) by mouth 2 (two) times daily. 60 tablet 1   No current facility-administered medications for this visit.     Musculoskeletal: Strength & Muscle Tone: within normal limits Gait & Station: normal Patient leans: N/A  Psychiatric Specialty Exam: Review of Systems  Psychiatric/Behavioral: Negative for decreased concentration, dysphoric mood, hallucinations, self-injury, sleep disturbance and suicidal ideas. The patient is not nervous/anxious and is not hyperactive.     There were no vitals taken for this visit.There is no height or weight on file to calculate BMI.  General Appearance: Fairly Groomed  Eye Contact:  Good  Speech:  Clear and Coherent and Normal Rate  Volume:  Normal  Mood:  Euthymic  Affect:  Appropriate  Thought Process:  Coherent and Descriptions of Associations: Intact  Orientation:  Full (Time, Place, and Person)  Thought Content: WDL   Suicidal Thoughts:  No  Homicidal Thoughts:  No  Memory:  Immediate;   Fair Recent;   Fair Remote;   Fair  Judgement:  Good  Insight:  Good  Psychomotor Activity:  Normal  Concentration:  Concentration: Good and Attention Span: Good  Recall:  [employer] of Knowledge: Fair  Language: Good   Akathisia:  NA  Handed:  Right  AIMS (if indicated): not done  Assets:  Communication Skills Housing Social Support Vocational/Educational  ADL's:  Intact  Cognition: WNL  Sleep:  Good   Screenings: AIMS   Flowsheet Row [facility]) [facility] Ad[facility] (Discharged) from OP [facility] (Discharged) from 03/16/2016 in [facility]  AIMS Total Score 0 0 0 0    AUDIT   Flowsheet Row [facility]) [facility] ([facility]) [facility] Ad[facility] (Discharged) from OP [facility] Admission (Discharged) from 03/16/2016 in [facility]PATIENT BE[facility]AVIORAL MEDICINE  Alcohol Use Disorder Identification Test Final Score (AUDIT) 0 0 0  6    GAD-7   Flowsheet Row Clinical Support from 04/11/2021 in [facility]  Total GAD-7 Score 0    PHQ2-9   Flowsheet Row Clinical Support from 04/11/2021 in [facility] ED from 01/25/2021 in [facility] Office Visit from 05/28/2016 in [facility] Office Visit from 06/22/2015 in [facility] at [facility] Total Score 0 0 2 0  PHQ-9 Total Score -- -- 5 --    Flowsheet Row Clinical Support from 04/11/2021 in [facility] ED from 01/25/2021 in [facility] (Discharged[facility]  C-SSRS RISK CATEGORY No Risk No Risk No Risk       Assessment and Plan:   Larenzo Caples is a 25 year old male with a past psychiatric history significant for  paranoid schizophrenia who presents to [facility] for follow-up and medication management.  Patient denies any issues or concerns regarding his current medication regimen.  Patient does not note any significant psychiatric symptoms he is experiencing during today's encounter.  Patient is requesting medication refills following the conclusion of the encounter.  Patient's medications will be e-prescribed to pharmacy of choice.  1. Paranoid schizophrenia (HCC)  - risperiDONE (RISPERDAL) 2 MG tablet; Take 1 tablet (2 mg total) by mouth 2 (two) times daily.  Dispense: 60 tablet; Refill: 1 - benztropine (COGENTIN) 0.5 MG tablet; Take 1 tablet (0.5 mg total) by mouth 2 (two) times daily.  Dispense: 60 tablet; Refill: 1  Patient to follow up in 2 months  Meta Hatchet, PA 04/11/2021, 5:11 PM

## 2021-06-13 ENCOUNTER — Other Ambulatory Visit: Payer: Self-pay

## 2021-06-13 ENCOUNTER — Encounter (HOSPITAL_COMMUNITY): Payer: Self-pay | Admitting: Physician Assistant

## 2021-06-13 ENCOUNTER — Ambulatory Visit (INDEPENDENT_AMBULATORY_CARE_PROVIDER_SITE_OTHER): Payer: No Payment, Other | Admitting: Physician Assistant

## 2021-06-13 VITALS — BP 108/66 | HR 87 | Ht 65.5 in | Wt 210.0 lb

## 2021-06-13 DIAGNOSIS — F200 Paranoid schizophrenia: Secondary | ICD-10-CM | POA: Diagnosis not present

## 2021-06-13 DIAGNOSIS — F331 Major depressive disorder, recurrent, moderate: Secondary | ICD-10-CM | POA: Diagnosis not present

## 2021-06-13 MED ORDER — SERTRALINE HCL 50 MG PO TABS: 50.0000 mg | ORAL_TABLET | Freq: Every day | ORAL | 1 refills | Status: DC

## 2021-06-13 MED ORDER — BENZTROPINE MESYLATE 0.5 MG PO TABS: 0.5000 mg | ORAL_TABLET | Freq: Two times a day (BID) | ORAL | 1 refills | Status: DC

## 2021-06-13 MED ORDER — RISPERIDONE 2 MG PO TABS: 2.0000 mg | ORAL_TABLET | Freq: Two times a day (BID) | ORAL | 1 refills | Status: DC

## 2021-06-16 ENCOUNTER — Encounter (HOSPITAL_COMMUNITY): Payer: Self-pay | Admitting: Physician Assistant

## 2021-06-16 NOTE — Progress Notes (Signed)
BH MD/PA/NP OP Progress Note  06/16/2021 10:35 PM Todd [name] Todd [name]  MRN:  [id number]  Chief Complaint:  Chief Complaint   Medication Management    HPI:   Todd [name] is a 25 year old male with a past psychiatric history significant for paranoid schizophrenia who presents to [facility] for follow-up and medication management.  Patient is currently being managed on the following medications:  Risperidone 2 mg 2 times daily Cogentin 0.5 mg 2 times daily  Patient reports that he has been experiencing a heavy sensation in his eyes that he feels more tired.  Patient reports no other issues or concerns regarding his current medication regimen.  Patient denies a need for dosage adjustments at this time and is requesting refills following the conclusion of the encounter.  Patient denies experiencing anxiety or depressive symptoms.  Patient denies any new stressors at this time.  A PHQ-9 screen was performed with the patient scoring a 14.  While screening using the PHQ-9 screen, patient endorsed the following symptoms: depressed mood, lack of interest in activities, difficulty sleeping, fatigue, difficulty concentrating, and thoughts that he would be better off dead.  Patient is pleasant, calm, cooperative, and fully engaged in conversation during the encounter.  Patient reports that he is feeling a little tired.  Patient denies suicidal or homicidal ideations.  He further denies auditory or visual hallucinations but states that he occasionally hears buzzing noises in his ear.  Patient endorses really good sleep.  Patient endorses good appetite and eats on average 3 meals per day.  Patient denies alcohol consumption, tobacco use, and illicit drug use.  Visit Diagnosis:    ICD-10-CM   1. Moderate episode of recurrent major depressive disorder (HCC)  F33.1 sertraline (ZOLOFT) 50 MG tablet    2. Paranoid schizophrenia (HCC)  F20.0  risperiDONE (RISPERDAL) 2 MG tablet    benztropine (COGENTIN) 0.5 MG tablet      Past Psychiatric History:  Paranoid schizophrenia  Past Medical History:  Past Medical History:  Diagnosis Date   Bipolar 1 disorder (HCC)    Headache    History reviewed. No pertinent surgical history.  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  3 beers and 4 shots of Tequila over 2 weekends per month   Drug use: Not Currently    Types: Marijuana    Comment: Marijuana:  starts and stops.  When smoking, daily.  Tested every 2 weeks due to taking prescription drug abuse--stopped in [place].  Goes throught 05/2016. Has used MJ since 6th grade.  Went to [employer]  a lot daily.   Sexual activity: Not Currently    Partners: Female    Birth control/protection: Condom  Other Topics Concern   Not on file  Social History Narrative   Originally from [place]   Came to [employer]. When 25 years old.     Lives with parents and 72 yo sister.    Social Determinants of Health   Financial Resource Strain: Not on file  Food Insecurity: Not on file  Transportation Needs: Not on file  Physical Activity: Not on file  Stress:  Not on file  Social Connections: Not on file    Allergies: No Known Allergies  Metabolic Disorder Labs: Lab Results  Component Value Date   HGBA1C 5.5 12/05/2020   MPG 111.15 12/05/2020   MPG 108 06/01/2017   Lab Results  Component Value Date   PROLACTIN 31.5 (H) 06/01/2017   PROLACTIN 34.6 (H) 03/17/2016   Lab Results  Component Value Date   CHOL 188 12/05/2020   TRIG 137 12/05/2020   HDL 31 (L) 12/05/2020   CHOLHDL 6.1 12/05/2020   VLDL 27 12/05/2020   LDLCALC 130 (H) 12/05/2020   LDLCALC 131 (H) 06/01/2017   Lab Results  Component Value Date   TSH 4.558 (H) 12/05/2020   TSH 3.342 06/01/2017    Therapeutic Level Labs: No results found for: LITHIUM No results found for: VALPROATE No components found for:  CBMZ  Current Medications: Current Outpatient Medications  Medication Sig Dispense Refill   sertraline (ZOLOFT) 50 MG tablet Take 1 tablet (50 mg total) by mouth daily. 30 tablet 1   benztropine (COGENTIN) 0.5 MG tablet Take 1 tablet (0.5 mg total) by mouth 2 (two) times daily. 60 tablet 1   risperiDONE (RISPERDAL) 2 MG tablet Take 1 tablet (2 mg total) by mouth 2 (two) times daily. 60 tablet 1   No current facility-administered medications for this visit.     Musculoskeletal: Strength & Muscle Tone: within normal limits Gait & Station: normal Patient leans: N/A  Psychiatric Specialty Exam: Review of Systems  Psychiatric/Behavioral:  Negative for decreased concentration, dysphoric mood, hallucinations, self-injury, sleep disturbance and suicidal ideas. The patient is not nervous/anxious and is not hyperactive.    Blood pressure 108/66, pulse 87, height 5' 5.5" (1.664 m), weight 210 lb (95.3 kg), SpO2 100 %.Body mass index is 34.41 kg/m.  General Appearance: Well Groomed  Eye Contact:  Good  Speech:  Clear and Coherent and Normal Rate  Volume:  Normal  Mood:  Euthymic  Affect:  Appropriate  Thought Process:  Coherent, Goal Directed, and Descriptions  of Associations: Intact  Orientation:  Full (Time, Place, and Person)  Thought Content: WDL   Suicidal Thoughts:  No  Homicidal Thoughts:  No  Memory:  Immediate;   Fair Recent;   Fair Remote;   Fair  Judgement:  Good  Insight:  Good  Psychomotor Activity:  Normal  Concentration:  Concentration: Good and Attention Span: Good  Recall:  [employer] of Knowledge: Fair  Language: Good  Akathisia:  NA  Handed:  Right  AIMS (if indicated): not done  Assets:  Communication Skills Housing Social Support Vocational/Educational  ADL's:  Intact  Cognition: WNL  Sleep:  Good   Screenings: AIMS    Flowsheet Row [facility] (Dis[facility]) [facility] Ad[facility] (Discharged) from OP [facility] Admission (Discharged) from  03/16/2016 in [facility]PAT[facility]AL [facility]ICINE  AIMS Total Score 0 0 0 0      AUDIT    Flowsheet Row Ad[facility]) [facility] ([facility] (Discharged) from OP [facility] Admission (Discharged) from 03/16/2016 in [facility]AL MEDICINE  Alcohol Use Disorder Identification Test Final Score (AUDIT) 0 0 0 6      GAD-7    Flowsheet Row Clinical Support from 06/13/2021 in [facility] Clinical Support from 04/11/2021 in [facility]  Total GAD-7 Score 0 0      PHQ2-9    Flowsheet Row Clinical Support from 06/13/2021 in [facility] Clinical Support from 04/11/2021 in [facility] ED from 01/25/2021 in [facility] Office Visit from  05/28/2016 in [facility] Office Visit from 06/22/2015 in Primary Care at [facility] Total Score 2 0 0 2 0  PHQ-9 Total Score 14 -- -- 5 --      Flowsheet Row Clinical Support from 06/13/2021 in [facility] Clinical Support from 04/11/2021 in [facility] ED from 01/25/2021 in [facility]  C-SSRS RISK CATEGORY No Risk No Risk No Risk        Assessment and Plan:   Todd [name] is a 25 year old male with a past psychiatric history significant for paranoid schizophrenia who presents to [facility] for follow-up and medication management.  Patient endorses the following depressive symptoms: depressed mood, lack of interest in activities, difficulty sleeping, fatigue, difficulty concentrating, and thoughts that he would be better off dead.  Patient was recommended sertraline 50 mg daily for the management of his depressive symptoms.  Patient was agreeable to recommendation.  Patient's medications to be e-prescribed to pharmacy of choice.  1. Paranoid schizophrenia (HCC)  - risperiDONE (RISPERDAL) 2 MG tablet; Take 1 tablet (2 mg total) by mouth 2 (two) times daily.  Dispense: 60 tablet; Refill: 1 - benztropine (COGENTIN) 0.5 MG tablet; Take 1 tablet (0.5 mg total) by mouth 2 (two) times daily.  Dispense: 60 tablet; Refill: 1  2. Moderate episode of recurrent major depressive disorder (HCC)  - sertraline (ZOLOFT) 50 MG tablet; Take 1 tablet (50 mg total) by mouth daily.  Dispense: 30 tablet; Refill: 1  Patient to follow up in 2 months Provider spent a total of 20 minutes with the patient last reviewing patient's chart  Meta Hatchet, PA 06/16/2021, 10:35 PM

## 2021-08-15 ENCOUNTER — Other Ambulatory Visit: Payer: Self-pay

## 2021-08-15 ENCOUNTER — Ambulatory Visit (INDEPENDENT_AMBULATORY_CARE_PROVIDER_SITE_OTHER): Payer: No Payment, Other | Admitting: Physician Assistant

## 2021-08-15 ENCOUNTER — Encounter (HOSPITAL_COMMUNITY): Payer: Self-pay | Admitting: Physician Assistant

## 2021-08-15 DIAGNOSIS — F330 Major depressive disorder, recurrent, mild: Secondary | ICD-10-CM

## 2021-08-15 DIAGNOSIS — F200 Paranoid schizophrenia: Secondary | ICD-10-CM

## 2021-08-15 MED ORDER — RISPERIDONE 2 MG PO TABS: 2.0000 mg | ORAL_TABLET | Freq: Two times a day (BID) | ORAL | 1 refills | Status: DC

## 2021-08-15 MED ORDER — SERTRALINE HCL 50 MG PO TABS: 50.0000 mg | ORAL_TABLET | Freq: Every day | ORAL | 1 refills | Status: DC

## 2021-08-15 MED ORDER — BENZTROPINE MESYLATE 0.5 MG PO TABS: 0.5000 mg | ORAL_TABLET | Freq: Two times a day (BID) | ORAL | 1 refills | Status: DC

## 2021-08-15 NOTE — Progress Notes (Signed)
BH MD/PA/NP OP Progress Note  08/15/2021 6:24 PM Todd [name]  MRN:  [id number]  Chief Complaint: Follow up and medication management  HPI:     Visit Diagnosis:    ICD-10-CM   1. Paranoid schizophrenia (HCC)  F20.0 benztropine (COGENTIN) 0.5 MG tablet    risperiDONE (RISPERDAL) 2 MG tablet    2. Mild episode of recurrent major depressive disorder (HCC)  F33.0 sertraline (ZOLOFT) 50 MG tablet      Past Psychiatric History:  Paranoid schizophrenia Major depressive disorder  Past Medical History:  Past Medical History:  Diagnosis Date   Bipolar 1 disorder (HCC)    Headache    History reviewed. No pertinent surgical history.  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  3 beers and 4 shots of Tequila over 2 weekends per month   Drug use: Not Currently    Types: Marijuana    Comment: Marijuana:  starts and stops.  When smoking, daily.  Tested every 2 weeks due to taking prescription drug abuse--stopped in [place].  Goes throught 05/2016. Has used MJ since 6th grade.  Went to [employer] a lot daily.   Sexual activity: Not Currently    Partners: Female    Birth control/protection:  Condom  Other Topics Concern   Not on file  Social History Narrative   Originally from [place]   Came to [employer]. When 25 years old.     Lives with parents and 70 yo sister.    Social Determinants of Health   Financial Resource Strain: Not on file  Food Insecurity: Not on file  Transportation Needs: Not on file  Physical Activity: Not on file  Stress: Not on file  Social Connections: Not on file    Allergies: No Known Allergies  Metabolic Disorder Labs: Lab Results  Component Value Date   HGBA1C 5.5 12/05/2020   MPG 111.15 12/05/2020   MPG 108 06/01/2017   Lab Results  Component Value Date   PROLACTIN 31.5 (H) 06/01/2017   PROLACTIN 34.6 (H) 03/17/2016   Lab Results  Component Value Date   CHOL 188 12/05/2020   TRIG 137 12/05/2020   HDL 31 (L) 12/05/2020   CHOLHDL 6.1 12/05/2020   VLDL 27 12/05/2020   LDLCALC 130 (H) 12/05/2020   LDLCALC 131 (H) 06/01/2017   Lab Results  Component Value Date   TSH 4.558 (H) 12/05/2020   TSH 3.342 06/01/2017    Therapeutic Level Labs: No results found for: LITHIUM No results found for: VALPROATE No components found for:  CBMZ  Current Medications: Current Outpatient Medications  Medication Sig Dispense Refill   benztropine (  COGENTIN) 0.5 MG tablet Take 1 tablet (0.5 mg total) by mouth 2 (two) times daily. 60 tablet 1   risperiDONE (RISPERDAL) 2 MG tablet Take 1 tablet (2 mg total) by mouth 2 (two) times daily. 60 tablet 1   sertraline (ZOLOFT) 50 MG tablet Take 1 tablet (50 mg total) by mouth daily. 30 tablet 1   No current facility-administered medications for this visit.     Musculoskeletal: Strength & Muscle Tone: within normal limits Gait & Station: normal Patient leans: N/A  Psychiatric Specialty Exam: Review of Systems  Psychiatric/Behavioral:  Negative for decreased concentration, dysphoric mood, hallucinations, self-injury, sleep disturbance and suicidal ideas. The patient is not nervous/anxious and is not  hyperactive.    Blood pressure 107/63, pulse 82, height 5\' 5"  (1.651 m), weight 217 lb (98.4 kg), SpO2 99 %.Body mass index is 36.11 kg/m.  General Appearance: Well Groomed  Eye Contact:  Good  Speech:  Clear and Coherent and Normal Rate  Volume:  Normal  Mood:  Euthymic  Affect:  Appropriate  Thought Process:  Coherent and Descriptions of Associations: Intact  Orientation:  Full (Time, Place, and Person)  Thought Content: WDL   Suicidal Thoughts:  No  Homicidal Thoughts:  No  Memory:  Immediate;   Fair Recent;   Fair Remote;   Fair  Judgement:  Good  Insight:  Good  Psychomotor Activity:  Normal  Concentration:  Concentration: Good and Attention Span: Good  Recall:  of Knowledge: Fair  Language: Good  Akathisia:  NA  Handed:  Right  AIMS (if indicated): not done  Assets:  [employer] Social Support Vocational/Educational  ADL's:  Intact  Cognition: WNL  Sleep:  Good   Screenings: AIMS    Flowsheet Row [facility]) [facility] Ad[facility] (Discharged) from OP [facility] (Discharged) from 03[facility]16/2016 in [facility]  AIMS Total Score 0 0 0 0      AUDIT    Flowsheet Row [facility]) [facility] (Dis[facility]) [facility] Ad[facility] (Discharged) from OP Visit [facility] Admission (Discharged) from 03/16/2016 in [facility]PAT[facility] BE[facility]ICINE  Alcohol Use Disorder Identification Test Final Score (AUDIT) 0 0 0 6      GAD-7    Flowsheet Row Office Visit from 08/15/2021 in [facility] Office Visit from 06/13/2021  in [facility] Clinical Support from 04/11/2021 in [facility]  Total GAD-7 Score 0 0 0      PHQ2-9    Flowsheet Row Office Visit from 08/15/2021 in [facility] Office Visit from 06/13/2021 in [facility] Clinical Support from 04/11/2021 in [facility] from 01/25/2021 in [facility] Office Visit from 05/28/2016 in [facility]  PHQ-2 Total Score 0 2 0 0 2  PHQ-9 Total Score -- 14 -- -- 5      Flowsheet Row Office Visit from 08/15/2021 in [facility] Office Visit from 06/13/2021 in [facility] Clinical Support from 04/11/2021 in [facility]  C-SSRS RISK CATEGORY No Risk No Risk No Risk  Assessment and Plan:     1. Paranoid schizophrenia (HCC)  - benztropine (COGENTIN) 0.5 MG tablet; Take 1 tablet (0.5 mg total) by mouth 2 (two) times daily.  Dispense: 60 tablet; Refill: 1 - risperiDONE (RISPERDAL) 2 MG tablet; Take 1 tablet (2 mg total) by mouth 2 (two) times daily.  Dispense: 60 tablet; Refill: 1  2. Mild episode of recurrent major depressive disorder (HCC)  - sertraline (ZOLOFT) 50 MG tablet; Take 1 tablet (50 mg total) by mouth daily.  Dispense: 30 tablet; Refill: 1  Patient to follow up in 3 months Provider spent a total of 20 minutes with the patient/reviewing patient's chart  Meta Hatchet, PA 08/15/2021, 6:24 PM

## 2021-11-14 ENCOUNTER — Other Ambulatory Visit: Payer: Self-pay

## 2021-11-14 ENCOUNTER — Ambulatory Visit (INDEPENDENT_AMBULATORY_CARE_PROVIDER_SITE_OTHER): Payer: No Payment, Other | Admitting: Physician Assistant

## 2021-11-14 ENCOUNTER — Encounter (HOSPITAL_COMMUNITY): Payer: Self-pay | Admitting: Physician Assistant

## 2021-11-14 DIAGNOSIS — F330 Major depressive disorder, recurrent, mild: Secondary | ICD-10-CM

## 2021-11-14 DIAGNOSIS — F200 Paranoid schizophrenia: Secondary | ICD-10-CM | POA: Diagnosis not present

## 2021-11-14 MED ORDER — RISPERIDONE 2 MG PO TABS: 2.0000 mg | ORAL_TABLET | Freq: Two times a day (BID) | ORAL | 2 refills | Status: DC

## 2021-11-14 MED ORDER — BENZTROPINE MESYLATE 0.5 MG PO TABS: 0.5000 mg | ORAL_TABLET | Freq: Two times a day (BID) | ORAL | 2 refills | Status: DC

## 2021-11-14 MED ORDER — SERTRALINE HCL 50 MG PO TABS: 50.0000 mg | ORAL_TABLET | Freq: Every day | ORAL | 2 refills | Status: DC

## 2021-11-14 NOTE — Progress Notes (Signed)
BH MD/PA/NP OP Progress Note  11/14/2021 10:19 AM Todd [name]  MRN:  [id number]  Chief Complaint:  Chief Complaint   Medication Management    HPI:   Todd [name] is a 25 year old male with a past psychiatric history significant for paranoid schizophrenia and major depressive disorder who presents to [facility] for follow-up and medication management.  He is currently being managed on the following medications:  Risperidone 2 mg 2 times daily Benztropine 0.5 mg 2 times daily Sertraline 50 mg daily  Patient reports no issues or concerns regarding his current medication regimen.  Patient denies the need for dosage adjustments at this time and is requesting refills on all his medications following the conclusion of the encounter.  Patient reports that his mood has been all right and denies experiencing any depressive episodes.  Patient further denies anxiety nor does he endorse new stressors.  Patient is alert and oriented x4, calm, cooperative, and fully engaged in conversation during the encounter.  Patient endorses all right mood.  Patient denies suicidal or homicidal ideations.  He further denies auditory or visual hallucinations and does not appear to be responding to internal/external stimuli.  Patient endorses good sleep.  Patient endorses good appetite and eats on average 2-3 meals per day.  Patient denies alcohol consumption, tobacco use, and illicit drug use.  Visit Diagnosis:    ICD-10-CM   1. Paranoid schizophrenia (HCC)  F20.0 risperiDONE (RISPERDAL) 2 MG tablet    benztropine (COGENTIN) 0.5 MG tablet    2. Mild episode of recurrent major depressive disorder (HCC)  F33.0 sertraline (ZOLOFT) 50 MG tablet      Past Psychiatric History:  Paranoid schizophrenia Major depressive disorder  Past Medical History:  Past Medical History:  Diagnosis Date   Bipolar 1 disorder (HCC)    Headache    History  reviewed. No pertinent surgical history.  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  3 beers and 4 shots of Tequila over 2 weekends per month   Drug use: Not Currently    Types: Marijuana    Comment: Marijuana:  starts and stops.  When smoking, daily.  Tested every 2 weeks due to taking prescription drug abuse--stopped in [place].  Goes throught 05/2016. Has used MJ since 6th grade.  Went to [employer] a lot daily.   Sexual activity: Not Currently    Partners: Female    Birth control/protection: Condom  Other Topics Concern   Not on file  Social History Narrative   Originally from [place]   Came to [employer]. When 25 years old.  Lives with parents and 15 yo sister.    Social Determinants of Health   Financial Resource Strain: Not on file  Food Insecurity: Not on file  Transportation Needs: Not on file  Physical Activity: Not on file  Stress: Not on file  Social Connections: Not on file    Allergies: No Known Allergies  Metabolic Disorder Labs: Lab Results  Component Value Date   HGBA1C 5.5 12/05/2020   MPG 111.15 12/05/2020   MPG 108 06/01/2017   Lab Results  Component Value Date   PROLACTIN 31.5  (H) 06/01/2017   PROLACTIN 34.6 (H) 03/17/2016   Lab Results  Component Value Date   CHOL 188 12/05/2020   TRIG 137 12/05/2020   HDL 31 (L) 12/05/2020   CHOLHDL 6.1 12/05/2020   VLDL 27 12/05/2020   LDLCALC 130 (H) 12/05/2020   LDLCALC 131 (H) 06/01/2017   Lab Results  Component Value Date   TSH 4.558 (H) 12/05/2020   TSH 3.342 06/01/2017    Therapeutic Level Labs: No results found for: LITHIUM No results found for: VALPROATE No components found for:  CBMZ  Current Medications: Current Outpatient Medications  Medication Sig Dispense Refill   benztropine (COGENTIN) 0.5 MG tablet Take 1 tablet (0.5 mg total) by mouth 2 (two) times daily. 60 tablet 2   risperiDONE (RISPERDAL) 2 MG tablet Take 1 tablet (2 mg total) by mouth 2 (two) times daily. 60 tablet 2   sertraline (ZOLOFT) 50 MG tablet Take 1 tablet (50 mg total) by mouth daily. 30 tablet 2   No current facility-administered medications for this visit.     Musculoskeletal: Strength & Muscle Tone: within normal limits Gait & Station: normal Patient leans: N/A  Psychiatric Specialty Exam: Review of Systems  Psychiatric/Behavioral:  Negative for decreased concentration, dysphoric mood, hallucinations, self-injury, sleep disturbance and suicidal ideas. The patient is not nervous/anxious and is not hyperactive.    Blood pressure 113/62, pulse 98, height 5\' 5"  (1.651 m), weight 222 lb (100.7 kg), SpO2 96 %.Body mass index is 36.94 kg/m.  General Appearance: Well Groomed  Eye Contact:  Good  Speech:  Clear and Coherent and Normal Rate  Volume:  Normal  Mood:  Euthymic  Affect:  Appropriate  Thought Process:  Coherent and Descriptions of Associations: Intact  Orientation:  Full (Time, Place, and Person)  Thought Content: WDL   Suicidal Thoughts:  No  Homicidal Thoughts:  No  Memory:  Immediate;   Fair Recent;   Fair Remote;   Fair  Judgement:  Good  Insight:  Good  Psychomotor Activity:  Normal  Concentration:   Concentration: Good and Attention Span: Good  Recall:  of Knowledge: Fair  Language: Good  Akathisia:  NA  Handed:  Right  AIMS (if indicated): not done  Assets:  Communication Skills Housing Social Support Vocational/Educational  ADL's:  Intact  Cognition: WNL  Sleep:  Good   Screenings: AIMS    Flowsheet Row [facility] (Dis[facility]) [facility] Ad[facility] (Discharged[facility] from OP [facility] Admission (Discharged) from 03/16/2016 in [facility] INPATIENT BE[facility]AVIORAL MEDICINE  AIMS Total Score 0 0 0 0      AUDIT    Flowsheet Row Ad[facility]) [facility] Ad[facility] (Discharged) [facility] Ad[facility] (Discharged) from OP Visit from  [facility] Admission (Discharged) from 03/16/2016 in [facility] INPATIENT BEHAVIORAL MEDICINE  Alcohol Use Disorder Identification Test Final Score (AUDIT) 0 0 0 6      GAD-7    Flowsheet Row Clinical Support from 11/14/2021 in [facility] Office Visit from 08/15/2021 in [facility] Office Visit from 06/13/2021 in [facility] Clinical Support from 04/11/2021 in [facility]  Total GAD-7 Score 0 0 0 0      PHQ2-9    Flowsheet Row Clinical Support from 11/14/2021 in [facility] Office Visit from 08/15/2021 in [facility] Office Visit from 06/13/2021 in [facility] Clinical Support from 04/11/2021 in [facility] ED from 01/25/2021 in [facility]  PHQ-2 Total Score 0 0  2 0 0  PHQ-9 Total Score -- -- 14 -- --      Flowsheet Row Clinical Support from 11/14/2021 in [facility] Office Visit from 08/15/2021 in [facility] Office Visit from 06/13/2021 in [facility]  C-SSRS RISK CATEGORY No Risk No Risk No Risk        Assessment and Plan:   Todd [name] is a 25 year old male with a past psychiatric history significant for paranoid schizophrenia and major depressive disorder who presents to [facility] for follow-up and medication management.  Patient reports no issues or concerns regarding his current medication regimen.  Patient denies the need for dosage adjustments at this time and is requesting no refills on his medications following the conclusion of the encounter.  1. Paranoid schizophrenia (HCC)  - risperiDONE (RISPERDAL) 2 MG tablet; Take 1 tablet (2 mg total) by mouth 2 (two) times daily.  Dispense: 60 tablet; Refill: 2 - benztropine (COGENTIN) 0.5 MG tablet; Take 1 tablet (0.5 mg total) by mouth 2 (two) times daily.  Dispense: 60 tablet; Refill: 2  2. Mild episode of recurrent major depressive disorder (HCC)  - sertraline (ZOLOFT) 50 MG tablet; Take 1 tablet (50 mg total) by mouth daily.  Dispense: 30 tablet; Refill: 2  Patient to follow up in 3 months Provider spent as total of 14 minutes with the patient/reviewing the patient's chart  Meta Hatchet, PA 11/14/2021, 10:19 AM

## 2022-02-13 ENCOUNTER — Other Ambulatory Visit: Payer: Self-pay

## 2022-02-13 ENCOUNTER — Encounter (HOSPITAL_COMMUNITY): Payer: Self-pay | Admitting: Physician Assistant

## 2022-02-13 ENCOUNTER — Ambulatory Visit (INDEPENDENT_AMBULATORY_CARE_PROVIDER_SITE_OTHER): Payer: No Payment, Other | Admitting: Physician Assistant

## 2022-02-13 DIAGNOSIS — F330 Major depressive disorder, recurrent, mild: Secondary | ICD-10-CM

## 2022-02-13 DIAGNOSIS — F200 Paranoid schizophrenia: Secondary | ICD-10-CM

## 2022-02-13 MED ORDER — SERTRALINE HCL 50 MG PO TABS: 50.0000 mg | ORAL_TABLET | Freq: Every day | ORAL | 2 refills | Status: AC

## 2022-02-13 MED ORDER — BENZTROPINE MESYLATE 0.5 MG PO TABS: 0.5000 mg | ORAL_TABLET | Freq: Two times a day (BID) | ORAL | 2 refills | Status: AC

## 2022-02-13 MED ORDER — ARIPIPRAZOLE 5 MG PO TABS: ORAL_TABLET | ORAL | 1 refills | Status: DC

## 2022-02-13 MED ORDER — RISPERIDONE 1 MG PO TABS: 1.0000 mg | ORAL_TABLET | Freq: Two times a day (BID) | ORAL | 0 refills | Status: DC

## 2022-02-13 NOTE — Progress Notes (Signed)
BH MD/PA/NP OP Progress Note  02/13/2022 1:10 PM Todd [name]  MRN:  [id number]  Chief Complaint:  Chief Complaint  Patient presents with   Medication Management    F/U MM   HPI:   Todd [name] is a 26 year old male with a past psychiatric history significant for paranoid schizophrenia, and major depressive disorder who presents to [facility] for follow-up and medication management.  Patient is currently being managed on the following medications:  Risperidone 2 mg 2 times daily Sertraline 50 mg daily  Patient reports no major issues or concerns regarding his current medication regimen.  He does describe experiencing a bad aftertaste when taking his risperidone.  Patient states that the aftertaste occurs randomly once he has taken the medication.  Patient states that he occasionally takes his medication with water, milk, or coffee.  Patient states that he has also been noticing some weight gain.  He reports that he works out 2-3 times a week along with walking.  He also states that his diet consists of salads with pasta.  Patient would like to be placed on a medication that does not cause much weight gain.  Patient denies experiencing any depressive symptoms.  He also denies any major changes in his mood and further denies bizarre behavior.  Patient also denies anxiety. A PHQ-9 screen was performed with the patient scoring a 9.  A GAD-7 screen was also performed with the patient scoring a 4.  Patient is alert and oriented x4, calm, cooperative, and fully engaged in conversation during the encounter.  Patient endorses being annoyed due to having to go to work and having to attend this appointment.  Patient denied suicidal or homicidal ideations.  He further denies auditory or visual hallucinations and does not appear to be responding to internal/external stimuli.  Patient endorses good sleep and receives on average 7 to 8  hours of sleep each night.  Patient endorses good appetite and eats on average 2 meals per day.  Patient denies alcohol consumption, tobacco use, and illicit drug use.  Visit Diagnosis:    ICD-10-CM   1. Paranoid schizophrenia (HCC)  F20.0 risperiDONE (RISPERDAL) 1 MG tablet    ARIPiprazole (ABILIFY) 5 MG tablet    benztropine (COGENTIN) 0.5 MG tablet    2. Mild episode of recurrent major depressive disorder (HCC)  F33.0 sertraline (ZOLOFT) 50 MG tablet      Past Psychiatric History:  Paranoid schizophrenia Major depressive disorder  Past Medical History:  Past Medical History:  Diagnosis Date   Bipolar 1 disorder (HCC)    Headache    History reviewed. No pertinent surgical history.  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  3 beers and 4 shots of Tequila over 2 weekends per month   Drug use: Not Currently    Types: Marijuana    Comment: Marijuana:  starts and stops.  When smoking, daily.  Tested every 2 weeks due to taking prescription drug abuse--[place] in [place].  Goes throught 05/2016. Has used MJ since 6th grade.  Went to [employer]--smoked a lot daily.   Sexual activity: Not Currently    Partners:  Female    Birth control/protection: Condom  Other Topics Concern   Not on file  Social History Narrative   Originally [place]   [place] 26 [place]     Lives with parents and 26 yo sister.    Social Determinants of Health   Financial Resource Strain: Not on file  Food Insecurity: Not on file  Transportation Needs: Not on file  Physical Activity: Not on file  Stress: Not on file  Social Connections: Not on file    Allergies: No Known Allergies  Metabolic Disorder Labs: Lab Results  Component Value Date   HGBA1C 5.5 12/05/2020   MPG 111.15 12/05/2020   MPG 108 06/01/2017   Lab Results  Component Value Date   PROLACTIN 31.5 (H) 06/01/2017   PROLACTIN 34.6 (H) 03/17/2016   Lab Results  Component Value Date   CHOL 188 12/05/2020   TRIG 137 12/05/2020   HDL 31 (L) 12/05/2020   CHOLHDL 6.1 12/05/2020   VLDL 27 12/05/2020   LDLCALC 130 (H) 12/05/2020   LDLCALC 131 (H) 06/01/2017   Lab Results  Component Value Date   TSH 4.558 (H) 12/05/2020   TSH 3.342 06/01/2017    Therapeutic Level Labs: No results found for: LITHIUM No results found for: VALPROATE No components found for:  CBMZ  Current Medications: Current Outpatient Medications  Medication Sig Dispense Refill   ARIPiprazole (ABILIFY) 5 MG tablet Take 0.5 tablets (2.5 mg total) by mouth daily for 6 days, THEN 1 tablet (5 mg total) daily. 30 tablet 1   benztropine (COGENTIN) 0.5 MG tablet Take 1 tablet (0.5 mg total) by mouth 2 (two) times daily. 60 tablet 2   risperiDONE (RISPERDAL) 1 MG tablet Take 1 tablet (1 mg total) by mouth 2 (two) times daily. Patient to discontinue medication after 7 days. 14 tablet 0   sertraline (ZOLOFT) 50 MG tablet Take 1 tablet (50 mg total) by mouth daily. 30 tablet 2   No current facility-administered medications for this visit.     Musculoskeletal: Strength & Muscle Tone: within normal limits Gait & Station: normal Patient leans: N/A  Psychiatric  Specialty Exam: Review of Systems  Psychiatric/Behavioral:  Negative for decreased concentration, dysphoric mood, hallucinations, self-injury, sleep disturbance and suicidal ideas. The patient is not nervous/anxious and is not hyperactive.    Blood pressure (!) 106/54, pulse 71, height 5\' 5"  (1.651 m), weight 231 lb (104.8 kg).Body mass index is 38.44 kg/m.  General Appearance: Well Groomed  Eye Contact:  Good  Speech:  Clear and Coherent and Normal Rate  Volume:  Normal  Mood:  Euthymic  Affect:  Appropriate  Thought Process:  Coherent, Goal Directed, and Descriptions of Associations: Intact  Orientation:  Full (Time, Place, and Person)  Thought Content: WDL   Suicidal Thoughts:  No  Homicidal Thoughts:  No  Memory:  Immediate;   Fair Recent;   Fair Remote;   Fair  Judgement:  Fair  Insight:  Fair  Psychomotor Activity:  Normal  Concentration:  Concentration: Good and Attention Span: Good  Recall:  Good  Fund of Knowledge: Fair  Language: Good  Akathisia:  No  Handed:  Right  AIMS (if indicated): not done  Assets:  Engineer, maintenance Social Support Vocational/Educational  ADL's:  Intact  Cognition: WNL  Sleep:  Good   Screenings: AIMS    Flowsheet Row [facility]) [facility] (Discharged) from [facility] (Discharged) from 03/16/2016 in [facility]  AIMS Total Score 0 0 0 0      AUDIT    Flowsheet Row [facility]) [facility] (Discharged) from [facility] (Discharged[facility] from 03/16/2016 in  [facility]  Alcohol Use Disorder Identification Test Final Score (AUDIT) 0 0 0 6      GAD-7    Flowsheet Row Office Visit from 02/13/2022 in [facility] Office Visit from 11/14/2021 in [facility] Office Visit from 08/15/2021 in [facility] Office Visit from 06/13/2021 in [facility] Clinical Support from 04/11/2021 in [facility]  Total GAD-7 Score 4 0 0 0 0      PHQ2-9    Flowsheet Row Office Visit from 02/13/2022 in [facility] Office Visit from 11/14/2021 in [facility] Office Visit from 08/15/2021 in [facility] Office Visit from 06/13/2021 in [facility] Clinical Support from 04/11/2021 in [facility]  PHQ-2 Total Score 2 0 0 2 0  PHQ-9 Total Score 9 -- -- 14 --      Flowsheet Row Office Visit from 02/13/2022 in [facility] Office Visit from 11/14/2021 in [facility] Office Visit from 08/15/2021 in [facility]  C-SSRS RISK CATEGORY No Risk No Risk No Risk        Assessment and Plan:   Todd [name] is a 26 year old male with a past psychiatric history significant for paranoid schizophrenia, and major depressive disorder who presents to [facility] for follow-up and medication management.  Patient denies depressive symptoms or anxiety and further denies changes in mood or bizarre behavior.  He does report weight gain associated to risperidone use.  Patient is interested in being placed on another medication that does not cause much weight gain.  Patient was recommended Abilify 2.5 mg for 6 days followed by 5 mg daily for the management of his  paranoid schizophrenia.  While taking his 2.5 mg dose of Abilify, patient to take risperidone 1 mg 2 times daily for 7 days before discontinuing.  Patient was informed that tapering instructions would be on the bottles of his medications.  Patient vocalized understanding.  Before concluding the encounter, patient expressed wanting to discontinue his medications due to wanting to manage his health naturally.  Provider informed patient that if he were to discontinue taking his medications, then his previously managed symptoms and resurface.  Patient agreed to taking his medications until the next encounter.  Patient's medications to be e-prescribed to pharmacy of choice.  Collaboration of Care: Collaboration of Care: Medication Management AEB mother managing patient's psychiatric medications and Psychiatrist AEB patient being seen a mental health provider  Patient/Guardian was advised Release of Information must be obtained prior to any record release in order to collaborate their care with an outside provider. Patient/Guardian was advised if they have not already done so to contact the registration department to sign all necessary forms in order for [place] to release information regarding their care.   Consent: Patient/Guardian gives verbal consent for treatment and assignment of benefits for services provided during this visit. Patient/Guardian expressed understanding and agreed to proceed.   1. Paranoid schizophrenia (HCC)  - risperiDONE (RISPERDAL) 1 MG tablet; Take 1 tablet (1 mg total) by mouth 2 (two) times daily. Patient to discontinue medication after 7 days.  Dispense: 14 tablet; Refill: 0 - ARIPiprazole (ABILIFY) 5 MG tablet; Take 0.5 tablets (2.5 mg total) by mouth daily for 6 days, THEN 1 tablet (5 mg total) daily.  Dispense: 30 tablet; Refill: 1 - benztropine (COGENTIN) 0.5 MG tablet; Take 1 tablet (0.5 mg total) by mouth 2 (two) times daily.  Dispense: 60 tablet; Refill: 2  2. Mild episode of  recurrent major depressive disorder (HCC)  - sertraline (ZOLOFT) 50 MG tablet; Take 1 tablet (50 mg total) by mouth daily.  Dispense: 30 tablet; Refill: 2  Patient to follow up in 2 months Provider spent a total of 26 minutes with the patient/reviewing patient's chart  Meta Hatchet, PA 02/13/2022, 1:10 PM

## 2022-04-10 ENCOUNTER — Encounter (HOSPITAL_COMMUNITY): Payer: Self-pay | Admitting: Physician Assistant

## 2022-04-10 ENCOUNTER — Ambulatory Visit (INDEPENDENT_AMBULATORY_CARE_PROVIDER_SITE_OTHER): Payer: No Payment, Other | Admitting: Physician Assistant

## 2022-04-10 VITALS — BP 123/64 | HR 75 | Ht 65.0 in | Wt 228.0 lb

## 2022-04-10 DIAGNOSIS — F200 Paranoid schizophrenia: Secondary | ICD-10-CM

## 2022-04-10 DIAGNOSIS — F330 Major depressive disorder, recurrent, mild: Secondary | ICD-10-CM

## 2022-04-12 ENCOUNTER — Encounter (HOSPITAL_COMMUNITY): Payer: Self-pay | Admitting: Physician Assistant

## 2022-04-12 DIAGNOSIS — F330 Major depressive disorder, recurrent, mild: Secondary | ICD-10-CM | POA: Insufficient documentation

## 2022-04-12 MED ORDER — RISPERIDONE 0.5 MG PO TABS: ORAL_TABLET | ORAL | 0 refills | Status: AC

## 2022-04-12 NOTE — Progress Notes (Signed)
BH MD/PA/NP OP Progress Note ? ?04/12/2022 1:50 PM ?Todd [name]  ?MRN:  [id number] ? ?Chief Complaint:  ?Chief Complaint  ?Patient presents with  ? Injections  ? ?HPI:  ? ?Todd [name] is a 26 year old male with a past psychiatric history significant for paranoid schizophrenia and major depressive disorder who presents to [facility] for follow-up and medication management.  Patient is currently being managed on the following medications: ? ?Abilify 5 mg daily ?Sertraline 50 mg daily ? ?Before starting the encounter, provider asked how the patient was doing on his medication.  Patient reports that he had finished Abilify completely and never picked up the refill.  Patient denied experiencing any beneficial management of his symptoms when taking Abilify.  Patient reports that he still had some pills left over from risperidone and started taking his previous prescriptions.  Patient reports no major issues when taking risperidone; however, patient believes that the patient medication is getting "heavy."  States that the medication is getting to him in a negative way and believes that his depression is worsening due to having to take risperidone.  Patient also notes that he dislikes the aftertaste he experiences when taking risperidone.  He reports that the aftertaste of risperidone occurred was randomly during the day.  Patient has tried taking medication with water,, and juice but still experiences the horrible aftertaste. ? ?Patient denies depressive symptoms at this time.  Patient also denies anxiety and denies any new stressors at this time.  Patient expresses that he wants to discontinue taking risperidone and sertraline altogether believing that he does not need to be on medications. ? ?Patient is alert and oriented x4, calm, cooperative, and fully engaged in conversation during the encounter.  Patient endorses okay mood.  Patient denies  suicidal or homicidal ideations.  He further denies auditory or visual hallucinations and does not appear to be responding to internal/external stimuli.  Patient endorses good sleep. Patient endorses good appetite and eats on average 2 meals per day.  Patient denies alcohol consumption, tobacco use, and illicit drug use. ? ?Visit Diagnosis:  ?No diagnosis found. ? ? ?Past Psychiatric History:  ?Paranoid schizophrenia ?Major depressive disorder ? ?Past Medical History:  ?Past Medical History:  ?Diagnosis Date  ? Bipolar 1 disorder ([place])   ? Headache   ? No past surgical history on file. ? ?Family Psychiatric History:  ?Patient reports family history of psychiatric illness ? ?Family History:  ?Family History  ?Problem Relation Age of Onset  ? Hyperlipidemia Father   ? Diabetes Maternal Grandmother   ? Alcohol abuse Paternal Grandfather   ? ? ?Social History:  ?Social History  ? ?Socioeconomic History  ? Marital status: Single  ?  Spouse name: Not on file  ? Number of children: 0  ? Years of education: Not on file  ? Highest education level: 12th grade  ?Occupational History  ? 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 Smoking status: Former  ?  Packs/day: 0.00  ?  Types: Cigarettes  ? Smokeless tobacco: Never  ?Vaping Use  ? Vaping Use: Never used  ?Substance and Sexual Activity  ? Alcohol use: Not Currently  ?  Alcohol/week: 0.0 standard drinks  ?  Comment: Beer and Tequila on weekends.  3 beers and 4 shots of Tequila over 2 weekends per month  ? Drug use: Not Currently  ?  Types: Marijuana  ?  Comment: Marijuana:  starts and stops.  When  smoking, daily.  Tested every 2 weeks due to taking prescription drug abuse--stopped in [place].  Goes throught 05/2016. Has used MJ since 6th grade.  Went to [employer] a lot daily.  ? Sexual activity: Not Currently  ?  Partners: Female  ?  Birth control/protection: Condom  ?Other Topics Concern  ? Not on file  ?Social History Narrative  ? Originally  from [place]  ? Came to [place]. When 26 years old.    ? Lives with parents and 35 yo sister.   ? ?Social Determinants of Health  ? ?Financial Resource Strain: Not on file  ?Food Insecurity: Not on file  ?Transportation Needs: Not on file  ?Physical Activity: Not on file  ?Stress: Not on file  ?Social Connections: Not on file  ? ? ?Allergies: No Known Allergies ? ?Metabolic Disorder Labs: ?Lab Results  ?Component Value Date  ? HGBA1C 5.5 12/05/2020  ? MPG 111.15 12/05/2020  ? MPG 108 06/01/2017  ? ?Lab Results  ?Component Value Date  ? PROLACTIN 31.5 (H) 06/01/2017  ? PROLACTIN 34.6 (H) 03/17/2016  ? ?Lab Results  ?Component Value Date  ? CHOL 188 12/05/2020  ? TRIG 137 12/05/2020  ? HDL 31 (L) 12/05/2020  ? CHOLHDL 6.1 12/05/2020  ? VLDL 27 12/05/2020  ? LDLCALC 130 (H) 12/05/2020  ? LDLCALC 131 (H) 06/01/2017  ? ?Lab Results  ?Component Value Date  ? TSH 4.558 (H) 12/05/2020  ? TSH 3.342 06/01/2017  ? ? ?Therapeutic Level Labs: ?No results found for: LITHIUM ?No results found for: VALPROATE ?No components found for:  CBMZ ? ?Current Medications: ?Current Outpatient Medications  ?Medication Sig Dispense Refill  ? ARIPiprazole (ABILIFY) 5 MG tablet Take 0.5 tablets (2.5 mg total) by mouth daily for 6 days, THEN 1 tablet (5 mg total) daily. 30 tablet 1  ? benztropine (COGENTIN) 0.5 MG tablet Take 1 tablet (0.5 mg total) by mouth 2 (two) times daily. 60 tablet 2  ? risperiDONE (RISPERDAL) 1 MG tablet Take 1 tablet (1 mg total) by mouth 2 (two) times daily. Patient to discontinue medication after 7 days. 14 tablet 0  ? sertraline (ZOLOFT) 50 MG tablet Take 1 tablet (50 mg total) by mouth daily. 30 tablet 2  ? ?No current facility-administered medications for this visit.  ? ? ? ?Musculoskeletal: ?Strength & Muscle Tone: within normal limits ?Gait & Station: normal ?Patient leans: N/A ? ?Psychiatric Specialty Exam: ?Review of Systems  ?Psychiatric/Behavioral:  Negative for decreased concentration, dysphoric mood,  hallucinations, self-injury, sleep disturbance and suicidal ideas. The patient is not nervous/anxious and is not hyperactive.    ?Blood pressure 123/64, pulse 75, height 5\' 5"  (1.651 m), weight 228 lb (103.4 kg), SpO2 98 %.Body mass index is 37.94 kg/m?.  ?General Appearance: Well Groomed  ?Eye Contact:  Good  ?Speech:  Clear and Coherent and Normal Rate  ?Volume:  Normal  ?Mood:  Euthymic  ?Affect:  Appropriate  ?Thought Process:  Coherent, Goal Directed, and Descriptions of Associations: Intact  ?Orientation:  Full (Time, Place, and Person)  ?Thought Content: WDL   ?Suicidal Thoughts:  No  ?Homicidal Thoughts:  No  ?Memory:  Immediate;   Fair ?Recent;   Fair ?Remote;   Fair  ?Judgement:  Fair  ?Insight:  Fair  ?Psychomotor Activity:  Normal  ?Concentration:  Concentration: Good and Attention Span: Good  ?Recall:  Good  ?Fund of Knowledge: Fair  ?Language: Good  ?Akathisia:  No  ?Handed:  Right  ?AIMS (if indicated): not done  ?  Assets:  Communication Skills ?Housing ?Social Support ?Vocational/Educational  ?ADL's:  Intact  ?Cognition: WNL  ?Sleep:  Good  ? ?Screenings: ?AIMS   ? ?Flowsheet Row Admission (Discharged) from 04/06/2019 in [place] [facility] Admission (Discharged) from 03/19/2019 in [place] [facility] Admission (Discharged) from OP Visit from 05/31/2017 in [place] [facility] Admission (Discharged) from 03/16/2016 in [place]  ?AIMS Total Score 0 0 0 0  ? ?  ? ?AUDIT   ? ?Flowsheet Row Admission (Discharged) from 04/06/2019 in [place] [facility] Admission (Discharged) from 03/19/2019 in [place] [facility] Admission (Discharged) from OP Visit from 05/31/2017 in [place] [facility] Admission (Discharged) from 03/16/2016 in [place]  ?Alcohol Use Disorder Identification Test Final Score (AUDIT) 0 0 0 6  ? ?   ? ?GAD-7   ? ?[place] Office Visit from 04/10/2022 in [facility] Office Visit from 02/13/2022 in [facility] Office Visit from 11/14/2021 in [place]

## 2022-05-29 ENCOUNTER — Encounter (HOSPITAL_COMMUNITY): Payer: No Payment, Other | Admitting: Physician Assistant

## 2022-05-30 ENCOUNTER — Encounter (HOSPITAL_COMMUNITY): Payer: Self-pay | Admitting: Physician Assistant

## 2022-05-30 ENCOUNTER — Ambulatory Visit (INDEPENDENT_AMBULATORY_CARE_PROVIDER_SITE_OTHER): Payer: No Payment, Other | Admitting: Physician Assistant

## 2022-05-30 VITALS — BP 118/60 | HR 81 | Ht 65.0 in | Wt 218.0 lb

## 2022-05-30 DIAGNOSIS — F200 Paranoid schizophrenia: Secondary | ICD-10-CM | POA: Diagnosis not present

## 2022-06-02 ENCOUNTER — Encounter (HOSPITAL_COMMUNITY): Payer: Self-pay | Admitting: Physician Assistant

## 2022-06-02 NOTE — Progress Notes (Addendum)
BH MD/PA/NP OP Progress Note  06/02/2022 8:21 PM Todd [name]  MRN:  [id number]  Chief Complaint:  Chief Complaint  Patient presents with   Medication Management   HPI:   Todd [name] is a 26 year old male with a past psychiatric history significant for paranoid schizophrenia and major depressive disorder who presents to [facility] for follow-up and medication management.  Patient is currently not taking any medications due to the police that he does not need to take his medications anymore.  Patient was able to successfully taper off risperidone in a span of 3 weeks.  Patient was also able to taper off his sertraline.  Patient denies any issues from tapering off his medications.  He denies experiencing any symptoms related to his psychiatric diagnoses.  Patient denies depression or anxiety.  He further denies experiencing any bizarre behaviors and denies anyone in his immediate circle witnessing any observed behavioral disturbances.  Patient denies paranoia and denies any other stressors at this time.  Patient is alert and oriented x4, calm, cooperative, and fully engaged in conversation during the encounter.  Patient endorses good [name].  Patient denies suicidal or homicidal ideations.  He further denies auditory or visual hallucinations and does not appear to be responding to any life.  Patient endorses receiving enough sleep.  Patient endorses normal appetite.  Patient denies alcohol consumption, tobacco use, and illicit drug use.  Visit Diagnosis:  No diagnosis found.   Past Psychiatric History:  Paranoid schizophrenia Major depressive disorder  Past Medical History:  Past Medical History:  Diagnosis Date   Bipolar 1 disorder (HCC)    Headache    No past surgical history on file.  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  3 beers and 4 shots of Tequila over 2 weekends per month   Drug use: Not Currently    Types: Marijuana    Comment: Marijuana:  starts and stops.  When smoking, daily.  Tested every 2 weeks due to taking prescription drug abuse--stopped in [place].  Goes throught 05/2016. Has used MJ since 6th grade.  Went to [employer] a lot daily.   Sexual activity: Not Currently    Partners: Female    Birth control/protection: Condom  Other Topics Concern   Not on file  Social History Narrative   Originally from [place]   Came to [employer]. When 26 years old.     Lives with parents and 67 yo sister.    Social Determinants of Health   Financial Resource Strain: Not on file  Food Insecurity: Not on file  Transportation Needs: Not on file  Physical Activity: Not on file  Stress: Not on file  Social Connections: Not on file    Allergies: No Known Allergies  Metabolic Disorder Labs: Lab Results  Component Value Date   HGBA1C 5.5 12/05/2020   MPG 111.15 12/05/2020   MPG 108 06/01/2017   Lab Results  Component Value Date   PROLACTIN 31.5 (H) 06/01/2017   PROLACTIN 34.6 (H) 03/17/2016   Lab Results  Component Value Date   CHOL 188 12/05/2020   TRIG 137 12/05/2020   HDL 31 (L) 12/05/2020    CHOLHDL 6.1 12/05/2020   VLDL 27 12/05/2020   LDLCALC 130 (H) 12/05/2020   LDLCALC 131 (H) 06/01/2017   Lab Results  Component Value Date   TSH 4.558 (H) 12/05/2020   TSH 3.342 06/01/2017    Therapeutic Level Labs: No results found for: "LITHIUM" No results found for: "VALPROATE" No results found for: "CBMZ"  Current Medications: Current Outpatient Medications  Medication Sig Dispense Refill   benztropine (COGENTIN) 0.5 MG tablet Take 1 tablet (0.5 mg total) by mouth 2 (two) times daily. 60 tablet 2   risperiDONE (RISPERDAL) 0.5 MG tablet Patient to discontinue medication after 7 days. 7 tablet 0   sertraline (ZOLOFT) 50 MG tablet Take 1 tablet (50 mg total) by mouth daily. 30 tablet 2   No current facility-administered medications for this visit.     Musculoskeletal: Strength & Muscle Tone: within normal limits Gait & Station: normal Patient leans: N/A  Psychiatric Specialty Exam: Review of Systems  Psychiatric/Behavioral:  Negative for decreased concentration, dysphoric [name], hallucinations, self-injury, sleep disturbance and suicidal ideas. The patient is not nervous/anxious and is not hyperactive.     Blood pressure 118/60, pulse 81, height 5\' 5"  (1.651 m), weight 218 lb (98.9 kg), SpO2 100 %.Body mass index is 36.28 kg/m.  General Appearance: Well Groomed  Eye Contact:  Good  Speech:  Clear and Coherent and Normal Rate  Volume:  Normal  [name]:  Euthymic  Affect:  Appropriate  Thought Process:  Coherent, Goal Directed, and Descriptions of Associations: Intact  Orientation:  Full (Time, Place, and Person)  Thought Content: WDL   Suicidal Thoughts:  No  Homicidal Thoughts:  No  Memory:  Immediate;   Fair Recent;   Fair Remote;   Fair  Judgement:  Fair  Insight:  Fair  Psychomotor Activity:  Normal  Concentration:  Concentration: Good and Attention Span: Good  Recall:  Good  Fund of Knowledge: Fair  Language: Good  Akathisia:  No  Handed:  Right  AIMS (if  indicated): not done  Assets:  Communication Skills Housing Social Support Vocational/Educational  ADL's:  Intact  Cognition: WNL  Sleep:  Good   Screenings: AIMS    Flowsheet Row [facility] (Discharged) from [facility] (Discharged) from 03/16/2016 in [facility]  AIMS Total Score 0 0 0 0      AUDIT    Flowsheet Row [facility]) [facility] (Dis[facility] Ad[facility] (Discharged) from OP [facility] Ad[facility] (Discharged) from 03/16/2016 in [facility]IORAL MEDICINE  Alcohol Use Disorder Identification Test Final Score (AUDIT) 0 0 0 6  [place] Office Visit from 04/10/2022 in [facility] Office Visit from 02/13/2022 in [facility] Office Visit from [facility] from 08/15/2021 in [facility] Office Visit from 06/13/2021 in [facility]  Total GAD-7 Score 0 4 0 0 0      PHQ2-9    [place] Office Visit from 04/10/2022 in [facility] Office Visit from 02/13/2022 in [facility] Office Visit from 11/14/2021 in [facility] Office Visit from 08/15/2021 in [facility] Office Visit from 06/13/2021 in [facility]  PHQ-2 Total Score 0 2 0 0 2  PHQ-9 Total Score -- 9 -- -- [place] Office Visit from  04/10/2022 in [facility] Office Visit from 02/13/2022 in [facility] Office Visit from 11/14/2021 in [place] No Risk No Risk No Risk        Assessment and Plan:   Marsean Hoeger is a 26 year old male with a past psychiatric history significant for paranoid schizophrenia and major depressive disorder who presents to [facility] for follow-up and medication management.  Patient has successfully tapered off his medications without issue.  Patient denies experiencing depressive episodes, anxiety, auditory/visual hallucinations, or paranoia.  Patient also denies people in his immediate circle observing any abnormal behavior within him.  Patient would like to continue not taking medications for his diagnoses.  Patient agreed to follow up to assess for stability while off his meds patient's.   Collaboration of Care: Collaboration of Care: Medication Management AEB mother managing patient's psychiatric medications, Psychiatrist AEB patient being seen a mental health provider, and Patient refused AEB patient wanting to discontinue taking his medications.  Patient/Guardian was advised Release of Information must be obtained prior to any record release in order to collaborate their care with an outside provider. Patient/Guardian was advised if they have not already done so to contact the registration department to sign all necessary forms in order for [place] to release information regarding their care.   Consent: Patient/Guardian gives verbal consent for treatment and assignment of benefits for services provided during this visit. Patient/Guardian expressed understanding and agreed to proceed.   1. Paranoid schizophrenia ([place])   Patient to follow up in 2 months Provider spent a total of 14 minutes with the patient/reviewing patient's chart  Todd Mood, PA 06/02/2022, 8:21 PM

## 2022-08-07 ENCOUNTER — Encounter (HOSPITAL_COMMUNITY): Payer: No Payment, Other | Admitting: Physician Assistant
